# Patient Record
Sex: Female | Born: 1940
Health system: Southern US, Community
[De-identification: ages and names within clinical notes are randomized; demographics above are authoritative.]

## PROBLEM LIST (undated history)

## (undated) DIAGNOSIS — K2210 Ulcer of esophagus without bleeding: Secondary | ICD-10-CM

## (undated) DIAGNOSIS — I10 Essential (primary) hypertension: Secondary | ICD-10-CM

## (undated) DIAGNOSIS — M1990 Unspecified osteoarthritis, unspecified site: Secondary | ICD-10-CM

## (undated) DIAGNOSIS — K219 Gastro-esophageal reflux disease without esophagitis: Secondary | ICD-10-CM

## (undated) DIAGNOSIS — K648 Other hemorrhoids: Secondary | ICD-10-CM

## (undated) HISTORY — DX: Ulcer of esophagus without bleeding: K22.10

## (undated) HISTORY — DX: Other hemorrhoids: K64.8

## (undated) HISTORY — PX: KIDNEY STONE SURGERY: SHX686

## (undated) HISTORY — PX: BUNIONECTOMY: SHX129

## (undated) HISTORY — DX: Essential (primary) hypertension: I10

## (undated) HISTORY — PX: APPENDECTOMY: SHX54

## (undated) HISTORY — PX: HEMORRHOID SURGERY: SHX153

## (undated) HISTORY — PX: SHOULDER SURGERY: SHX246

## (undated) HISTORY — DX: Gastro-esophageal reflux disease without esophagitis: K21.9

---

## 2001-01-02 ENCOUNTER — Emergency Department (HOSPITAL_COMMUNITY): Admission: EM | Admit: 2001-01-02 | Discharge: 2001-01-02 | Payer: Self-pay | Admitting: Emergency Medicine

## 2001-01-02 ENCOUNTER — Encounter: Payer: Self-pay | Admitting: Emergency Medicine

## 2002-02-24 ENCOUNTER — Encounter: Payer: Self-pay | Admitting: Internal Medicine

## 2002-02-24 ENCOUNTER — Ambulatory Visit (HOSPITAL_COMMUNITY): Admission: RE | Admit: 2002-02-24 | Discharge: 2002-02-24 | Payer: Self-pay | Admitting: Internal Medicine

## 2002-12-02 DIAGNOSIS — K648 Other hemorrhoids: Secondary | ICD-10-CM

## 2002-12-02 HISTORY — DX: Other hemorrhoids: K64.8

## 2002-12-08 ENCOUNTER — Ambulatory Visit (HOSPITAL_COMMUNITY): Admission: RE | Admit: 2002-12-08 | Discharge: 2002-12-08 | Payer: Self-pay | Admitting: Internal Medicine

## 2002-12-08 HISTORY — PX: COLONOSCOPY W/ BIOPSIES: SHX1374

## 2003-02-16 ENCOUNTER — Ambulatory Visit (HOSPITAL_COMMUNITY): Admission: RE | Admit: 2003-02-16 | Discharge: 2003-02-16 | Payer: Self-pay | Admitting: Internal Medicine

## 2003-02-16 ENCOUNTER — Encounter: Payer: Self-pay | Admitting: Internal Medicine

## 2003-03-07 ENCOUNTER — Ambulatory Visit (HOSPITAL_COMMUNITY): Admission: RE | Admit: 2003-03-07 | Discharge: 2003-03-07 | Payer: Self-pay | Admitting: Internal Medicine

## 2003-03-07 ENCOUNTER — Encounter: Payer: Self-pay | Admitting: Internal Medicine

## 2004-03-18 ENCOUNTER — Ambulatory Visit (HOSPITAL_COMMUNITY): Admission: RE | Admit: 2004-03-18 | Discharge: 2004-03-18 | Payer: Self-pay | Admitting: Internal Medicine

## 2005-03-24 ENCOUNTER — Ambulatory Visit (HOSPITAL_COMMUNITY): Admission: RE | Admit: 2005-03-24 | Discharge: 2005-03-24 | Payer: Self-pay | Admitting: Internal Medicine

## 2005-03-27 ENCOUNTER — Ambulatory Visit (HOSPITAL_COMMUNITY): Admission: RE | Admit: 2005-03-27 | Discharge: 2005-03-27 | Payer: Self-pay | Admitting: Internal Medicine

## 2006-03-31 ENCOUNTER — Ambulatory Visit (HOSPITAL_COMMUNITY): Admission: RE | Admit: 2006-03-31 | Discharge: 2006-03-31 | Payer: Self-pay | Admitting: Internal Medicine

## 2007-04-13 ENCOUNTER — Ambulatory Visit (HOSPITAL_COMMUNITY): Admission: RE | Admit: 2007-04-13 | Discharge: 2007-04-13 | Payer: Self-pay | Admitting: Internal Medicine

## 2007-04-21 ENCOUNTER — Ambulatory Visit: Payer: Self-pay | Admitting: Internal Medicine

## 2007-04-28 ENCOUNTER — Ambulatory Visit: Payer: Self-pay | Admitting: Internal Medicine

## 2007-04-28 ENCOUNTER — Ambulatory Visit (HOSPITAL_COMMUNITY): Admission: RE | Admit: 2007-04-28 | Discharge: 2007-04-28 | Payer: Self-pay | Admitting: Internal Medicine

## 2007-04-28 HISTORY — PX: ESOPHAGOGASTRODUODENOSCOPY: SHX1529

## 2007-05-05 ENCOUNTER — Ambulatory Visit (HOSPITAL_COMMUNITY): Admission: RE | Admit: 2007-05-05 | Discharge: 2007-05-05 | Payer: Self-pay | Admitting: Internal Medicine

## 2007-08-10 ENCOUNTER — Ambulatory Visit: Payer: Self-pay | Admitting: Internal Medicine

## 2008-05-16 ENCOUNTER — Ambulatory Visit (HOSPITAL_COMMUNITY): Admission: RE | Admit: 2008-05-16 | Discharge: 2008-05-16 | Payer: Self-pay | Admitting: Internal Medicine

## 2008-06-07 ENCOUNTER — Ambulatory Visit: Payer: Self-pay | Admitting: Internal Medicine

## 2009-05-30 ENCOUNTER — Ambulatory Visit (HOSPITAL_COMMUNITY): Admission: RE | Admit: 2009-05-30 | Discharge: 2009-05-30 | Payer: Self-pay | Admitting: Internal Medicine

## 2009-06-26 ENCOUNTER — Encounter: Payer: Self-pay | Admitting: Urgent Care

## 2010-06-04 ENCOUNTER — Ambulatory Visit (HOSPITAL_COMMUNITY): Admission: RE | Admit: 2010-06-04 | Discharge: 2010-06-04 | Payer: Self-pay | Admitting: Internal Medicine

## 2010-06-18 ENCOUNTER — Encounter: Payer: Self-pay | Admitting: Urgent Care

## 2010-07-25 ENCOUNTER — Encounter (INDEPENDENT_AMBULATORY_CARE_PROVIDER_SITE_OTHER): Payer: Self-pay | Admitting: *Deleted

## 2010-08-14 ENCOUNTER — Ambulatory Visit: Payer: Self-pay | Admitting: Internal Medicine

## 2010-08-14 DIAGNOSIS — K219 Gastro-esophageal reflux disease without esophagitis: Secondary | ICD-10-CM | POA: Insufficient documentation

## 2010-08-20 DIAGNOSIS — Z860100 Personal history of colon polyps, unspecified: Secondary | ICD-10-CM | POA: Insufficient documentation

## 2010-10-14 ENCOUNTER — Encounter: Payer: Self-pay | Admitting: Internal Medicine

## 2010-10-22 NOTE — Letter (Signed)
Summary: Recall Office Visit  [facility]  [address]   [place], [place] [address]   Phone: [phone]  Fax: [phone]      July 25, 2010   TASHANNA DOLIN [address] [place], [place]  [address] 27-Feb-1941   Dear Ms. Arboleda,   According to our records, it is time for you to schedule a follow-up office visit with [place].   At your convenience, please call [phone] to schedule an office visit. If you have any questions, concerns, or feel that this letter is in error, we would appreciate your call.   Sincerely,    Diana Eves  [facility] Ph: [phone]   Fax: [phone]

## 2010-10-22 NOTE — Assessment & Plan Note (Signed)
Summary: FU/SS   Visit Type:  Follow-up Visit Primary Care Provider:  Ouida Sills  Chief Complaint:  F/U .  History of Present Illness:  70 year old lady here for followup. History of ulcerative reflux esophagitis. No Barrett's esophagus. Ran out of AcipHex 2-3 months ago. Called in for a followup appointment but did not let us know she was out of medication. She's been on Prilosec in the interim, symptoms not nearly as well-controlled on this agent. No dysphagia, melena early satiety nausea or vomiting. Her weight has been stable. She is due for a surveillance colonoscopy in 2014.  Current Medications (verified): 1)  Amlodipine Besylate 5 Mg Tabs (Amlodipine Besylate) .... Take 1 Tablet By Mouth Once A Day 2)  Mag Oxide .... Take 1 Tablet By Mouth Once A Day 3)  Vitamin E .... Take 1 Tablet By Mouth Once A Day 4)  Prilosec 20 Mg Cpdr (Omeprazole) .... Take 1 Tablet By Mouth Once A Day 5)  Lisinopril .... Take 1 Tablet By Mouth Once A Day 6)  Advil .... As Needed  Allergies (verified): 1)  ! Naprosyn 2)  ! * Feldine  Past History:  Family History: Last updated: 2010-08-25 Father: Deceased age 70    CVA/Pneumonia Mother: Deceased age 64 CAD Siblings: 3      one sister in nursing home/dementia  Social History: Last updated: 2010-08-25 Marital Status: Married Children: one Occupation: Retired    [employer]  Past Medical History: Hypertension  Reflux  Past Surgical History: Hemorrhoidectomy Appendectomy Kidney Stones Bilateral Bunion surgeries X 5 Left Shoulder Bone Spur  Family History: Father: Deceased age 32    CVA/Pneumonia Mother: Deceased age 87 CAD Siblings: 3      one sister in nursing home/dementia  Social History: Marital Status: Married Children: one Occupation: Retired    [employer]al architect [employer]  Vital Signs:  Patient profile:   70 year old female Height:      65.5 inches Weight:      180.50 pounds BMI:     29.69 Temp:     98.8 degrees F oral BP  sitting:   138 / 70  (left arm) Cuff size:   large  Vitals Entered By: Cloria Spring LPN (2010/08/25 1:59 PM)  Physical Exam  General:  pleasant alert lady resting comfortably Lungs:  clear to auscultation Heart:  regular rate rhythm without murmur gallop rub Abdomen:  flat positive bowel sounds soft, nontender without appreciable mass or organomegaly  Impression & Recommendations: Impression: 70 year old lady with history of complicated GERD with poor response to Prilosec in lieu of AcipHex. No alarm symptoms.  I feel we simply need to get her back on AcipHex.  Recommendations: Resume AcipHex 20 mg orally daily albuterol one year prescription and a prescription for 14 free tablets with a drug company coupon; reviewed antireflux lifestyle/diet.  We'll plan to see this nice lady back in one year. Tentatively plan for repeat colonoscopy 2014.  Appended Document: Orders Update    Clinical Lists Changes  Problems: Added new problem of GERD (ICD-530.81) Added new problem of COLONIC POLYPS, HX OF (ICD-V12.72) Orders: Added new Service order of Est. Patient Level III ([address]) - Signed

## 2010-10-22 NOTE — Medication Information (Signed)
Summary: ACIPHEX  ACIPHEX   Imported By: Rexene Alberts 06/18/2010 10:19:14  _____________________________________________________________________  External Attachment:    Type:   Image     Comment:   External Document  Appended Document: ACIPHEX Pt due for 13yr FU or can get from PCP.  Appended Document: ACIPHEX pharmacy informed  Appended Document: ACIPHEX Pt called to schedule but RMR is completely booked for Oct- need to call pt back when Nov schedule is open- cdg

## 2011-02-04 NOTE — Assessment & Plan Note (Signed)
Tricia Jenkins, Tricia Jenkins                 CHART#:  [id number]   DATE:  08/10/2007                       DOB:  15-Sep-1941   Followup EGD revealing mild rest reflux esophagitis and small hiatal  hernia, April 28, 2007.   Tricia Jenkins has done very well.  Continue on AcipHex and cutting back on  her junk food intake late in the evening.  She is not having any  dysphagia.  She is very happy with AcipHex 20 mg orally daily.  She has,  however, gained 7 pounds since I last saw her in the office, back in  July.  Overall, she is doing very well.  She had essentially negative  colonoscopy in 2004 and will be due for routine screening in 2014.   CURRENT MEDICATIONS:  See updated list.   ALLERGIES:  FELDENE, NAPROSYN.   EXAM TODAY:  She looks well.  Weight 187, height 5 feet 3, temperature 97.6, BP 140/98, pulse 76.  Detailed exam deferred.   ASSESSMENT:  History of erosive reflux esophagitis, much better on  aspiration therapy with AcipHex and dietary modification.  I told Ms.  Jenkins it would be a good idea if she could turn things around and  actually lose down into the low 170s between now and 12 months from now.  Anti-reflux diet emphasized.   We will plan to see the patient back tentatively in one year and p.r.n.       R. Roetta Sessions, M.D.  Electronically Signed     RMR/MEDQ  D:  08/10/2007  T:  08/10/2007  Job:  [id number]   cc:   Kingsley Callander. Ouida Sills, MD

## 2011-02-04 NOTE — Assessment & Plan Note (Signed)
Tricia Jenkins, Tricia Jenkins                 CHART#:  [id number]   DATE:  06/07/2008                       DOB:  03/06/1941   PRIMARY CARE PHYSICIAN:  Kingsley Callander. Ouida Sills, MD   PROBLEM LIST:  1. Complicated gastroesophageal reflux disease with history of erosive      reflux esophagitis and small hiatal hernia with last EGD by Dr.      Jena Gauss on April 28 2007.  2. Nephrolithiasis, status post surgery.  3. Status post appendectomy.  4. Status post hemorrhoidectomy.  5. A 2004 colonoscopy, petechial hemorrhage to the ileocecal valve and      benign polyp.   SUBJECTIVE:  The patient is here for a followup visit.  She is doing  very well on Aciphex 20 mg daily.  She denies any heartburn,  indigestion, nausea, vomiting, dysphagia, or odynophagia.  Denies  anorexia.  Denies any abdominal pain, rectal bleeding, or melena.  She  rarely has breakthrough heartburn and indigestion and if she does it is  usually related to her nighttime snacking.  She tends to eat junk food  just prior to going to bed.  She is scheduled for complete physical exam  by Dr. Ouida Sills next month.   CURRENT MEDICATIONS:  See the list of June 07, 2008.   ALLERGIES:  Feldene and Naprosyn.   OBJECTIVE:  VITAL SIGNS:  Weight 180 pounds, height 6 feet and 5-1/2  inches, temperature 98, blood pressure 150/80, and pulse 68.  GENERAL:  The patient is a well-developed, well-nourished Caucasian  female, who is alert, oriented, pleasant, and cooperative in no acute  distress.  HEENT:  Sclerae clear, nonicteric.  Conjunctivae pink.  Oropharynx pink  and moist without any lesions.  CHEST:  Heart, regular rate and rhythm.  Normal S1 and S2.  ABDOMEN:  Protuberant with positive bowel sounds x4.  No bruits  auscultated.  Soft, nontender, and nondistended without palpable mass or  hepatosplenomegaly.  No rebound, tenderness, or guarding.  EXTREMITIES:  Without clubbing or edema.   ASSESSMENT:  History of erosive reflux esophagitis/chronic  gastroesophageal reflux disease, well controlled on PPI.   PLAN:  1. Continue Aciphex 20 mg daily, #31 with 11 refills.  2. Colonoscopy in March 2014 or sooner if she has any problems.  3. Office visit in 2 years with Dr. Jena Gauss.       Lorenza Burton, N.P.  Electronically Signed     R. Roetta Sessions, M.D.  Electronically Signed    KJ/MEDQ  D:  06/08/2008  T:  06/08/2008  Job:  [id number]   cc:   Kingsley Callander. Ouida Sills, MD

## 2011-02-04 NOTE — Op Note (Signed)
NAMEMEGHAM, DWYER                ACCOUNT NO.:  [id number]   MEDICAL RECORD NO.:  [id number]          PATIENT TYPE:  AMB   LOCATION:  DAY                           FACILITY:  [facility]   PHYSICIAN:  R. Roetta Sessions, M.D. DATE OF BIRTH:  1941-05-12   DATE OF PROCEDURE:  04/28/2007  DATE OF DISCHARGE:                               OPERATIVE REPORT   PROCEDURE PERFORMED:  Diagnostic esophagogastroduodenoscopy.   INDICATIONS FOR PROCEDURE:  70 year old lady with refractory  gastroesophageal reflux disease symptoms historically well controlled on  Prilosec.  She had refractory symptoms recently.  I saw her in our  office on April 21, 2007, and started her on some AcipHex 20 mg orally  daily.  This has been associated with marked improvement in her  symptoms.  She has no symptoms consistent with GI bleeding and has not  had any odynophagia or dysphagia.  EGD is now being done to further  evaluate her symptoms.  This approach has been discussed with the  patient at length.  The potential risks, benefits and alternatives have  been reviewed and questions answered.  Please see documentation in the  medical record.   PROCEDURE NOTE:  O2 saturation, blood pressure, pulse, and respirations  were monitored throughout the entire procedure.  Conscious sedation with  Versed 4 mg IV and Demerol 75 mg IV in divided doses.  Pentax video chip  system.  Cetacaine spray for topical oropharyngeal anesthesia.   FINDINGS:  Examination of the tubular esophagus revealed a couple of  distal esophageal erosions.  There was no Barrett's esophagus or other  abnormality.  The EG junction was easily traversed (erosions were small,  no more than 6-7 mm in length).  The gastric cavity was empty and  insufflated well with air.  A thorough examination of the gastric mucosa  including retroflexion of the proximal stomach esophagogastric junction  demonstrated only a small hiatal hernia.  The pylorus was patent and  easily  traversed.  Examination of the bulb and second portion revealed  no abnormalities.   THERAPEUTIC/DIAGNOSTIC MANEUVERS PERFORMED:  None.   The patient tolerated the procedure well and was reacted in endoscopy.   IMPRESSION:  Distal esophageal erosions consistent with mild to moderate  reflux esophagitis, otherwise, normal esophagus, small hiatal hernia;  otherwise, normal stomach, first and second duodenum.   RECOMMENDATIONS:  Antireflux measures reviewed with Ms. Blaker and  literature provided.  She really needs to stop eating junk food right  before going to bed. She should really not eat within three hours of  going to bed. Continue AcipHex 20 mg orally daily, prescription given.  Follow-up appointment with [place] in three months.      Jonathon Bellows, M.D.  Electronically Signed     RMR/MEDQ  D:  04/28/2007  T:  04/28/2007  Job:  [id number]   cc:   Kingsley Callander. Ouida Sills, MD  Fax: [phone]

## 2011-02-04 NOTE — H&P (Signed)
Tricia Jenkins, Tricia Jenkins                ACCOUNT NO.:  [id number]   MEDICAL RECORD NO.:  [id number]          PATIENT TYPE:  AMB   LOCATION:  DAY                           FACILITY:  [facility]   PHYSICIAN:  R. Roetta Sessions, M.D. DATE OF BIRTH:  1941/09/09   DATE OF ADMISSION:  DATE OF DISCHARGE:  LH                              HISTORY & PHYSICAL   CHIEF COMPLAINT:  Refractory reflux symptoms.   PRIMARY CARE PHYSICIAN:  Dr. Carylon Perches.   HISTORY OF PRESENT ILLNESS:  Tricia Jenkins is a very pleasant, 70 year old  lady with longstanding gastroesophageal reflux disease historically well-  controlled on Prilosec 20 mg orally daily over the past 3 months. She  tells me that her reflux symptoms have returned.  She has typical  retrosternal burning, worse in the evenings and through the night.  She  has not had any odynophagia or dysphagia.  She does sometimes have a  practice of eating some candy or popcorn just before going to bed. Her  weight is actually down 8 pounds since she was seen lastly here in 2003.  She has not had any melena or rectal bleeding. Screening colonoscopy  back in 2004 demonstrated some petechial hemorrhages to the ileocecal  valve and a benign appearing polyp. Biopsies were nonspecific (suspect  abnormalities related to NSAID use).  She is not due for screening until  2014.   She has not had any abdominal pain, no nausea, vomiting, no early  satiety.  She stopped smoking Christmas 2007. No other tobacco use, no  alcohol consumption.   Her other medications include __________ and magnesium oxide.   PAST MEDICAL HISTORY:  Significant for nephrolithiasis.   PAST SURGERIES:  Kidney stone surgery, appendectomy, hemorrhoidectomy,  bunionectomy, left shoulder surgery. Her last EGD was in 1998 and  revealed some mild erosive reflux esophagitis. Colonoscopy in 2004 as  outlined above.   MEDICATION ALLERGIES:  FELDENE, NAPROSYN.   FAMILY HISTORY:  Father died age 38 with a stroke.   Mother died age 4  with Alzheimer's related complications, otherwise no history of chronic  GI or liver illness.   SOCIAL HISTORY:  The patient is married, one son is deceased. She is  retired. Again currently no tobacco or alcohol use.   REVIEW OF SYSTEMS:  No chest pain, no dyspnea on exertion.  No fever,  chills, otherwise as in history of present illness.   PHYSICAL EXAMINATION:  GENERAL:  A pleasant 66-year lady resting  comfortably, weight 180, height 5 foot 5-1/12 inches, temperature 97.8,  blood pressure 126/78, pulse 80.  SKIN:  Warm and dry.  There is no jaundice.  HEENT:  No scleral icterus.  JVD is not prominent.  CHEST:  Lungs are clear to auscultation.  CARDIAC:  Regular rate and rhythm without murmur, gallop or rub.  ABDOMEN:  Nondistended.  Positive bowel sounds, soft, entirely nontender  without appreciable mass or organomegaly.  EXTREMITIES:  No edema.   IMPRESSION:  Tricia Jenkins is a pleasant 70 year old lady with a long  history of gastroesophageal reflux disease historically well-controlled  on Prilosec. Over  the past 3 months she has had recurrence of nearly  daily symptoms in spite of taking Prilosec daily.  She does consume junk  food prior to going to bed. She has actually lost weight which is a  little discordant with the onset of reflux symptoms at this time.   RECOMMENDATIONS:  Given the present clinical scenario, I feel the best  course of action would be to go ahead and repeat a diagnostic EGD at  this time.  The potential risks, benefits and alternatives have been  reviewed, questions answered. I provided her literature on  gastroesophageal reflux disease and told her she should not eat any food  really within 3 hours of going to bed.   I have also given her a 2 week supply of AcipHex 20 mg tablets, one each  morning before breakfast to substitute for Prilosec. I will make further  recommendations once the endoscopic evaluation has been  carried out.      Jonathon Bellows, M.D.  Electronically Signed     RMR/MEDQ  D:  04/21/2007  T:  04/21/2007  Job:  [id number]   cc:   Kingsley Callander. Ouida Sills, MD  Fax: [phone]

## 2011-02-07 NOTE — Op Note (Signed)
Tricia Jenkins, Tricia Jenkins                          ACCOUNT NO.:  [id number]   MEDICAL RECORD NO.:  [id number]                   PATIENT TYPE:  AMB   LOCATION:  DAY                                  FACILITY:  [facility]   PHYSICIAN:  R. Roetta Sessions, M.D.              DATE OF BIRTH:  [birth year]   DATE OF PROCEDURE:  12/08/2002  DATE OF DISCHARGE:                                 OPERATIVE REPORT   PROCEDURE:  Colonoscopy with biopsy.   ENDOSCOPIST:  Gerrit Friends. Rourk, M.D.   INDICATIONS FOR PROCEDURE:  The patient is a 70 year old lady referred for  colorectal cancer screening by Dr. Ouida Sills.  She is devoid of any lower GI  tract symptoms.  No family history of colorectal neoplasia.  She has had  sigmoidoscopy by Dr. Ouida Sills previously without any abnormalities being found.  Colonoscopy is now being done as a screening maneuver.  This approach has  been discussed with the patient previously.  The potential risks, benefits,  and alternatives have been reviewed, questions answered.  She is agreeable.  Please see my handwritten H&P for more information.   MONITORING:  O2 saturation, blood pressure, pulse and respirations were  monitored throughout the entire procedure.  Conscious sedation: __________  IV in divided doses.   INSTRUMENT:  Olympus video chip adult colonoscope.   FINDINGS:  Digital rectal exam revealed no abnormalities.   ENDOSCOPIC FINDINGS:  The prep was good.   RECTUM:  Examination of the rectal mucosa including the retroflex view of  the anal verge revealed only internal hemorrhoids.   COLON:  The colonic mucosa was surveyed from the rectosigmoid junction  through the left transverse and right colon to the area of the appendiceal  orifice, ileocecal valve, and cecum.  These structures were well seen and  photographed for the record.  The patient had a 5 mm polyp at the  appendiceal remnant.  Also the ileocecal valve was somewhat knobby and had  petechiae covering it.  There  do not appear to be any adenomatous or  neoplastic process. The polyp was cold biopsied/removed totally with cold  biopsy forceps.  The ileocecal valve was biopsied.   From this level the scope was slowly withdrawn.  All previously mentioned  mucosal surfaces were again seen; no other abnormalities were observed.  The  patient tolerated the procedure well and was reacted in endoscopy.   IMPRESSION:  1. Internal hemorrhoids, otherwise normal rectum.  2. Polyp at endocecum cold biopsied/removed.  3. Abnormal appearing ileocecal valve (of doubtful clinical significance)     biopsied.    RECOMMENDATIONS:  1. Will follow up on path.  2. Further recommendations to follow.  Jonathon Bellows, M.D.    RMR/MEDQ  D:  12/08/2002  T:  12/08/2002  Job:  [id number]   cc:   Kingsley Callander. Ouida Sills, M.D.  [address]  [place]  [place] [address]  Fax: [phone]

## 2011-06-10 ENCOUNTER — Other Ambulatory Visit (HOSPITAL_COMMUNITY): Payer: Self-pay | Admitting: Internal Medicine

## 2011-06-10 DIAGNOSIS — Z139 Encounter for screening, unspecified: Secondary | ICD-10-CM

## 2011-06-17 ENCOUNTER — Ambulatory Visit (HOSPITAL_COMMUNITY)
Admission: RE | Admit: 2011-06-17 | Discharge: 2011-06-17 | Disposition: A | Discharge status: Home / Self Care | Payer: Medicare Other | Source: Ambulatory Visit | Attending: Internal Medicine | Admitting: Internal Medicine

## 2011-06-17 DIAGNOSIS — Z1231 Encounter for screening mammogram for malignant neoplasm of breast: Secondary | ICD-10-CM | POA: Insufficient documentation

## 2011-06-17 DIAGNOSIS — Z139 Encounter for screening, unspecified: Secondary | ICD-10-CM

## 2011-06-19 ENCOUNTER — Encounter: Payer: Self-pay | Admitting: Internal Medicine

## 2011-07-17 ENCOUNTER — Ambulatory Visit: Payer: Medicare Other | Admitting: Internal Medicine

## 2011-07-18 ENCOUNTER — Encounter: Payer: Self-pay | Admitting: Internal Medicine

## 2011-07-21 ENCOUNTER — Ambulatory Visit (INDEPENDENT_AMBULATORY_CARE_PROVIDER_SITE_OTHER): Payer: Medicare Other | Admitting: Internal Medicine

## 2011-07-21 ENCOUNTER — Ambulatory Visit: Payer: Medicare Other | Admitting: Internal Medicine

## 2011-07-21 ENCOUNTER — Encounter: Payer: Self-pay | Admitting: Internal Medicine

## 2011-07-21 VITALS — BP 134/67 | HR 63 | Temp 97.3°F | Ht 65.0 in | Wt 184.6 lb

## 2011-07-21 DIAGNOSIS — K219 Gastro-esophageal reflux disease without esophagitis: Secondary | ICD-10-CM

## 2011-07-21 NOTE — Patient Instructions (Signed)
Continue Aciphex; when insurance changes let us know.  Coupon for free aciphex provided  Genella Rife literature  F/u 1 year   tcs 2014

## 2011-07-21 NOTE — Assessment & Plan Note (Signed)
GERD symptoms well controlled on AcipHex. She will likely need to change proton pump inhibitors first of the year. I've given her a two-week prescription for AcipHex with a coupon for free medication to help her make the transition.  Once she knows what her new benefits will be, she is to let us know and we will select a PPI.  Office visit in one year.  Screening colonoscopy in 2014.

## 2011-07-21 NOTE — Progress Notes (Signed)
Primary Care Physician:  Carylon Perches, MD Primary Gastroenterologist:  Dr.   Pre-Procedure History & Physical: HPI:  Tricia Jenkins is a 70 y.o. female here for GERD. History of ulcerative reflux esophagitis.  EGD 2007. Symptoms well controlled on AcipHex 20 mg orally daily.  No dysphagia or GI bleeding. Patient tells me insurance is changing the first of the year and they will no longer pay well for AcipHex. Previously failed omeprazole. She'll be due for average risk screening colonoscopy 2014  Past Medical History  Diagnosis Date  . HTN (hypertension)   . GERD (gastroesophageal reflux disease)   . Erosive esophagitis   . Internal hemorrhoids 12/02/2002    Past Surgical History  Procedure Date  . Appendectomy   . Kidney stone surgery   . Shoulder surgery     left  . Hemorrhoid surgery   . Bunionectomy     bilateral x5  . Colonoscopy w/ biopsies 12/08/2002    cecum bx- chronic active colitis with significant lymphocytic infiltrate within the  submucosa,  ileocecal valve bx- mildly ifflamed and focally edematous colonic  and ileal mucosal fragments.     Prior to Admission medications   Medication Sig Start Date End Date Taking? Authorizing Provider  ACIPHEX 20 MG tablet Take 20 mg by mouth daily.  06/25/11  Yes Historical Provider, MD  amLODipine (NORVASC) 5 MG tablet Take 5 mg by mouth daily.     Yes Historical Provider, MD  ibuprofen (ADVIL,MOTRIN) 200 MG tablet Take 200 mg by mouth every 6 (six) hours as needed.     Yes Historical Provider, MD  lisinopril (PRINIVIL,ZESTRIL) 10 MG tablet Take 10 mg by mouth daily.     Yes Historical Provider, MD  magnesium oxide (MAG-OX) 400 MG tablet Take 400 mg by mouth daily.     Yes Historical Provider, MD  vitamin E 1000 UNIT capsule Take 1,000 Units by mouth daily.     Yes Historical Provider, MD  omeprazole (PRILOSEC) 20 MG capsule Take 20 mg by mouth daily.      Historical Provider, MD    Allergies as of 07/21/2011 - Review Complete  07/21/2011  Allergen Reaction Noted  . Naproxen    . Piroxicam      No family history on file.  History   Social History  . Marital Status: Married    Spouse Name: N/A    Number of Children: N/A  . Years of Education: N/A   Occupational History  . Not on file.   Social History Main Topics  . Smoking status: Never Smoker   . Smokeless tobacco: Not on file  . Alcohol Use: No  . Drug Use: No  . Sexually Active: Not on file   Other Topics Concern  . Not on file   Social History Narrative  . No narrative on file    Review of Systems: See HPI, otherwise negative ROS  Physical Exam: BP 134/67  Pulse 63  Temp(Src) 97.3 F (36.3 C) (Temporal)  Ht 5\' 5"  (1.651 m)  Wt 184 lb 9.6 oz (83.734 kg)  BMI 30.72 kg/m2 General:   Alert,  Well-developed, well-nourished, pleasant and cooperative in NAD Head:  Normocephalic and atraumatic. Eyes:  Sclera clear, no icterus.   Conjunctiva pink. Ears:  Normal auditory acuity. Nose:  No deformity, discharge,  or lesions. Mouth:  No deformity or lesions, dentition normal. Neck:  Supple; no masses or thyromegaly. Lungs:  Clear throughout to auscultation.   No wheezes, crackles, or rhonchi. No acute  distress. Heart:  Regular rate and rhythm; no murmurs, clicks, rubs,  or gallops. Abdomen:  Soft, nontender and nondistended. No masses, hepatosplenomegaly or hernias noted. Normal bowel sounds, without guarding, and without rebound.

## 2011-08-12 ENCOUNTER — Telehealth: Payer: Self-pay

## 2011-08-12 MED ORDER — PANTOPRAZOLE SODIUM 40 MG PO TBEC: 40.0000 mg | DELAYED_RELEASE_TABLET | Freq: Every day | ORAL | Status: DC

## 2011-08-12 NOTE — Telephone Encounter (Signed)
Spoke with Mardelle Matte and he said he would put it on her profile for January.

## 2011-08-12 NOTE — Telephone Encounter (Signed)
Pt called- her insurance is not going to pay for aciphex anymore after December. Pt checked with her insurance and they will pay for protonix. Pt is requesting rx sent to [facility] pharmacy but she doesn't need it until January. Informed pt I would call it in if ok and have them put it on profile for her until January. Please advise.

## 2011-08-20 ENCOUNTER — Other Ambulatory Visit: Payer: Self-pay

## 2011-08-20 MED ORDER — RABEPRAZOLE SODIUM 20 MG PO TBEC: 20.0000 mg | DELAYED_RELEASE_TABLET | Freq: Every day | ORAL | Status: DC

## 2011-10-07 DIAGNOSIS — I10 Essential (primary) hypertension: Secondary | ICD-10-CM | POA: Diagnosis not present

## 2011-10-22 ENCOUNTER — Telehealth: Payer: Self-pay

## 2011-10-22 MED ORDER — OMEPRAZOLE 20 MG PO CPDR: 20.0000 mg | DELAYED_RELEASE_CAPSULE | Freq: Every day | ORAL | Status: DC

## 2011-10-22 NOTE — Telephone Encounter (Signed)
We can try omeprazole. She should let us know if any problems.

## 2011-10-22 NOTE — Telephone Encounter (Signed)
Pt came by office with letter from [employer]- now they dont want to pay for pantoprazole. They said she could take omeprazole 20mg  and would not have a copay or nexium and pay 34.00 a month. Pt stated she tried prilosec several years ago but she would like to try it again and if it doesn't work she would like to change to nexium.   Pt is requesting rx for omeprazole sent to [facility] pharmacy.

## 2011-11-12 DIAGNOSIS — R350 Frequency of micturition: Secondary | ICD-10-CM | POA: Diagnosis not present

## 2011-11-12 DIAGNOSIS — N3946 Mixed incontinence: Secondary | ICD-10-CM | POA: Diagnosis not present

## 2012-01-21 DIAGNOSIS — H40059 Ocular hypertension, unspecified eye: Secondary | ICD-10-CM | POA: Diagnosis not present

## 2012-03-20 DIAGNOSIS — G510 Bell's palsy: Secondary | ICD-10-CM | POA: Diagnosis not present

## 2012-03-20 DIAGNOSIS — R29810 Facial weakness: Secondary | ICD-10-CM | POA: Diagnosis not present

## 2012-03-22 DIAGNOSIS — G510 Bell's palsy: Secondary | ICD-10-CM | POA: Diagnosis not present

## 2012-04-07 DIAGNOSIS — Z79899 Other long term (current) drug therapy: Secondary | ICD-10-CM | POA: Diagnosis not present

## 2012-04-07 DIAGNOSIS — E781 Pure hyperglyceridemia: Secondary | ICD-10-CM | POA: Diagnosis not present

## 2012-04-07 DIAGNOSIS — M1990 Unspecified osteoarthritis, unspecified site: Secondary | ICD-10-CM | POA: Diagnosis not present

## 2012-04-07 DIAGNOSIS — I10 Essential (primary) hypertension: Secondary | ICD-10-CM | POA: Diagnosis not present

## 2012-04-13 DIAGNOSIS — I10 Essential (primary) hypertension: Secondary | ICD-10-CM | POA: Diagnosis not present

## 2012-04-13 DIAGNOSIS — N200 Calculus of kidney: Secondary | ICD-10-CM | POA: Diagnosis not present

## 2012-04-13 DIAGNOSIS — Z1212 Encounter for screening for malignant neoplasm of rectum: Secondary | ICD-10-CM | POA: Diagnosis not present

## 2012-04-13 DIAGNOSIS — G510 Bell's palsy: Secondary | ICD-10-CM | POA: Diagnosis not present

## 2012-04-13 DIAGNOSIS — K219 Gastro-esophageal reflux disease without esophagitis: Secondary | ICD-10-CM | POA: Diagnosis not present

## 2012-06-02 ENCOUNTER — Other Ambulatory Visit (HOSPITAL_COMMUNITY): Payer: Self-pay | Admitting: Internal Medicine

## 2012-06-02 DIAGNOSIS — Z139 Encounter for screening, unspecified: Secondary | ICD-10-CM

## 2012-06-14 ENCOUNTER — Encounter: Payer: Self-pay | Admitting: Internal Medicine

## 2012-06-22 ENCOUNTER — Ambulatory Visit (HOSPITAL_COMMUNITY)
Admission: RE | Admit: 2012-06-22 | Discharge: 2012-06-22 | Disposition: A | Discharge status: Home / Self Care | Payer: Medicare Other | Source: Ambulatory Visit | Attending: Internal Medicine | Admitting: Internal Medicine

## 2012-06-22 DIAGNOSIS — Z139 Encounter for screening, unspecified: Secondary | ICD-10-CM

## 2012-06-22 DIAGNOSIS — Z1231 Encounter for screening mammogram for malignant neoplasm of breast: Secondary | ICD-10-CM | POA: Diagnosis not present

## 2012-07-20 ENCOUNTER — Encounter: Payer: Self-pay | Admitting: Internal Medicine

## 2012-07-20 ENCOUNTER — Ambulatory Visit (INDEPENDENT_AMBULATORY_CARE_PROVIDER_SITE_OTHER): Payer: Medicare Other | Admitting: Internal Medicine

## 2012-07-20 VITALS — BP 100/60 | HR 65 | Temp 98.5°F | Ht 65.5 in | Wt 180.0 lb

## 2012-07-20 DIAGNOSIS — K219 Gastro-esophageal reflux disease without esophagitis: Secondary | ICD-10-CM | POA: Diagnosis not present

## 2012-07-20 DIAGNOSIS — Z23 Encounter for immunization: Secondary | ICD-10-CM | POA: Diagnosis not present

## 2012-07-20 NOTE — Patient Instructions (Signed)
GERD information  Continue Prilosec 20 mg daily  Screening Colonoscopy January 2013

## 2012-07-20 NOTE — Progress Notes (Signed)
Primary Care Physician:  Carylon Perches, MD Primary Gastroenterologist:  Dr. Jena Gauss Pre-Procedure History & Physical: HPI:  Tricia Jenkins is a 71 y.o. female here for followup of history of ulcerative reflux esophagitis. She is now on Prilosec 20 mg orally daily. This works fairly well but not quite as good as AcipHex previously. She states she might have 3 or 4 episodes of reflux in one month.. No dysphagia no melena, etc. Last colonoscopy 2004 demonstrated diffuse proctocolitis of uncertain significance. She was exposed to significant NSAIDs that time. However, she had no GI symptoms related Takes  Aleve/Advil  for occasional aches and pains. No family history of colon polyps or colon cancer. Past Medical History  Diagnosis Date  . HTN (hypertension)   . GERD (gastroesophageal reflux disease)   . Erosive esophagitis   . Internal hemorrhoids 12/02/2002    Past Surgical History  Procedure Date  . Appendectomy   . Kidney stone surgery   . Shoulder surgery     left  . Hemorrhoid surgery   . Bunionectomy     bilateral x5  . Colonoscopy w/ biopsies 12/08/2002    Dr. Vickey Huger bx- chronic active colitis with significant lymphocytic infiltrate within the  submucosa,  ileocecal valve bx- mildly ifflamed and focally edematous colonic  and ileal mucosal fragments.     Prior to Admission medications   Medication Sig Start Date End Date Taking? Authorizing Provider  amLODipine (NORVASC) 5 MG tablet Take 5 mg by mouth daily.     Yes Historical Provider, MD  ibuprofen (ADVIL,MOTRIN) 200 MG tablet Take 200 mg by mouth every 6 (six) hours as needed.     Yes Historical Provider, MD  lisinopril (PRINIVIL,ZESTRIL) 10 MG tablet Take 10 mg by mouth daily.     Yes Historical Provider, MD  magnesium oxide (MAG-OX) 400 MG tablet Take 400 mg by mouth daily.     Yes Historical Provider, MD  omeprazole (PRILOSEC) 20 MG capsule Take 1 capsule (20 mg total) by mouth daily. 10/22/11  Yes Tiffany Kocher, PA  vitamin E  1000 UNIT capsule Take 1,000 Units by mouth daily.      Historical Provider, MD    Allergies as of 07/20/2012 - Review Complete 07/21/2011  Allergen Reaction Noted  . Naproxen    . Piroxicam      No family history on file.  History   Social History  . Marital Status: Married    Spouse Name: N/A    Number of Children: N/A  . Years of Education: N/A   Occupational History  . Not on file.   Social History Main Topics  . Smoking status: Never Smoker   . Smokeless tobacco: Not on file  . Alcohol Use: No  . Drug Use: No  . Sexually Active: Not on file   Other Topics Concern  . Not on file   Social History Narrative  . No narrative on file    Review of Systems: See HPI, otherwise negative ROS  Physical Exam: BP 100/60  Pulse 65  Temp 98.5 F (36.9 C) (Temporal)  Ht 5' 5.5" (1.664 m)  Wt 180 lb (81.647 kg)  BMI 29.50 kg/m2 General:   Alert,  Well-developed, well-nourished, pleasant and cooperative in NAD Skin:  Intact without significant lesions or rashes. Eyes:  Sclera clear, no icterus.   Conjunctiva pink. Ears:  Normal auditory acuity. Nose:  No deformity, discharge,  or lesions. Mouth:  No deformity or lesions. Neck:  Supple; no masses or thyromegaly.  No significant cervical adenopathy. Lungs:  Clear throughout to auscultation.   No wheezes, crackles, or rhonchi. No acute distress. Heart:  Regular rate and rhythm; no murmurs, clicks, rubs,  or gallops. Abdomen: Non-distended, normal bowel sounds.  Soft and nontender without appreciable mass or hepatosplenomegaly.  Pulses:  Normal pulses noted. Extremities:  Without clubbing or edema.  Impression/Plan:  71 year old lady with a history of complicated GERD well-controlled on Prilosec. She has relatively rare breakthrough episodes. No alarm symptoms . Discussed the multipronged  approach regarding the management of acid reflux disease. Discussed the risk of long-term acid suppression therapy. However, in this  setting, I feel the benefits far outweigh any theoretical risks.  Recommendations:  Continue Prilosec 20 mg orally daily. Literature on antireflux measures provided. As a separate issue, we'll make plans to get this nice lady back for a 10 year screening colonoscopy in 2014.  Office visit here in one year

## 2012-08-09 DIAGNOSIS — Z0000 Encounter for general adult medical examination without abnormal findings: Secondary | ICD-10-CM | POA: Diagnosis not present

## 2012-08-24 DIAGNOSIS — J069 Acute upper respiratory infection, unspecified: Secondary | ICD-10-CM | POA: Diagnosis not present

## 2012-08-31 ENCOUNTER — Encounter: Payer: Self-pay | Admitting: *Deleted

## 2012-09-08 ENCOUNTER — Encounter: Payer: Self-pay | Admitting: Internal Medicine

## 2012-09-08 ENCOUNTER — Ambulatory Visit (INDEPENDENT_AMBULATORY_CARE_PROVIDER_SITE_OTHER): Payer: Medicare Other | Admitting: Gastroenterology

## 2012-09-08 VITALS — BP 129/61 | HR 69 | Temp 97.3°F | Ht 65.0 in | Wt 179.8 lb

## 2012-09-08 DIAGNOSIS — K219 Gastro-esophageal reflux disease without esophagitis: Secondary | ICD-10-CM | POA: Diagnosis not present

## 2012-09-08 DIAGNOSIS — K5289 Other specified noninfective gastroenteritis and colitis: Secondary | ICD-10-CM | POA: Diagnosis not present

## 2012-09-08 DIAGNOSIS — Z1211 Encounter for screening for malignant neoplasm of colon: Secondary | ICD-10-CM | POA: Diagnosis not present

## 2012-09-08 DIAGNOSIS — K52832 Lymphocytic colitis: Secondary | ICD-10-CM | POA: Insufficient documentation

## 2012-09-08 MED ORDER — SOD PICOSULFATE-MAG OX-CIT ACD 10-3.5-12 MG-GM-GM PO PACK: 1.0000 | PACK | ORAL | Status: DC

## 2012-09-08 NOTE — Assessment & Plan Note (Signed)
Colonoscopy due at this time. Patient had focal lymphocytic colitis at cecum on TCS in 2004 but not symptomatic. No FH of CRC. She would like to pursue colonoscopy in 09/2012.  I have discussed the risks, alternatives, benefits with regards to but not limited to the risk of reaction to medication, bleeding, infection, perforation and the patient is agreeable to proceed. Written consent to be obtained.  Given her problems taking Golytely before, she is requesting low-volume prep. She will be given Prepopik. She has been instructed to follow directions completely to ensure adequate bowel preparation.

## 2012-09-08 NOTE — Progress Notes (Signed)
Faxed to PCP

## 2012-09-08 NOTE — Patient Instructions (Signed)
We have scheduled you for a colonoscopy with Dr. Rourk. Please see separate instructions. 

## 2012-09-08 NOTE — Progress Notes (Signed)
Primary Care Physician:  FAGAN,ROY, MD  Primary Gastroenterologist:  Michael Rourk, MD   Chief Complaint  Patient presents with  . Colonoscopy    HPI:  Tricia Jenkins is a 71 y.o. female here to schedule 10 year f/u colonoscopy. She has h/o erosive reflux esophagitis. Last seen in office in 06/2012. Her last colonoscopy was in 11/2002. She had abnormal appearing cecum, biopsy showed chronic active colitis with significant lymphocytic infiltrate. Given patient was asymptomatic, no further management at that time. Denies family history of colon cancer. Her GERD is well-controlled overall on Prilosec. She has breakthrough symptoms about twice per month. Denies weight loss, vomiting, dysphagia, abdominal pain, constipation, diarrhea, melena, brbpr. She takes Advil about three times weekly for arthritis.   She states she had very difficult time taking bowel prep back in 2004.   Current Outpatient Prescriptions  Medication Sig Dispense Refill  . amLODipine (NORVASC) 5 MG tablet Take 5 mg by mouth daily.        . ibuprofen (ADVIL,MOTRIN) 200 MG tablet Takes about 400mg 2 -3 times per week for arthritis.      . lisinopril (PRINIVIL,ZESTRIL) 40 MG tablet       . magnesium oxide (MAG-OX) 400 MG tablet Take 400 mg by mouth daily.        . omeprazole (PRILOSEC) 20 MG capsule Take 1 capsule (20 mg total) by mouth daily.  30 capsule  11    Allergies as of 09/08/2012 - Review Complete 09/08/2012  Allergen Reaction Noted  . Naproxen    . Piroxicam      Past Medical History  Diagnosis Date  . HTN (hypertension)   . GERD (gastroesophageal reflux disease)   . Erosive esophagitis   . Internal hemorrhoids 12/02/2002    Past Surgical History  Procedure Date  . Appendectomy   . Kidney stone surgery   . Shoulder surgery     left  . Hemorrhoid surgery   . Bunionectomy     bilateral x5  . Colonoscopy w/ biopsies 12/08/2002    RMR: Internal hemorrhoids, otherwise normal rectum Polyp at endocecum  cold biopsied/removed Abnormal appearing ileocecal valve (of doubtful clinical significance)  biopsy showed chronic active colitis with significant lymphocytic infiltrate  . Esophagogastroduodenoscopy 04/28/2007    RMR: Distal esophageal erosions consistent with mild to moderate  reflux esophagitis, otherwise, normal esophagus, small hiatal hernia; otherwise, normal stomach, first and second duodenum    Family History  Problem Relation Age of Onset  . Pneumonia Father     deceased age 93/also with h/o CVA  . CAD Mother     deceased age 64  . Colon cancer Neg Hx     History   Social History  . Marital Status: Married    Spouse Name: N/A    Number of Children: 1  . Years of Education: N/A   Occupational History  . Retired [employer]    Social History Main Topics  . Smoking status: Never Smoker   . Smokeless tobacco: Not on file  . Alcohol Use: No  . Drug Use: No  . Sexually Active: Not on file   Other Topics Concern  . Not on file   Social History Narrative  . No narrative on file    ROS:  General: Negative for anorexia, weight loss, fever, chills, fatigue, weakness. Eyes: Negative for vision changes.  ENT: Negative for hoarseness, difficulty swallowing , nasal congestion. CV: Negative for chest pain, angina, palpitations, dyspnea on exertion, peripheral edema.    Respiratory: Negative for dyspnea at rest, dyspnea on exertion, cough, sputum, wheezing.  GI: See history of present illness. GU:  Negative for dysuria, hematuria, urinary incontinence, urinary frequency, nocturnal urination.  MS: Negative for joint pain, low back pain.  Derm: Negative for rash or itching.  Neuro: Negative for weakness, abnormal sensation, seizure, frequent headaches, memory loss, confusion.  Psych: Negative for anxiety, depression, suicidal ideation, hallucinations.  Endo: Negative for unusual weight change.  Heme: Negative for bruising or bleeding. Allergy: Negative for rash  or hives.    Physical Examination:  BP 129/61  Pulse 69  Temp 97.3 F (36.3 C) (Temporal)  Ht 5' 5" (1.651 m)  Wt 179 lb 12.8 oz (81.557 kg)  BMI 29.92 kg/m2   General: Well-nourished, well-developed in no acute distress.  Head: Normocephalic, atraumatic.   Eyes: Conjunctiva pink, no icterus. Mouth: Oropharyngeal mucosa moist and pink , no lesions erythema or exudate. Neck: Supple without thyromegaly, masses, or lymphadenopathy.  Lungs: Clear to auscultation bilaterally.  Heart: Regular rate and rhythm, no murmurs rubs or gallops.  Abdomen: Bowel sounds are normal, nontender, nondistended, no hepatosplenomegaly or masses, no abdominal bruits or    hernia , no rebound or guarding.   Rectal: defer Extremities: No lower extremity edema. No clubbing or deformities.  Neuro: Alert and oriented x 4 , grossly normal neurologically.  Skin: Warm and dry, no rash or jaundice.   Psych: Alert and cooperative, normal mood and affect.    

## 2012-09-08 NOTE — Assessment & Plan Note (Signed)
GERD doing well on Prilosec. No alarm symptoms. OV in one year.

## 2012-09-21 ENCOUNTER — Telehealth: Payer: Self-pay

## 2012-09-21 NOTE — Telephone Encounter (Signed)
Ginger triaged per out triage list. But pt had an OV on 09/08/2012 and this was not needed.

## 2012-09-23 ENCOUNTER — Encounter (HOSPITAL_COMMUNITY): Payer: Self-pay | Admitting: Pharmacy Technician

## 2012-10-07 ENCOUNTER — Encounter (HOSPITAL_COMMUNITY)
Admission: RE | Disposition: A | Discharge status: Home / Self Care | Payer: Self-pay | Source: Ambulatory Visit | Attending: Internal Medicine

## 2012-10-07 ENCOUNTER — Encounter (HOSPITAL_COMMUNITY): Payer: Self-pay | Admitting: *Deleted

## 2012-10-07 ENCOUNTER — Ambulatory Visit (HOSPITAL_COMMUNITY)
Admission: RE | Admit: 2012-10-07 | Discharge: 2012-10-07 | Disposition: A | Discharge status: Home / Self Care | Payer: Medicare Other | Source: Ambulatory Visit | Attending: Internal Medicine | Admitting: Internal Medicine

## 2012-10-07 DIAGNOSIS — I10 Essential (primary) hypertension: Secondary | ICD-10-CM | POA: Diagnosis not present

## 2012-10-07 DIAGNOSIS — D129 Benign neoplasm of anus and anal canal: Secondary | ICD-10-CM

## 2012-10-07 DIAGNOSIS — K5730 Diverticulosis of large intestine without perforation or abscess without bleeding: Secondary | ICD-10-CM | POA: Insufficient documentation

## 2012-10-07 DIAGNOSIS — D128 Benign neoplasm of rectum: Secondary | ICD-10-CM | POA: Diagnosis not present

## 2012-10-07 DIAGNOSIS — Z1211 Encounter for screening for malignant neoplasm of colon: Secondary | ICD-10-CM

## 2012-10-07 DIAGNOSIS — D126 Benign neoplasm of colon, unspecified: Secondary | ICD-10-CM | POA: Insufficient documentation

## 2012-10-07 HISTORY — PX: COLONOSCOPY: SHX5424

## 2012-10-07 SURGERY — COLONOSCOPY
Anesthesia: Moderate Sedation

## 2012-10-07 MED ORDER — MEPERIDINE HCL 100 MG/ML IJ SOLN
INTRAMUSCULAR | Status: AC
  Filled 2012-10-07: qty 1

## 2012-10-07 MED ORDER — ONDANSETRON HCL 4 MG/2ML IJ SOLN
INTRAMUSCULAR | Status: DC | PRN
  Administered 2012-10-07: 4 mg via INTRAVENOUS

## 2012-10-07 MED ORDER — SODIUM CHLORIDE 0.45 % IV SOLN
Freq: Once | INTRAVENOUS | Status: AC
  Administered 2012-10-07: 10:00:00 via INTRAVENOUS

## 2012-10-07 MED ORDER — SODIUM CHLORIDE 0.45 % IV SOLN
INTRAVENOUS | Status: DC
  Administered 2012-10-07: 08:00:00 via INTRAVENOUS

## 2012-10-07 MED ORDER — ONDANSETRON HCL 4 MG/2ML IJ SOLN
INTRAMUSCULAR | Status: AC
  Filled 2012-10-07: qty 2

## 2012-10-07 MED ORDER — MEPERIDINE HCL 100 MG/ML IJ SOLN
INTRAMUSCULAR | Status: DC | PRN
  Administered 2012-10-07: 25 mg via INTRAVENOUS
  Administered 2012-10-07: 50 mg via INTRAVENOUS

## 2012-10-07 MED ORDER — MIDAZOLAM HCL 5 MG/5ML IJ SOLN
INTRAMUSCULAR | Status: AC
  Filled 2012-10-07: qty 10

## 2012-10-07 MED ORDER — STERILE WATER FOR IRRIGATION IR SOLN
Status: DC | PRN
  Administered 2012-10-07: 08:00:00

## 2012-10-07 MED ORDER — MIDAZOLAM HCL 5 MG/5ML IJ SOLN
INTRAMUSCULAR | Status: DC | PRN
  Administered 2012-10-07 (×2): 2 mg via INTRAVENOUS
  Administered 2012-10-07: 1 mg via INTRAVENOUS

## 2012-10-07 NOTE — H&P (View-Only) (Signed)
Primary Care Physician:  Carylon Perches, MD  Primary Gastroenterologist:  Roetta Sessions, MD   Chief Complaint  Patient presents with  . Colonoscopy    HPI:  Tricia Jenkins is a 72 y.o. female here to schedule 10 year f/u colonoscopy. She has h/o erosive reflux esophagitis. Last seen in office in 06/2012. Her last colonoscopy was in 11/2002. She had abnormal appearing cecum, biopsy showed chronic active colitis with significant lymphocytic infiltrate. Given patient was asymptomatic, no further management at that time. Denies family history of colon cancer. Her GERD is well-controlled overall on Prilosec. She has breakthrough symptoms about twice per month. Denies weight loss, vomiting, dysphagia, abdominal pain, constipation, diarrhea, melena, brbpr. She takes Advil about three times weekly for arthritis.   She states she had very difficult time taking bowel prep back in 2004.   Current Outpatient Prescriptions  Medication Sig Dispense Refill  . amLODipine (NORVASC) 5 MG tablet Take 5 mg by mouth daily.        Marland Kitchen ibuprofen (ADVIL,MOTRIN) 200 MG tablet Takes about 400mg  2 -3 times per week for arthritis.      Marland Kitchen lisinopril (PRINIVIL,ZESTRIL) 40 MG tablet       . magnesium oxide (MAG-OX) 400 MG tablet Take 400 mg by mouth daily.        Marland Kitchen omeprazole (PRILOSEC) 20 MG capsule Take 1 capsule (20 mg total) by mouth daily.  30 capsule  11    Allergies as of 09/08/2012 - Review Complete 09/08/2012  Allergen Reaction Noted  . Naproxen    . Piroxicam      Past Medical History  Diagnosis Date  . HTN (hypertension)   . GERD (gastroesophageal reflux disease)   . Erosive esophagitis   . Internal hemorrhoids 12/02/2002    Past Surgical History  Procedure Date  . Appendectomy   . Kidney stone surgery   . Shoulder surgery     left  . Hemorrhoid surgery   . Bunionectomy     bilateral x5  . Colonoscopy w/ biopsies 12/08/2002    RMR: Internal hemorrhoids, otherwise normal rectum Polyp at endocecum  cold biopsied/removed Abnormal appearing ileocecal valve (of doubtful clinical significance)  biopsy showed chronic active colitis with significant lymphocytic infiltrate  . Esophagogastroduodenoscopy 04/28/2007    RMR: Distal esophageal erosions consistent with mild to moderate  reflux esophagitis, otherwise, normal esophagus, small hiatal hernia; otherwise, normal stomach, first and second duodenum    Family History  Problem Relation Age of Onset  . Pneumonia Father     deceased age 58/also with h/o CVA  . CAD Mother     deceased age 65  . Colon cancer Neg Hx     History   Social History  . Marital Status: Married    Spouse Name: N/A    Number of Children: 1  . Years of Education: N/A   Occupational History  . Retired [employer]    Social History Main Topics  . Smoking status: Never Smoker   . Smokeless tobacco: Not on file  . Alcohol Use: No  . Drug Use: No  . Sexually Active: Not on file   Other Topics Concern  . Not on file   Social History Narrative  . No narrative on file    ROS:  General: Negative for anorexia, weight loss, fever, chills, fatigue, weakness. Eyes: Negative for vision changes.  ENT: Negative for hoarseness, difficulty swallowing , nasal congestion. CV: Negative for chest pain, angina, palpitations, dyspnea on exertion, peripheral edema.  Respiratory: Negative for dyspnea at rest, dyspnea on exertion, cough, sputum, wheezing.  GI: See history of present illness. GU:  Negative for dysuria, hematuria, urinary incontinence, urinary frequency, nocturnal urination.  MS: Negative for joint pain, low back pain.  Derm: Negative for rash or itching.  Neuro: Negative for weakness, abnormal sensation, seizure, frequent headaches, memory loss, confusion.  Psych: Negative for anxiety, depression, suicidal ideation, hallucinations.  Endo: Negative for unusual weight change.  Heme: Negative for bruising or bleeding. Allergy: Negative for rash  or hives.    Physical Examination:  BP 129/61  Pulse 69  Temp 97.3 F (36.3 C) (Temporal)  Ht 5\' 5"  (1.651 m)  Wt 179 lb 12.8 oz (81.557 kg)  BMI 29.92 kg/m2   General: Well-nourished, well-developed in no acute distress.  Head: Normocephalic, atraumatic.   Eyes: Conjunctiva pink, no icterus. Mouth: Oropharyngeal mucosa moist and pink , no lesions erythema or exudate. Neck: Supple without thyromegaly, masses, or lymphadenopathy.  Lungs: Clear to auscultation bilaterally.  Heart: Regular rate and rhythm, no murmurs rubs or gallops.  Abdomen: Bowel sounds are normal, nontender, nondistended, no hepatosplenomegaly or masses, no abdominal bruits or    hernia , no rebound or guarding.   Rectal: defer Extremities: No lower extremity edema. No clubbing or deformities.  Neuro: Alert and oriented x 4 , grossly normal neurologically.  Skin: Warm and dry, no rash or jaundice.   Psych: Alert and cooperative, normal mood and affect.

## 2012-10-07 NOTE — Progress Notes (Signed)
At 0935, pt's BP 75/33, HR 43, Resp 16, O2 sat 90% on RA. Dr. Jena Gauss was notified. Orders given to give 1000cc bolus of 0.45% NS. Orders intitiated. Will continue to monitor.

## 2012-10-07 NOTE — Progress Notes (Signed)
Pt's 1/2 NS bolus was finished at 1115, pt's BP 101/49, HR 50, Resp 16. Pt asympotomatic. Pt's HR was 81 in preop, therefore Dr. Jena Gauss was notified of pt's continuous low HR throughout post-op, although pt is asymptomatic. Dr. Jena Gauss okay with VS and says she's fine to be discharged.

## 2012-10-07 NOTE — Interval H&P Note (Signed)
History and Physical Interval Note:  10/07/2012 8:26 AM  Tricia Jenkins  has presented today for surgery, with the diagnosis of Screening Colonoscopy  The various methods of treatment have been discussed with the patient and family. After consideration of risks, benefits and other options for treatment, the patient has consented to  Procedure(s) (LRB) with comments: COLONOSCOPY (N/A) - 8:30 as a surgical intervention .  The patient's history has been reviewed, patient examined, no change in status, stable for surgery.  I have reviewed the patient's chart and labs.  Questions were answered to the patient's satisfaction.     Eula Listen  Screening colonoscopy today per plan. Patient denies diarrhea or rectal bleeding.The risks, benefits, limitations, alternatives and imponderables have been reviewed with the patient. Questions have been answered. All parties are agreeable.

## 2012-10-07 NOTE — Op Note (Signed)
[facility] [address] [place], [address]   COLONOSCOPY PROCEDURE REPORT  PATIENT: Tricia Jenkins, Tricia Jenkins  MR#:         [id number] BIRTHDATE: August 05, 1941 , 71  yrs. old GENDER: Female ENDOSCOPIST: R.  Roetta Sessions, MD FACP FACG REFERRED BY:  Carylon Perches, M.D. PROCEDURE DATE:  10/07/2012 PROCEDURE:     Colonoscopy with biopsy, snare polypectomy and polyp ablation  INDICATIONS: Average risk colorectal cancer screening  INFORMED CONSENT:  The risks, benefits, alternatives and imponderables including but not limited to bleeding, perforation as well as the possibility of a missed lesion have been reviewed.  The potential for biopsy, lesion removal, etc. have also been discussed.  Questions have been answered.  All parties agreeable. Please see the history and physical in the medical record for more information.  MEDICATIONS: Versed 5 mg IV and Demerol 75 mg IV in divided doses. Cetacaine spray.  DESCRIPTION OF PROCEDURE:  After a digital rectal exam was performed, the EC-[id number] ([id number])  colonoscope was advanced from the anus through the rectum and colon to the area of the cecum, ileocecal valve and appendiceal orifice.  The cecum was deeply intubated.  These structures were well-seen and photographed for the record.  From the level of the cecum and ileocecal valve, the scope was slowly and cautiously withdrawn.  The mucosal surfaces were carefully surveyed utilizing scope tip deflection to facilitate fold flattening as needed.  The scope was pulled down into the rectum where a thorough examination including retroflexion was performed.    FINDINGS:  Adequate preparation. Normal-appearing rectum aside from a few diminutive polyps scattered over the recto-sigmoid junction. Left-sided diverticulosis. (1) diminutive polyp in the base the cecum. (1) serrated appearing polyp approximately 4 x 9 mm in dimensions on a fold in the mid ascending segment. There was (1) 5 x  6 mm sausage-shaped sessile polyp at the splenic flexure. Scattered left-sided diverticula. The remainder of the colonic mucosa appeared normal.  THERAPEUTIC / DIAGNOSTIC MANEUVERS PERFORMED:  The cecal polyp was cold biopsied/ removed. The ascending and splenic flexure polyps were  hot snare removed. The diminutive rectosigmoid polyps were ablated with the tip of the  hot snare loop.  COMPLICATIONS: None  CECAL WITHDRAWAL TIME:  14 minutes  IMPRESSION:  Colorectal polyps-treated and/or removed as described above. Colonic diverticulosis.  RECOMMENDATIONS: Followup on pathology. Further recommendations to follow.   _______________________________ eSigned:  R. Roetta Sessions, MD FACP [facility] 10/07/2012 9:12 AM   CC:

## 2012-10-11 ENCOUNTER — Encounter (HOSPITAL_COMMUNITY): Payer: Self-pay | Admitting: Internal Medicine

## 2012-10-16 ENCOUNTER — Encounter: Payer: Self-pay | Admitting: Internal Medicine

## 2012-10-18 ENCOUNTER — Other Ambulatory Visit: Payer: Self-pay

## 2012-10-18 ENCOUNTER — Encounter: Payer: Self-pay | Admitting: *Deleted

## 2012-10-18 MED ORDER — OMEPRAZOLE 20 MG PO CPDR: 20.0000 mg | DELAYED_RELEASE_CAPSULE | Freq: Every day | ORAL | Status: DC

## 2012-10-19 DIAGNOSIS — I10 Essential (primary) hypertension: Secondary | ICD-10-CM | POA: Diagnosis not present

## 2012-11-18 DIAGNOSIS — D235 Other benign neoplasm of skin of trunk: Secondary | ICD-10-CM | POA: Diagnosis not present

## 2012-11-18 DIAGNOSIS — I781 Nevus, non-neoplastic: Secondary | ICD-10-CM | POA: Diagnosis not present

## 2012-11-18 DIAGNOSIS — L570 Actinic keratosis: Secondary | ICD-10-CM | POA: Diagnosis not present

## 2013-02-28 DIAGNOSIS — F329 Major depressive disorder, single episode, unspecified: Secondary | ICD-10-CM | POA: Diagnosis not present

## 2013-03-23 DIAGNOSIS — F329 Major depressive disorder, single episode, unspecified: Secondary | ICD-10-CM | POA: Diagnosis not present

## 2013-03-24 DIAGNOSIS — D0470 Carcinoma in situ of skin of unspecified lower limb, including hip: Secondary | ICD-10-CM | POA: Diagnosis not present

## 2013-03-24 DIAGNOSIS — L259 Unspecified contact dermatitis, unspecified cause: Secondary | ICD-10-CM | POA: Diagnosis not present

## 2013-04-14 DIAGNOSIS — Z85828 Personal history of other malignant neoplasm of skin: Secondary | ICD-10-CM | POA: Diagnosis not present

## 2013-04-19 DIAGNOSIS — M1990 Unspecified osteoarthritis, unspecified site: Secondary | ICD-10-CM | POA: Diagnosis not present

## 2013-04-19 DIAGNOSIS — Z79899 Other long term (current) drug therapy: Secondary | ICD-10-CM | POA: Diagnosis not present

## 2013-04-19 DIAGNOSIS — I10 Essential (primary) hypertension: Secondary | ICD-10-CM | POA: Diagnosis not present

## 2013-04-26 DIAGNOSIS — F411 Generalized anxiety disorder: Secondary | ICD-10-CM | POA: Diagnosis not present

## 2013-04-26 DIAGNOSIS — K219 Gastro-esophageal reflux disease without esophagitis: Secondary | ICD-10-CM | POA: Diagnosis not present

## 2013-04-26 DIAGNOSIS — I10 Essential (primary) hypertension: Secondary | ICD-10-CM | POA: Diagnosis not present

## 2013-05-18 DIAGNOSIS — H40059 Ocular hypertension, unspecified eye: Secondary | ICD-10-CM | POA: Diagnosis not present

## 2013-06-13 DIAGNOSIS — L259 Unspecified contact dermatitis, unspecified cause: Secondary | ICD-10-CM | POA: Diagnosis not present

## 2013-06-20 DIAGNOSIS — L259 Unspecified contact dermatitis, unspecified cause: Secondary | ICD-10-CM | POA: Diagnosis not present

## 2013-07-05 ENCOUNTER — Other Ambulatory Visit (HOSPITAL_COMMUNITY): Payer: Self-pay | Admitting: Internal Medicine

## 2013-07-05 DIAGNOSIS — Z139 Encounter for screening, unspecified: Secondary | ICD-10-CM

## 2013-07-12 ENCOUNTER — Ambulatory Visit (HOSPITAL_COMMUNITY)
Admission: RE | Admit: 2013-07-12 | Discharge: 2013-07-12 | Disposition: A | Discharge status: Home / Self Care | Payer: Medicare Other | Source: Ambulatory Visit | Attending: Internal Medicine | Admitting: Internal Medicine

## 2013-07-12 DIAGNOSIS — Z139 Encounter for screening, unspecified: Secondary | ICD-10-CM

## 2013-07-12 DIAGNOSIS — Z1231 Encounter for screening mammogram for malignant neoplasm of breast: Secondary | ICD-10-CM | POA: Insufficient documentation

## 2013-07-18 DIAGNOSIS — L259 Unspecified contact dermatitis, unspecified cause: Secondary | ICD-10-CM | POA: Diagnosis not present

## 2013-07-20 DIAGNOSIS — Z23 Encounter for immunization: Secondary | ICD-10-CM | POA: Diagnosis not present

## 2013-07-27 DIAGNOSIS — I10 Essential (primary) hypertension: Secondary | ICD-10-CM | POA: Diagnosis not present

## 2013-07-27 DIAGNOSIS — F411 Generalized anxiety disorder: Secondary | ICD-10-CM | POA: Diagnosis not present

## 2013-10-11 DIAGNOSIS — Z85828 Personal history of other malignant neoplasm of skin: Secondary | ICD-10-CM | POA: Diagnosis not present

## 2013-10-11 DIAGNOSIS — L570 Actinic keratosis: Secondary | ICD-10-CM | POA: Diagnosis not present

## 2013-10-12 ENCOUNTER — Telehealth: Payer: Self-pay | Admitting: *Deleted

## 2013-10-12 MED ORDER — OMEPRAZOLE 20 MG PO CPDR: 20.0000 mg | DELAYED_RELEASE_CAPSULE | Freq: Every day | ORAL | Status: DC

## 2013-10-12 NOTE — Telephone Encounter (Signed)
Pt is taking prilosec. Routing to refill box

## 2013-10-12 NOTE — Telephone Encounter (Signed)
Completed.

## 2013-10-12 NOTE — Telephone Encounter (Signed)
Pt called wanting to get her RX refilled pt takes it is a purple pill, pt uses [facility] in [place] now pt states she quit using [facility] because they will not take her insurance. Please advise [phone]

## 2013-10-12 NOTE — Addendum Note (Signed)
Addended by: Orvil Feil on: 10/12/2013 12:10 PM   Modules accepted: Orders

## 2013-10-17 ENCOUNTER — Other Ambulatory Visit: Payer: Self-pay

## 2013-10-17 MED ORDER — OMEPRAZOLE 20 MG PO CPDR: 20.0000 mg | DELAYED_RELEASE_CAPSULE | Freq: Every day | ORAL | Status: DC

## 2013-10-17 NOTE — Addendum Note (Signed)
Addended by: Rosanne Sack on: 10/17/2013 01:53 PM   Modules accepted: Orders

## 2013-11-01 DIAGNOSIS — F411 Generalized anxiety disorder: Secondary | ICD-10-CM | POA: Diagnosis not present

## 2013-11-01 DIAGNOSIS — I10 Essential (primary) hypertension: Secondary | ICD-10-CM | POA: Diagnosis not present

## 2013-11-29 DIAGNOSIS — H2510 Age-related nuclear cataract, unspecified eye: Secondary | ICD-10-CM | POA: Diagnosis not present

## 2013-12-05 DIAGNOSIS — H2510 Age-related nuclear cataract, unspecified eye: Secondary | ICD-10-CM | POA: Diagnosis not present

## 2013-12-05 DIAGNOSIS — H25019 Cortical age-related cataract, unspecified eye: Secondary | ICD-10-CM | POA: Diagnosis not present

## 2013-12-05 DIAGNOSIS — H40019 Open angle with borderline findings, low risk, unspecified eye: Secondary | ICD-10-CM | POA: Diagnosis not present

## 2013-12-05 DIAGNOSIS — I7090 Unspecified atherosclerosis: Secondary | ICD-10-CM | POA: Diagnosis not present

## 2014-01-03 DIAGNOSIS — H269 Unspecified cataract: Secondary | ICD-10-CM | POA: Diagnosis not present

## 2014-01-03 DIAGNOSIS — H2510 Age-related nuclear cataract, unspecified eye: Secondary | ICD-10-CM | POA: Diagnosis not present

## 2014-02-03 DIAGNOSIS — H2510 Age-related nuclear cataract, unspecified eye: Secondary | ICD-10-CM | POA: Diagnosis not present

## 2014-02-03 DIAGNOSIS — H25019 Cortical age-related cataract, unspecified eye: Secondary | ICD-10-CM | POA: Diagnosis not present

## 2014-02-28 DIAGNOSIS — H269 Unspecified cataract: Secondary | ICD-10-CM | POA: Diagnosis not present

## 2014-02-28 DIAGNOSIS — H2510 Age-related nuclear cataract, unspecified eye: Secondary | ICD-10-CM | POA: Diagnosis not present

## 2014-04-11 DIAGNOSIS — L820 Inflamed seborrheic keratosis: Secondary | ICD-10-CM | POA: Diagnosis not present

## 2014-04-11 DIAGNOSIS — D2370 Other benign neoplasm of skin of unspecified lower limb, including hip: Secondary | ICD-10-CM | POA: Diagnosis not present

## 2014-04-11 DIAGNOSIS — L570 Actinic keratosis: Secondary | ICD-10-CM | POA: Diagnosis not present

## 2014-04-11 DIAGNOSIS — D485 Neoplasm of uncertain behavior of skin: Secondary | ICD-10-CM | POA: Diagnosis not present

## 2014-04-11 DIAGNOSIS — Z85828 Personal history of other malignant neoplasm of skin: Secondary | ICD-10-CM | POA: Diagnosis not present

## 2014-05-02 DIAGNOSIS — Z79899 Other long term (current) drug therapy: Secondary | ICD-10-CM | POA: Diagnosis not present

## 2014-05-02 DIAGNOSIS — I10 Essential (primary) hypertension: Secondary | ICD-10-CM | POA: Diagnosis not present

## 2014-05-02 DIAGNOSIS — M1990 Unspecified osteoarthritis, unspecified site: Secondary | ICD-10-CM | POA: Diagnosis not present

## 2014-05-09 DIAGNOSIS — Z0000 Encounter for general adult medical examination without abnormal findings: Secondary | ICD-10-CM | POA: Diagnosis not present

## 2014-05-09 DIAGNOSIS — Z23 Encounter for immunization: Secondary | ICD-10-CM | POA: Diagnosis not present

## 2014-06-21 DIAGNOSIS — Z23 Encounter for immunization: Secondary | ICD-10-CM | POA: Diagnosis not present

## 2014-07-10 ENCOUNTER — Other Ambulatory Visit (HOSPITAL_COMMUNITY): Payer: Self-pay | Admitting: Internal Medicine

## 2014-07-10 DIAGNOSIS — Z1231 Encounter for screening mammogram for malignant neoplasm of breast: Secondary | ICD-10-CM

## 2014-07-19 ENCOUNTER — Ambulatory Visit (HOSPITAL_COMMUNITY)
Admission: RE | Admit: 2014-07-19 | Discharge: 2014-07-19 | Disposition: A | Discharge status: Home / Self Care | Payer: Medicare Other | Source: Ambulatory Visit | Attending: Internal Medicine | Admitting: Internal Medicine

## 2014-07-19 DIAGNOSIS — Z1231 Encounter for screening mammogram for malignant neoplasm of breast: Secondary | ICD-10-CM | POA: Insufficient documentation

## 2014-08-09 DIAGNOSIS — M79671 Pain in right foot: Secondary | ICD-10-CM | POA: Diagnosis not present

## 2014-08-09 DIAGNOSIS — M79672 Pain in left foot: Secondary | ICD-10-CM | POA: Diagnosis not present

## 2014-08-09 DIAGNOSIS — G600 Hereditary motor and sensory neuropathy: Secondary | ICD-10-CM | POA: Diagnosis not present

## 2014-08-09 DIAGNOSIS — M722 Plantar fascial fibromatosis: Secondary | ICD-10-CM | POA: Diagnosis not present

## 2014-08-30 DIAGNOSIS — G600 Hereditary motor and sensory neuropathy: Secondary | ICD-10-CM | POA: Diagnosis not present

## 2014-08-30 DIAGNOSIS — M79671 Pain in right foot: Secondary | ICD-10-CM | POA: Diagnosis not present

## 2014-08-30 DIAGNOSIS — M722 Plantar fascial fibromatosis: Secondary | ICD-10-CM | POA: Diagnosis not present

## 2014-08-30 DIAGNOSIS — M79672 Pain in left foot: Secondary | ICD-10-CM | POA: Diagnosis not present

## 2014-09-18 DIAGNOSIS — Z961 Presence of intraocular lens: Secondary | ICD-10-CM | POA: Diagnosis not present

## 2014-09-20 DIAGNOSIS — M79671 Pain in right foot: Secondary | ICD-10-CM | POA: Diagnosis not present

## 2014-09-20 DIAGNOSIS — M79672 Pain in left foot: Secondary | ICD-10-CM | POA: Diagnosis not present

## 2014-09-20 DIAGNOSIS — G600 Hereditary motor and sensory neuropathy: Secondary | ICD-10-CM | POA: Diagnosis not present

## 2014-09-20 DIAGNOSIS — M722 Plantar fascial fibromatosis: Secondary | ICD-10-CM | POA: Diagnosis not present

## 2014-10-26 DIAGNOSIS — M79672 Pain in left foot: Secondary | ICD-10-CM | POA: Diagnosis not present

## 2014-10-26 DIAGNOSIS — M79671 Pain in right foot: Secondary | ICD-10-CM | POA: Diagnosis not present

## 2014-10-30 DIAGNOSIS — L570 Actinic keratosis: Secondary | ICD-10-CM | POA: Diagnosis not present

## 2014-10-30 DIAGNOSIS — Z85828 Personal history of other malignant neoplasm of skin: Secondary | ICD-10-CM | POA: Diagnosis not present

## 2014-11-02 DIAGNOSIS — E1140 Type 2 diabetes mellitus with diabetic neuropathy, unspecified: Secondary | ICD-10-CM | POA: Diagnosis not present

## 2014-11-02 DIAGNOSIS — G5790 Unspecified mononeuropathy of unspecified lower limb: Secondary | ICD-10-CM | POA: Diagnosis not present

## 2014-11-02 DIAGNOSIS — M79671 Pain in right foot: Secondary | ICD-10-CM | POA: Diagnosis not present

## 2014-11-02 DIAGNOSIS — M792 Neuralgia and neuritis, unspecified: Secondary | ICD-10-CM | POA: Diagnosis not present

## 2014-11-04 ENCOUNTER — Other Ambulatory Visit: Payer: Self-pay | Admitting: Gastroenterology

## 2014-11-04 DIAGNOSIS — K219 Gastro-esophageal reflux disease without esophagitis: Secondary | ICD-10-CM | POA: Diagnosis not present

## 2014-11-04 DIAGNOSIS — Z79899 Other long term (current) drug therapy: Secondary | ICD-10-CM | POA: Diagnosis not present

## 2014-11-04 DIAGNOSIS — M11262 Other chondrocalcinosis, left knee: Secondary | ICD-10-CM | POA: Diagnosis not present

## 2014-11-04 DIAGNOSIS — M25562 Pain in left knee: Secondary | ICD-10-CM | POA: Diagnosis not present

## 2014-11-04 DIAGNOSIS — M11862 Other specified crystal arthropathies, left knee: Secondary | ICD-10-CM | POA: Diagnosis not present

## 2014-11-04 DIAGNOSIS — Z87891 Personal history of nicotine dependence: Secondary | ICD-10-CM | POA: Diagnosis not present

## 2014-11-07 ENCOUNTER — Encounter: Payer: Self-pay | Admitting: Gastroenterology

## 2014-11-07 ENCOUNTER — Other Ambulatory Visit: Payer: Self-pay | Admitting: Gastroenterology

## 2014-11-07 NOTE — Telephone Encounter (Signed)
Patient will need another office visit (last one appears in 2013). Will call in 30 days worth to last until her follow-up

## 2014-11-07 NOTE — Telephone Encounter (Signed)
Refill requested, no OV since 2013. Will send in 30 days worth to last until an appointment can be scheduled.

## 2014-11-07 NOTE — Telephone Encounter (Signed)
MADE APPOINTMENT FOR PATIENT AND LETTER SENT

## 2014-11-07 NOTE — Telephone Encounter (Signed)
APPOINTMENT MADE AND LETTER SENT °

## 2014-11-09 DIAGNOSIS — M79671 Pain in right foot: Secondary | ICD-10-CM | POA: Diagnosis not present

## 2014-11-09 DIAGNOSIS — G600 Hereditary motor and sensory neuropathy: Secondary | ICD-10-CM | POA: Diagnosis not present

## 2014-11-09 DIAGNOSIS — M792 Neuralgia and neuritis, unspecified: Secondary | ICD-10-CM | POA: Diagnosis not present

## 2014-11-09 DIAGNOSIS — M79672 Pain in left foot: Secondary | ICD-10-CM | POA: Diagnosis not present

## 2014-11-10 DIAGNOSIS — M1000 Idiopathic gout, unspecified site: Secondary | ICD-10-CM | POA: Diagnosis not present

## 2014-11-10 DIAGNOSIS — I10 Essential (primary) hypertension: Secondary | ICD-10-CM | POA: Diagnosis not present

## 2014-11-14 DIAGNOSIS — M722 Plantar fascial fibromatosis: Secondary | ICD-10-CM | POA: Diagnosis not present

## 2014-11-14 DIAGNOSIS — G600 Hereditary motor and sensory neuropathy: Secondary | ICD-10-CM | POA: Diagnosis not present

## 2014-11-14 DIAGNOSIS — M79671 Pain in right foot: Secondary | ICD-10-CM | POA: Diagnosis not present

## 2014-11-14 DIAGNOSIS — E1140 Type 2 diabetes mellitus with diabetic neuropathy, unspecified: Secondary | ICD-10-CM | POA: Diagnosis not present

## 2014-11-16 DIAGNOSIS — M792 Neuralgia and neuritis, unspecified: Secondary | ICD-10-CM | POA: Diagnosis not present

## 2014-11-16 DIAGNOSIS — M79671 Pain in right foot: Secondary | ICD-10-CM | POA: Diagnosis not present

## 2014-11-16 DIAGNOSIS — G600 Hereditary motor and sensory neuropathy: Secondary | ICD-10-CM | POA: Diagnosis not present

## 2014-11-16 DIAGNOSIS — M79672 Pain in left foot: Secondary | ICD-10-CM | POA: Diagnosis not present

## 2014-11-21 DIAGNOSIS — M79671 Pain in right foot: Secondary | ICD-10-CM | POA: Diagnosis not present

## 2014-11-21 DIAGNOSIS — M792 Neuralgia and neuritis, unspecified: Secondary | ICD-10-CM | POA: Diagnosis not present

## 2014-11-21 DIAGNOSIS — M79672 Pain in left foot: Secondary | ICD-10-CM | POA: Diagnosis not present

## 2014-11-21 DIAGNOSIS — G600 Hereditary motor and sensory neuropathy: Secondary | ICD-10-CM | POA: Diagnosis not present

## 2014-11-23 DIAGNOSIS — M79672 Pain in left foot: Secondary | ICD-10-CM | POA: Diagnosis not present

## 2014-11-23 DIAGNOSIS — M792 Neuralgia and neuritis, unspecified: Secondary | ICD-10-CM | POA: Diagnosis not present

## 2014-11-23 DIAGNOSIS — M79671 Pain in right foot: Secondary | ICD-10-CM | POA: Diagnosis not present

## 2014-11-23 DIAGNOSIS — G600 Hereditary motor and sensory neuropathy: Secondary | ICD-10-CM | POA: Diagnosis not present

## 2014-11-27 ENCOUNTER — Telehealth: Payer: Self-pay | Admitting: Internal Medicine

## 2014-11-27 ENCOUNTER — Telehealth: Payer: Self-pay

## 2014-11-27 ENCOUNTER — Ambulatory Visit (INDEPENDENT_AMBULATORY_CARE_PROVIDER_SITE_OTHER): Payer: Medicare Other | Admitting: Gastroenterology

## 2014-11-27 ENCOUNTER — Encounter: Payer: Self-pay | Admitting: Gastroenterology

## 2014-11-27 VITALS — BP 119/59 | HR 81 | Temp 97.6°F | Ht 66.0 in | Wt 182.6 lb

## 2014-11-27 DIAGNOSIS — K219 Gastro-esophageal reflux disease without esophagitis: Secondary | ICD-10-CM | POA: Diagnosis not present

## 2014-11-27 MED ORDER — ESOMEPRAZOLE MAGNESIUM 40 MG PO CPDR: 40.0000 mg | DELAYED_RELEASE_CAPSULE | Freq: Every day | ORAL | Status: DC

## 2014-11-27 MED ORDER — DEXLANSOPRAZOLE 60 MG PO CPDR: 60.0000 mg | DELAYED_RELEASE_CAPSULE | Freq: Every day | ORAL | Status: DC

## 2014-11-27 NOTE — Progress Notes (Signed)
Primary Care Physician: Asencion Noble, MD  Primary Gastroenterologist:  Garfield Cornea, MD   Chief Complaint  Patient presents with  . Medication Refill    HPI: Tricia Jenkins is a 74 y.o. female here for follow-up of GERD. Last seen in 2014, for colonoscopy. See below for findings. She has historically been on omeprazole 20 mg daily. Over the past several weeks she's noted breakthrough symptoms especially after breakfast. Takes her omeprazole about 6 AM every morning. Denies dysphagia, vomiting, abdominal pain, constipation, diarrhea, melena, rectal bleeding. She notes her reflux tends to be related to certain foods, greasy/spicy. Discussed antireflux measures with her today.  Current Outpatient Prescriptions  Medication Sig Dispense Refill  . amLODipine (NORVASC) 5 MG tablet Take 5 mg by mouth daily.      Marland Kitchen gabapentin (NEURONTIN) 300 MG capsule Take 300 mg by mouth 3 (three) times daily.    Marland Kitchen ibuprofen (ADVIL,MOTRIN) 200 MG tablet Takes about 400mg  2 -3 times per week for arthritis.    Marland Kitchen losartan (COZAAR) 100 MG tablet Take 100 mg by mouth daily.    . magnesium oxide (MAG-OX) 400 MG tablet Take 400 mg by mouth daily.      Marland Kitchen omeprazole (PRILOSEC) 20 MG capsule TAKE ONE CAPSULE BY MOUTH DAILY 30 capsule 0   No current facility-administered medications for this visit.    Allergies as of 11/27/2014 - Review Complete 11/27/2014  Allergen Reaction Noted  . Naproxen    . Piroxicam     Past Medical History  Diagnosis Date  . HTN (hypertension)   . GERD (gastroesophageal reflux disease)   . Erosive esophagitis   . Internal hemorrhoids 12/02/2002    Past Surgical History  Procedure Laterality Date  . Appendectomy    . Kidney stone surgery    . Shoulder surgery      left  . Hemorrhoid surgery    . Bunionectomy      bilateral x5  . Colonoscopy w/ biopsies  12/08/2002    RMR: Internal hemorrhoids, otherwise normal rectum Polyp at endocecum cold biopsied/removed Abnormal  appearing ileocecal valve (of doubtful clinical significance)  biopsy showed chronic active colitis with significant lymphocytic infiltrate  . Esophagogastroduodenoscopy  04/28/2007    RMR: Distal esophageal erosions consistent with mild to moderate  reflux esophagitis, otherwise, normal esophagus, small hiatal hernia; otherwise, normal stomach, first and second duodenum  . Colonoscopy  10/07/2012    RMR: Colorectal polyps-treated and/or removed as described above. Colonic diverticulosis.(serrated adenomas) next TCS 5 years    ROS:  General: Negative for anorexia, weight loss, fever, chills, fatigue, weakness. ENT: Negative for hoarseness, difficulty swallowing , nasal congestion. CV: Negative for chest pain, angina, palpitations, dyspnea on exertion, peripheral edema.  Respiratory: Negative for dyspnea at rest, dyspnea on exertion, cough, sputum, wheezing.  GI: See history of present illness. GU:  Negative for dysuria, hematuria, urinary incontinence, urinary frequency, nocturnal urination.  Endo: Negative for unusual weight change.    Physical Examination:   BP 119/59 mmHg  Pulse 81  Temp(Src) 97.6 F (36.4 C) (Oral)  Ht 5\' 6"  (1.676 m)  Wt 182 lb 9.6 oz (82.827 kg)  BMI 29.49 kg/m2  General: Well-nourished, well-developed in no acute distress.  Eyes: No icterus. Mouth: Oropharyngeal mucosa moist and pink , no lesions erythema or exudate. Lungs: Clear to auscultation bilaterally.  Heart: Regular rate and rhythm, no murmurs rubs or gallops.  Abdomen: Bowel sounds are normal, nontender, nondistended, no hepatosplenomegaly or masses, no abdominal bruits  or hernia , no rebound or guarding.   Extremities: No lower extremity edema. No clubbing or deformities. Neuro: Alert and oriented x 4   Skin: Warm and dry, no jaundice.   Psych: Alert and cooperative, normal mood and affect.

## 2014-11-27 NOTE — Telephone Encounter (Signed)
Routing to [place] is going to cost too much.

## 2014-11-27 NOTE — Assessment & Plan Note (Signed)
Previously well controlled, breakthrough symptoms over the last several weeks. Trial of different PPI. Dexilant 60 mg daily before breakfast. She was advised to call us at the medication was much more than what she's in pain with omeprazole. Appears to be well covered on her plan. Nexium also appears to be well covered. Discussed antireflux measures. Return to the office in 2 years as long she is doing well. If she is not able to control her reflux symptoms with change in medication and antireflux measures, she is to let us know sooner.

## 2014-11-27 NOTE — Telephone Encounter (Signed)
Nexium sent in. Let's see how that one is. If still too much, then we will have to increase her omeprazole instead.

## 2014-11-27 NOTE — Telephone Encounter (Signed)
Pt is aware and will let us know if its too expensive.

## 2014-11-27 NOTE — Addendum Note (Signed)
Addended by: Mahala Menghini on: 11/27/2014 12:57 PM   Modules accepted: Orders

## 2014-11-27 NOTE — Telephone Encounter (Signed)
Pt called and stated that the Dexilant was going to cost to much. She would like to have something different called in please.

## 2014-11-27 NOTE — Telephone Encounter (Signed)
Pt was seen this morning by [facility] and was given a prescription (she doesn't remember name) and said that it's too expensive for her ($45) and could [facility] recommend something else. Please advise [phone]

## 2014-11-27 NOTE — Telephone Encounter (Signed)
Pt called and the Nexium is too expensive also.

## 2014-11-27 NOTE — Patient Instructions (Signed)
Stop omeprazole.  Start Dexilant 60mg  daily before breakfast. It appears that this should be well-covered on your plan but if not please let me know and we will send in another alternative.  Return to the office in 2 years.   Gastroesophageal Reflux Disease, Adult Gastroesophageal reflux disease (GERD) happens when acid from your stomach flows up into the esophagus. When acid comes in contact with the esophagus, the acid causes soreness (inflammation) in the esophagus. Over time, GERD may create small holes (ulcers) in the lining of the esophagus. CAUSES   Increased body weight. This puts pressure on the stomach, making acid rise from the stomach into the esophagus.  Smoking. This increases acid production in the stomach.  Drinking alcohol. This causes decreased pressure in the lower esophageal sphincter (valve or ring of muscle between the esophagus and stomach), allowing acid from the stomach into the esophagus.  Late evening meals and a full stomach. This increases pressure and acid production in the stomach.  A malformed lower esophageal sphincter. Sometimes, no cause is found. SYMPTOMS   Burning pain in the lower part of the mid-chest behind the breastbone and in the mid-stomach area. This may occur twice a week or more often.  Trouble swallowing.  Sore throat.  Dry cough.  Asthma-like symptoms including chest tightness, shortness of breath, or wheezing. DIAGNOSIS  Your caregiver may be able to diagnose GERD based on your symptoms. In some cases, X-rays and other tests may be done to check for complications or to check the condition of your stomach and esophagus. TREATMENT  Your caregiver may recommend over-the-counter or prescription medicines to help decrease acid production. Ask your caregiver before starting or adding any new medicines.  HOME CARE INSTRUCTIONS   Change the factors that you can control. Ask your caregiver for guidance concerning weight loss, quitting  smoking, and alcohol consumption.  Avoid foods and drinks that make your symptoms worse, such as:  Caffeine or alcoholic drinks.  Chocolate.  Peppermint or mint flavorings.  Garlic and onions.  Spicy foods.  Citrus fruits, such as oranges, lemons, or limes.  Tomato-based foods such as sauce, chili, salsa, and pizza.  Fried and fatty foods.  Avoid lying down for the 3 hours prior to your bedtime or prior to taking a nap.  Eat small, frequent meals instead of large meals.  Wear loose-fitting clothing. Do not wear anything tight around your waist that causes pressure on your stomach.  Raise the head of your bed 6 to 8 inches with wood blocks to help you sleep. Extra pillows will not help.  Only take over-the-counter or prescription medicines for pain, discomfort, or fever as directed by your caregiver.  Do not take aspirin, ibuprofen, or other nonsteroidal anti-inflammatory drugs (NSAIDs). SEEK IMMEDIATE MEDICAL CARE IF:   You have pain in your arms, neck, jaw, teeth, or back.  Your pain increases or changes in intensity or duration.  You develop nausea, vomiting, or sweating (diaphoresis).  You develop shortness of breath, or you faint.  Your vomit is green, yellow, black, or looks like coffee grounds or blood.  Your stool is red, bloody, or black. These symptoms could be signs of other problems, such as heart disease, gastric bleeding, or esophageal bleeding. MAKE SURE YOU:   Understand these instructions.  Will watch your condition.  Will get help right away if you are not doing well or get worse. Document Released: 06/18/2005 Document Revised: 12/01/2011 Document Reviewed: 03/28/2011 [facility] Patient Information 2015 [place], [place]. This  information is not intended to replace advice given to you by your health care provider. Make sure you discuss any questions you have with your health care provider.  

## 2014-11-28 MED ORDER — OMEPRAZOLE 20 MG PO CPDR: 20.0000 mg | DELAYED_RELEASE_CAPSULE | Freq: Two times a day (BID) | ORAL | Status: DC

## 2014-11-28 NOTE — Addendum Note (Signed)
Addended by: Mahala Menghini on: 11/28/2014 10:38 AM   Modules accepted: Orders, Medications

## 2014-11-28 NOTE — Telephone Encounter (Signed)
OK. Well unfortunately that leaves [place] with increasing her omeprazole to BID. I have sent in that RX now.

## 2014-11-28 NOTE — Progress Notes (Signed)
cc'ed to pcp °

## 2014-11-28 NOTE — Telephone Encounter (Signed)
Called and pt aware of new rx.

## 2014-12-21 DIAGNOSIS — M79672 Pain in left foot: Secondary | ICD-10-CM | POA: Diagnosis not present

## 2014-12-21 DIAGNOSIS — G600 Hereditary motor and sensory neuropathy: Secondary | ICD-10-CM | POA: Diagnosis not present

## 2014-12-21 DIAGNOSIS — M79671 Pain in right foot: Secondary | ICD-10-CM | POA: Diagnosis not present

## 2014-12-21 DIAGNOSIS — M792 Neuralgia and neuritis, unspecified: Secondary | ICD-10-CM | POA: Diagnosis not present

## 2014-12-25 DIAGNOSIS — M792 Neuralgia and neuritis, unspecified: Secondary | ICD-10-CM | POA: Diagnosis not present

## 2014-12-25 DIAGNOSIS — G600 Hereditary motor and sensory neuropathy: Secondary | ICD-10-CM | POA: Diagnosis not present

## 2014-12-25 DIAGNOSIS — M79671 Pain in right foot: Secondary | ICD-10-CM | POA: Diagnosis not present

## 2014-12-25 DIAGNOSIS — M79672 Pain in left foot: Secondary | ICD-10-CM | POA: Diagnosis not present

## 2015-02-23 DIAGNOSIS — M722 Plantar fascial fibromatosis: Secondary | ICD-10-CM | POA: Diagnosis not present

## 2015-02-23 DIAGNOSIS — M79671 Pain in right foot: Secondary | ICD-10-CM | POA: Diagnosis not present

## 2015-03-15 DIAGNOSIS — M7661 Achilles tendinitis, right leg: Secondary | ICD-10-CM | POA: Diagnosis not present

## 2015-03-15 DIAGNOSIS — M79671 Pain in right foot: Secondary | ICD-10-CM | POA: Diagnosis not present

## 2015-04-08 ENCOUNTER — Other Ambulatory Visit: Payer: Self-pay | Admitting: Gastroenterology

## 2015-05-03 DIAGNOSIS — L570 Actinic keratosis: Secondary | ICD-10-CM | POA: Diagnosis not present

## 2015-05-03 DIAGNOSIS — Z85828 Personal history of other malignant neoplasm of skin: Secondary | ICD-10-CM | POA: Diagnosis not present

## 2015-05-07 DIAGNOSIS — Z79899 Other long term (current) drug therapy: Secondary | ICD-10-CM | POA: Diagnosis not present

## 2015-05-07 DIAGNOSIS — K219 Gastro-esophageal reflux disease without esophagitis: Secondary | ICD-10-CM | POA: Diagnosis not present

## 2015-05-07 DIAGNOSIS — M109 Gout, unspecified: Secondary | ICD-10-CM | POA: Diagnosis not present

## 2015-05-07 DIAGNOSIS — I10 Essential (primary) hypertension: Secondary | ICD-10-CM | POA: Diagnosis not present

## 2015-05-15 DIAGNOSIS — Z6831 Body mass index (BMI) 31.0-31.9, adult: Secondary | ICD-10-CM | POA: Diagnosis not present

## 2015-05-15 DIAGNOSIS — I10 Essential (primary) hypertension: Secondary | ICD-10-CM | POA: Diagnosis not present

## 2015-05-15 DIAGNOSIS — N183 Chronic kidney disease, stage 3 (moderate): Secondary | ICD-10-CM | POA: Diagnosis not present

## 2015-05-15 DIAGNOSIS — E785 Hyperlipidemia, unspecified: Secondary | ICD-10-CM | POA: Diagnosis not present

## 2015-06-07 DIAGNOSIS — M79672 Pain in left foot: Secondary | ICD-10-CM | POA: Diagnosis not present

## 2015-06-07 DIAGNOSIS — M722 Plantar fascial fibromatosis: Secondary | ICD-10-CM | POA: Diagnosis not present

## 2015-06-07 DIAGNOSIS — M79671 Pain in right foot: Secondary | ICD-10-CM | POA: Diagnosis not present

## 2015-06-12 DIAGNOSIS — Z23 Encounter for immunization: Secondary | ICD-10-CM | POA: Diagnosis not present

## 2015-07-04 ENCOUNTER — Other Ambulatory Visit (HOSPITAL_COMMUNITY): Payer: Self-pay | Admitting: Internal Medicine

## 2015-07-04 DIAGNOSIS — Z1231 Encounter for screening mammogram for malignant neoplasm of breast: Secondary | ICD-10-CM

## 2015-07-09 ENCOUNTER — Other Ambulatory Visit (HOSPITAL_COMMUNITY): Payer: Self-pay | Admitting: Internal Medicine

## 2015-07-09 ENCOUNTER — Ambulatory Visit (HOSPITAL_COMMUNITY)
Admission: RE | Admit: 2015-07-09 | Discharge: 2015-07-09 | Disposition: A | Discharge status: Home / Self Care | Payer: Medicare Other | Source: Ambulatory Visit | Attending: Internal Medicine | Admitting: Internal Medicine

## 2015-07-09 DIAGNOSIS — M25511 Pain in right shoulder: Secondary | ICD-10-CM | POA: Insufficient documentation

## 2015-07-09 DIAGNOSIS — M25551 Pain in right hip: Secondary | ICD-10-CM | POA: Diagnosis not present

## 2015-07-12 DIAGNOSIS — Z6831 Body mass index (BMI) 31.0-31.9, adult: Secondary | ICD-10-CM | POA: Diagnosis not present

## 2015-07-12 DIAGNOSIS — M25511 Pain in right shoulder: Secondary | ICD-10-CM | POA: Diagnosis not present

## 2015-07-12 DIAGNOSIS — M25551 Pain in right hip: Secondary | ICD-10-CM | POA: Diagnosis not present

## 2015-07-23 ENCOUNTER — Ambulatory Visit (HOSPITAL_COMMUNITY)
Admission: RE | Admit: 2015-07-23 | Discharge: 2015-07-23 | Disposition: A | Discharge status: Home / Self Care | Payer: Medicare Other | Source: Ambulatory Visit | Attending: Internal Medicine | Admitting: Internal Medicine

## 2015-07-23 DIAGNOSIS — Z1231 Encounter for screening mammogram for malignant neoplasm of breast: Secondary | ICD-10-CM | POA: Insufficient documentation

## 2015-09-07 ENCOUNTER — Other Ambulatory Visit (HOSPITAL_COMMUNITY): Payer: Self-pay | Admitting: Internal Medicine

## 2015-09-07 DIAGNOSIS — R1011 Right upper quadrant pain: Secondary | ICD-10-CM | POA: Diagnosis not present

## 2015-09-07 DIAGNOSIS — Z6830 Body mass index (BMI) 30.0-30.9, adult: Secondary | ICD-10-CM | POA: Diagnosis not present

## 2015-09-10 ENCOUNTER — Ambulatory Visit (HOSPITAL_COMMUNITY)
Admission: RE | Admit: 2015-09-10 | Discharge: 2015-09-10 | Disposition: A | Discharge status: Home / Self Care | Payer: Medicare Other | Source: Ambulatory Visit | Attending: Internal Medicine | Admitting: Internal Medicine

## 2015-09-10 DIAGNOSIS — R109 Unspecified abdominal pain: Secondary | ICD-10-CM | POA: Diagnosis not present

## 2015-09-10 DIAGNOSIS — K7689 Other specified diseases of liver: Secondary | ICD-10-CM | POA: Diagnosis not present

## 2015-09-10 DIAGNOSIS — R1011 Right upper quadrant pain: Secondary | ICD-10-CM | POA: Insufficient documentation

## 2015-10-03 ENCOUNTER — Encounter: Payer: Self-pay | Admitting: *Deleted

## 2015-10-22 DIAGNOSIS — M5416 Radiculopathy, lumbar region: Secondary | ICD-10-CM | POA: Diagnosis not present

## 2015-10-22 DIAGNOSIS — Z6830 Body mass index (BMI) 30.0-30.9, adult: Secondary | ICD-10-CM | POA: Diagnosis not present

## 2015-11-01 ENCOUNTER — Other Ambulatory Visit (HOSPITAL_COMMUNITY): Payer: Self-pay | Admitting: Internal Medicine

## 2015-11-01 ENCOUNTER — Ambulatory Visit (HOSPITAL_COMMUNITY)
Admission: RE | Admit: 2015-11-01 | Discharge: 2015-11-01 | Disposition: A | Discharge status: Home / Self Care | Payer: Medicare Other | Source: Ambulatory Visit | Attending: Internal Medicine | Admitting: Internal Medicine

## 2015-11-01 DIAGNOSIS — M5136 Other intervertebral disc degeneration, lumbar region: Secondary | ICD-10-CM | POA: Diagnosis not present

## 2015-11-01 DIAGNOSIS — M4690 Unspecified inflammatory spondylopathy, site unspecified: Secondary | ICD-10-CM | POA: Diagnosis not present

## 2015-11-01 DIAGNOSIS — M25552 Pain in left hip: Secondary | ICD-10-CM | POA: Insufficient documentation

## 2015-11-01 DIAGNOSIS — M79605 Pain in left leg: Secondary | ICD-10-CM | POA: Insufficient documentation

## 2015-11-01 DIAGNOSIS — R52 Pain, unspecified: Secondary | ICD-10-CM

## 2015-11-12 ENCOUNTER — Ambulatory Visit: Payer: Medicare Other | Admitting: Orthopedic Surgery

## 2015-11-12 DIAGNOSIS — L570 Actinic keratosis: Secondary | ICD-10-CM | POA: Diagnosis not present

## 2015-11-12 DIAGNOSIS — L280 Lichen simplex chronicus: Secondary | ICD-10-CM | POA: Diagnosis not present

## 2015-11-13 ENCOUNTER — Ambulatory Visit: Payer: Medicare Other | Admitting: Orthopedic Surgery

## 2015-11-29 DIAGNOSIS — M5136 Other intervertebral disc degeneration, lumbar region: Secondary | ICD-10-CM | POA: Diagnosis not present

## 2015-11-29 DIAGNOSIS — M545 Low back pain: Secondary | ICD-10-CM | POA: Diagnosis not present

## 2015-11-29 DIAGNOSIS — M25562 Pain in left knee: Secondary | ICD-10-CM | POA: Diagnosis not present

## 2015-11-29 DIAGNOSIS — M47817 Spondylosis without myelopathy or radiculopathy, lumbosacral region: Secondary | ICD-10-CM | POA: Diagnosis not present

## 2015-11-30 DIAGNOSIS — S0501XA Injury of conjunctiva and corneal abrasion without foreign body, right eye, initial encounter: Secondary | ICD-10-CM | POA: Diagnosis not present

## 2015-11-30 DIAGNOSIS — Z87891 Personal history of nicotine dependence: Secondary | ICD-10-CM | POA: Diagnosis not present

## 2015-11-30 DIAGNOSIS — Z79899 Other long term (current) drug therapy: Secondary | ICD-10-CM | POA: Diagnosis not present

## 2015-11-30 DIAGNOSIS — X58XXXA Exposure to other specified factors, initial encounter: Secondary | ICD-10-CM | POA: Diagnosis not present

## 2015-11-30 DIAGNOSIS — K219 Gastro-esophageal reflux disease without esophagitis: Secondary | ICD-10-CM | POA: Diagnosis not present

## 2015-12-03 DIAGNOSIS — H16141 Punctate keratitis, right eye: Secondary | ICD-10-CM | POA: Diagnosis not present

## 2015-12-04 DIAGNOSIS — M5442 Lumbago with sciatica, left side: Secondary | ICD-10-CM | POA: Diagnosis not present

## 2015-12-11 DIAGNOSIS — M5442 Lumbago with sciatica, left side: Secondary | ICD-10-CM | POA: Diagnosis not present

## 2015-12-13 DIAGNOSIS — M5442 Lumbago with sciatica, left side: Secondary | ICD-10-CM | POA: Diagnosis not present

## 2015-12-17 DIAGNOSIS — M25561 Pain in right knee: Secondary | ICD-10-CM | POA: Diagnosis not present

## 2015-12-17 DIAGNOSIS — I10 Essential (primary) hypertension: Secondary | ICD-10-CM | POA: Diagnosis not present

## 2015-12-17 DIAGNOSIS — M47816 Spondylosis without myelopathy or radiculopathy, lumbar region: Secondary | ICD-10-CM | POA: Diagnosis not present

## 2015-12-17 DIAGNOSIS — M5136 Other intervertebral disc degeneration, lumbar region: Secondary | ICD-10-CM | POA: Diagnosis not present

## 2015-12-17 DIAGNOSIS — M47817 Spondylosis without myelopathy or radiculopathy, lumbosacral region: Secondary | ICD-10-CM | POA: Diagnosis not present

## 2015-12-17 DIAGNOSIS — M545 Low back pain: Secondary | ICD-10-CM | POA: Diagnosis not present

## 2015-12-17 DIAGNOSIS — Z79899 Other long term (current) drug therapy: Secondary | ICD-10-CM | POA: Diagnosis not present

## 2015-12-17 DIAGNOSIS — Z87891 Personal history of nicotine dependence: Secondary | ICD-10-CM | POA: Diagnosis not present

## 2015-12-17 DIAGNOSIS — Z87442 Personal history of urinary calculi: Secondary | ICD-10-CM | POA: Diagnosis not present

## 2015-12-17 DIAGNOSIS — Z888 Allergy status to other drugs, medicaments and biological substances status: Secondary | ICD-10-CM | POA: Diagnosis not present

## 2015-12-17 DIAGNOSIS — R32 Unspecified urinary incontinence: Secondary | ICD-10-CM | POA: Diagnosis not present

## 2015-12-17 DIAGNOSIS — M25562 Pain in left knee: Secondary | ICD-10-CM | POA: Diagnosis not present

## 2015-12-18 DIAGNOSIS — M5442 Lumbago with sciatica, left side: Secondary | ICD-10-CM | POA: Diagnosis not present

## 2015-12-20 DIAGNOSIS — M5442 Lumbago with sciatica, left side: Secondary | ICD-10-CM | POA: Diagnosis not present

## 2015-12-26 DIAGNOSIS — M5442 Lumbago with sciatica, left side: Secondary | ICD-10-CM | POA: Diagnosis not present

## 2015-12-26 DIAGNOSIS — M4716 Other spondylosis with myelopathy, lumbar region: Secondary | ICD-10-CM | POA: Diagnosis not present

## 2015-12-27 DIAGNOSIS — M5442 Lumbago with sciatica, left side: Secondary | ICD-10-CM | POA: Diagnosis not present

## 2015-12-27 DIAGNOSIS — M4716 Other spondylosis with myelopathy, lumbar region: Secondary | ICD-10-CM | POA: Diagnosis not present

## 2016-01-01 DIAGNOSIS — M4716 Other spondylosis with myelopathy, lumbar region: Secondary | ICD-10-CM | POA: Diagnosis not present

## 2016-01-01 DIAGNOSIS — M5442 Lumbago with sciatica, left side: Secondary | ICD-10-CM | POA: Diagnosis not present

## 2016-01-07 DIAGNOSIS — M5442 Lumbago with sciatica, left side: Secondary | ICD-10-CM | POA: Diagnosis not present

## 2016-01-07 DIAGNOSIS — M4716 Other spondylosis with myelopathy, lumbar region: Secondary | ICD-10-CM | POA: Diagnosis not present

## 2016-01-09 DIAGNOSIS — M5442 Lumbago with sciatica, left side: Secondary | ICD-10-CM | POA: Diagnosis not present

## 2016-01-09 DIAGNOSIS — M4716 Other spondylosis with myelopathy, lumbar region: Secondary | ICD-10-CM | POA: Diagnosis not present

## 2016-01-23 DIAGNOSIS — M79671 Pain in right foot: Secondary | ICD-10-CM | POA: Diagnosis not present

## 2016-01-23 DIAGNOSIS — G600 Hereditary motor and sensory neuropathy: Secondary | ICD-10-CM | POA: Diagnosis not present

## 2016-01-23 DIAGNOSIS — M9261 Juvenile osteochondrosis of tarsus, right ankle: Secondary | ICD-10-CM | POA: Diagnosis not present

## 2016-02-14 DIAGNOSIS — M79671 Pain in right foot: Secondary | ICD-10-CM | POA: Diagnosis not present

## 2016-02-14 DIAGNOSIS — M9261 Juvenile osteochondrosis of tarsus, right ankle: Secondary | ICD-10-CM | POA: Diagnosis not present

## 2016-02-14 DIAGNOSIS — G600 Hereditary motor and sensory neuropathy: Secondary | ICD-10-CM | POA: Diagnosis not present

## 2016-02-28 DIAGNOSIS — G600 Hereditary motor and sensory neuropathy: Secondary | ICD-10-CM | POA: Diagnosis not present

## 2016-02-28 DIAGNOSIS — M9261 Juvenile osteochondrosis of tarsus, right ankle: Secondary | ICD-10-CM | POA: Diagnosis not present

## 2016-02-28 DIAGNOSIS — M79671 Pain in right foot: Secondary | ICD-10-CM | POA: Diagnosis not present

## 2016-03-19 ENCOUNTER — Ambulatory Visit (HOSPITAL_COMMUNITY)
Admission: RE | Admit: 2016-03-19 | Discharge: 2016-03-19 | Disposition: A | Discharge status: Home / Self Care | Payer: Medicare Other | Source: Ambulatory Visit | Attending: Internal Medicine | Admitting: Internal Medicine

## 2016-03-19 ENCOUNTER — Other Ambulatory Visit (HOSPITAL_COMMUNITY): Payer: Self-pay | Admitting: Internal Medicine

## 2016-03-19 DIAGNOSIS — M25561 Pain in right knee: Secondary | ICD-10-CM | POA: Insufficient documentation

## 2016-03-19 DIAGNOSIS — I739 Peripheral vascular disease, unspecified: Secondary | ICD-10-CM | POA: Insufficient documentation

## 2016-03-19 DIAGNOSIS — M25861 Other specified joint disorders, right knee: Secondary | ICD-10-CM | POA: Diagnosis not present

## 2016-03-19 DIAGNOSIS — R52 Pain, unspecified: Secondary | ICD-10-CM

## 2016-03-19 DIAGNOSIS — M179 Osteoarthritis of knee, unspecified: Secondary | ICD-10-CM | POA: Diagnosis not present

## 2016-03-19 DIAGNOSIS — M11261 Other chondrocalcinosis, right knee: Secondary | ICD-10-CM | POA: Insufficient documentation

## 2016-03-21 ENCOUNTER — Other Ambulatory Visit (HOSPITAL_COMMUNITY): Payer: Self-pay | Admitting: Internal Medicine

## 2016-03-21 DIAGNOSIS — M25561 Pain in right knee: Secondary | ICD-10-CM

## 2016-04-03 ENCOUNTER — Ambulatory Visit (HOSPITAL_COMMUNITY)
Admission: RE | Admit: 2016-04-03 | Discharge: 2016-04-03 | Disposition: A | Discharge status: Home / Self Care | Payer: Medicare Other | Source: Ambulatory Visit | Attending: Internal Medicine | Admitting: Internal Medicine

## 2016-04-03 DIAGNOSIS — S83281A Other tear of lateral meniscus, current injury, right knee, initial encounter: Secondary | ICD-10-CM | POA: Insufficient documentation

## 2016-04-03 DIAGNOSIS — X58XXXA Exposure to other specified factors, initial encounter: Secondary | ICD-10-CM | POA: Insufficient documentation

## 2016-04-03 DIAGNOSIS — M25861 Other specified joint disorders, right knee: Secondary | ICD-10-CM | POA: Diagnosis not present

## 2016-04-03 DIAGNOSIS — M7121 Synovial cyst of popliteal space [Baker], right knee: Secondary | ICD-10-CM | POA: Diagnosis not present

## 2016-04-03 DIAGNOSIS — M25561 Pain in right knee: Secondary | ICD-10-CM | POA: Insufficient documentation

## 2016-04-03 DIAGNOSIS — S83241A Other tear of medial meniscus, current injury, right knee, initial encounter: Secondary | ICD-10-CM | POA: Diagnosis not present

## 2016-04-08 DIAGNOSIS — S83281A Other tear of lateral meniscus, current injury, right knee, initial encounter: Secondary | ICD-10-CM | POA: Diagnosis not present

## 2016-04-15 DIAGNOSIS — H40053 Ocular hypertension, bilateral: Secondary | ICD-10-CM | POA: Diagnosis not present

## 2016-05-13 DIAGNOSIS — S83281D Other tear of lateral meniscus, current injury, right knee, subsequent encounter: Secondary | ICD-10-CM | POA: Diagnosis not present

## 2016-07-10 DIAGNOSIS — Z23 Encounter for immunization: Secondary | ICD-10-CM | POA: Diagnosis not present

## 2016-07-22 DIAGNOSIS — S83281D Other tear of lateral meniscus, current injury, right knee, subsequent encounter: Secondary | ICD-10-CM | POA: Diagnosis not present

## 2016-07-29 ENCOUNTER — Other Ambulatory Visit (HOSPITAL_COMMUNITY): Payer: Self-pay | Admitting: Internal Medicine

## 2016-07-29 DIAGNOSIS — Z1231 Encounter for screening mammogram for malignant neoplasm of breast: Secondary | ICD-10-CM

## 2016-08-11 ENCOUNTER — Ambulatory Visit (HOSPITAL_COMMUNITY)
Admission: RE | Admit: 2016-08-11 | Discharge: 2016-08-11 | Disposition: A | Discharge status: Home / Self Care | Payer: Medicare Other | Source: Ambulatory Visit | Attending: Internal Medicine | Admitting: Internal Medicine

## 2016-08-11 DIAGNOSIS — Z1231 Encounter for screening mammogram for malignant neoplasm of breast: Secondary | ICD-10-CM

## 2016-09-24 ENCOUNTER — Encounter: Payer: Self-pay | Admitting: Internal Medicine

## 2016-10-20 DIAGNOSIS — M542 Cervicalgia: Secondary | ICD-10-CM | POA: Diagnosis not present

## 2016-11-05 ENCOUNTER — Ambulatory Visit (INDEPENDENT_AMBULATORY_CARE_PROVIDER_SITE_OTHER): Payer: Medicare Other | Admitting: Gastroenterology

## 2016-11-05 ENCOUNTER — Encounter: Payer: Self-pay | Admitting: Gastroenterology

## 2016-11-05 VITALS — BP 121/66 | HR 78 | Temp 97.9°F | Ht 65.0 in | Wt 179.0 lb

## 2016-11-05 DIAGNOSIS — K219 Gastro-esophageal reflux disease without esophagitis: Secondary | ICD-10-CM | POA: Diagnosis not present

## 2016-11-05 NOTE — Progress Notes (Signed)
cc'ed to pcp °

## 2016-11-05 NOTE — Patient Instructions (Signed)
1. Return to the office in one year. You will be due for a colonoscopy at that time.  2. Continue omeprazole 20mg  once to twice per day for acid reflux.

## 2016-11-05 NOTE — Assessment & Plan Note (Signed)
Doing well on omeprazole 20 mg daily to twice daily. Anti-reflex measures reinforced. She is due for surveillance colonoscopy January 2019, will have her come back for follow-up office visit around that time. Call with any questions or concerns.

## 2016-11-05 NOTE — Progress Notes (Signed)
      Primary Care Physician: Asencion Noble, MD  Primary Gastroenterologist:  Garfield Cornea, MD   Chief Complaint  Patient presents with  . Gastroesophageal Reflux    f/u, doing ok    HPI: Tricia Jenkins is a 76 y.o. female here for follow-up of GERD. Last seen March 2016.Doing very well. Heartburn well-controlled for the most part on omeprazole 20 mg daily but she takes an additional dose with breakthrough symptoms. Denies dysphagia, abdominal pain, melena, rectal bleeding, constipation, diarrhea. She will would do for her surveillance colonoscopy for history of polyps in January 2019.  Current Outpatient Prescriptions  Medication Sig Dispense Refill  . amLODipine (NORVASC) 5 MG tablet Take 5 mg by mouth daily.      Marland Kitchen ibuprofen (ADVIL,MOTRIN) 200 MG tablet Take 400 mg by mouth as needed.     Marland Kitchen losartan (COZAAR) 100 MG tablet Take 100 mg by mouth daily.    . magnesium oxide (MAG-OX) 400 MG tablet Take 400 mg by mouth daily.      Marland Kitchen omeprazole (PRILOSEC) 20 MG capsule TAKE ONE CAPSULE BY MOUTH TWICE A DAY BEFORE A MEAL 60 capsule 5   No current facility-administered medications for this visit.     Allergies as of 11/05/2016 - Review Complete 11/05/2016  Allergen Reaction Noted  . Naproxen    . Piroxicam      ROS:  General: Negative for anorexia, weight loss, fever, chills, fatigue, weakness. ENT: Negative for hoarseness, difficulty swallowing , nasal congestion. CV: Negative for chest pain, angina, palpitations, dyspnea on exertion, peripheral edema.  Respiratory: Negative for dyspnea at rest, dyspnea on exertion, cough, sputum, wheezing.  GI: See history of present illness. GU:  Negative for dysuria, hematuria, urinary incontinence, urinary frequency, nocturnal urination.  Endo: Negative for unusual weight change.    Physical Examination:   BP 121/66   Pulse 78   Temp 97.9 F (36.6 C) (Oral)   Ht 5\' 5"  (1.651 m)   Wt 179 lb (81.2 kg)   BMI 29.79 kg/m   General:  Well-nourished, well-developed in no acute distress.  Eyes: No icterus. Mouth: Oropharyngeal mucosa moist and pink , no lesions erythema or exudate. Lungs: Clear to auscultation bilaterally.  Heart: Regular rate and rhythm, no murmurs rubs or gallops.  Abdomen: Bowel sounds are normal, nontender, nondistended, no hepatosplenomegaly or masses, no abdominal bruits or hernia , no rebound or guarding.   Extremities: No lower extremity edema. No clubbing or deformities. Neuro: Alert and oriented x 4   Skin: Warm and dry, no jaundice.   Psych: Alert and cooperative, normal mood and affect.

## 2016-11-11 DIAGNOSIS — S8001XA Contusion of right knee, initial encounter: Secondary | ICD-10-CM | POA: Diagnosis not present

## 2016-11-15 DIAGNOSIS — I10 Essential (primary) hypertension: Secondary | ICD-10-CM | POA: Diagnosis not present

## 2016-11-15 DIAGNOSIS — M542 Cervicalgia: Secondary | ICD-10-CM | POA: Diagnosis not present

## 2016-11-15 DIAGNOSIS — S139XXA Sprain of joints and ligaments of unspecified parts of neck, initial encounter: Secondary | ICD-10-CM | POA: Diagnosis not present

## 2016-11-15 DIAGNOSIS — M79601 Pain in right arm: Secondary | ICD-10-CM | POA: Diagnosis not present

## 2016-11-15 DIAGNOSIS — M1990 Unspecified osteoarthritis, unspecified site: Secondary | ICD-10-CM | POA: Diagnosis not present

## 2016-11-15 DIAGNOSIS — M549 Dorsalgia, unspecified: Secondary | ICD-10-CM | POA: Diagnosis not present

## 2016-11-15 DIAGNOSIS — Z79899 Other long term (current) drug therapy: Secondary | ICD-10-CM | POA: Diagnosis not present

## 2016-11-15 DIAGNOSIS — M5030 Other cervical disc degeneration, unspecified cervical region: Secondary | ICD-10-CM | POA: Diagnosis not present

## 2016-11-15 DIAGNOSIS — X58XXXA Exposure to other specified factors, initial encounter: Secondary | ICD-10-CM | POA: Diagnosis not present

## 2016-11-20 ENCOUNTER — Ambulatory Visit (HOSPITAL_COMMUNITY): Payer: Medicare Other | Attending: Internal Medicine | Admitting: Physical Therapy

## 2016-11-20 DIAGNOSIS — R29898 Other symptoms and signs involving the musculoskeletal system: Secondary | ICD-10-CM | POA: Insufficient documentation

## 2016-11-20 DIAGNOSIS — M542 Cervicalgia: Secondary | ICD-10-CM | POA: Diagnosis not present

## 2016-11-20 DIAGNOSIS — R293 Abnormal posture: Secondary | ICD-10-CM | POA: Insufficient documentation

## 2016-11-20 NOTE — Therapy (Signed)
[place], [place], [address] Phone: [phone]   Fax:  [phone]  Physical Therapy Evaluation  Patient Details  Name: Tricia Jenkins MRN: [id number] Date of Birth: Feb 17, 1941 Referring Provider: Asencion Noble   Encounter Date: 11/20/2016      PT End of Session - 11/20/16 1623    Visit Number 1   Number of Visits 13   Date for PT Re-Evaluation 12/11/16   Authorization Type Medicare/Generic [employer]    Authorization Time Period 11/20/16 to 01/01/17   Authorization - Visit Number 1   Authorization - Number of Visits 10   PT Start Time 1033   PT Stop Time 1111   PT Time Calculation (min) 38 min   Activity Tolerance Patient tolerated treatment well   Behavior During Therapy [facility] for tasks assessed/performed      Past Medical History:  Diagnosis Date  . Erosive esophagitis   . GERD (gastroesophageal reflux disease)   . HTN (hypertension)   . Internal hemorrhoids 12/02/2002    Past Surgical History:  Procedure Laterality Date  . APPENDECTOMY    . BUNIONECTOMY     bilateral x5  . COLONOSCOPY  10/07/2012   RMR: Colorectal polyps-treated and/or removed as described above. Colonic diverticulosis.(serrated adenomas) next TCS 5 years  . COLONOSCOPY W/ BIOPSIES  12/08/2002   RMR: Internal hemorrhoids, otherwise normal rectum Polyp at endocecum cold biopsied/removed Abnormal appearing ileocecal valve (of doubtful clinical significance)  biopsy showed chronic active colitis with significant lymphocytic infiltrate  . ESOPHAGOGASTRODUODENOSCOPY  04/28/2007   RMR: Distal esophageal erosions consistent with mild to moderate  reflux esophagitis, otherwise, normal esophagus, small hiatal hernia; otherwise, normal stomach, first and second duodenum  . HEMORRHOID SURGERY    . KIDNEY STONE SURGERY    . SHOULDER SURGERY     left    There were no vitals filed for this visit.       Subjective Assessment - 11/20/16 1039     Subjective Patient arrives stating that she just woke up one morning stiff and thought it was just a crick; she eventually went to the MD and got some medication. Over the weekend she could not move her neck at all and went to the ED, where she was told it was OA. She cannot turn her head so far and looking up is hard. She can do what she needs/wants to do but it is modified and painful. No numbness or tingling; no acue difficulty in opening jars/fine motor skills. No falls or close calls recently.    Pertinent History L shoulder surgery for bone spurs    How long can you sit comfortably? once she gets comfortable she is OK    How long can you stand comfortably? unlimited but painful    How long can you walk comfortably? unlimited    Patient Stated Goals get rid of neck pain    Currently in Pain? Yes   Pain Score 4    Pain Location Neck   Pain Orientation Medial   Pain Descriptors / Indicators Tightness   Pain Type Acute pain   Pain Radiating Towards none    Pain Onset More than a month ago   Pain Frequency Intermittent   Aggravating Factors  turning head, looking up    Pain Relieving Factors keeping still    Effect of Pain on Daily Activities just annoying and painful, does not stop her  [facility] PT Assessment - 11/20/16 0001      Assessment   Medical Diagnosis neck pain    Referring Provider Asencion Noble    Onset Date/Surgical Date --  about 2 months ago    Next MD Visit Dr. Willey Blade in March, not sure of exact date    Prior Therapy none      Precautions   Precaution Comments none known      Balance Screen   Has the patient fallen in the past 6 months No   Has the patient had a decrease in activity level because of a fear of falling?  No   Is the patient reluctant to leave their home because of a fear of falling?  No     Prior Function   Level of Independence Independent;Independent with basic ADLs;Independent with gait;Independent with transfers   Vocation Retired      Observation/Other Assessments   Observations cervical compression (+) for localized neck pain, no shooting symptoms; no change in symptoms with cervical traction      Posture/Postural Control   Posture/Postural Control Postural limitations   Postural Limitations Rounded Shoulders;Forward head;Increased thoracic kyphosis     AROM   Right Shoulder Flexion --  WFL    Right Shoulder ABduction --  [facility]    Right Shoulder Internal Rotation --  approx L3    Right Shoulder External Rotation --  severe limitation, unable to even touch back of neck    Left Shoulder Flexion --  WFL    Left Shoulder ABduction --  [facility]    Left Shoulder Internal Rotation --  approx L2    Left Shoulder External Rotation --  severe limitation, unable to even touch back of neck    Cervical Flexion 40   Cervical Extension 15   Cervical - Right Side Bend 17   Cervical - Left Side Bend 15   Cervical - Right Rotation 38   Cervical - Left Rotation 25   Thoracic Flexion moderate limitation    Thoracic Extension severe limitation    Thoracic - Right Side Bend approx 40% limited    Thoracic - Left Side Bend approx 40% limited    Thoracic - Right Rotation moderate limitation    Thoracic - Left Rotation severe limitation      Palpation   Palpation comment severe muscle knotting and spasm noted throughout B upper traps and cervical extensors, occipitals                           PT Education - 11/20/16 1622    Education provided Yes   Education Details prognosis, POC, HEP; role of physical activity/exercise in managing pain   Person(s) Educated Patient   Methods Explanation;Demonstration;Handout   Comprehension Verbalized understanding;Returned demonstration;Need further instruction          PT Short Term Goals - 11/20/16 1633      PT SHORT TERM GOAL #1   Title Patient to demonstrate cervical ROM by at least 20 degrees on all planes in order to reduce pain and improve mechanics    Time  3   Period Weeks   Status New     PT SHORT TERM GOAL #2   Title Patient will demonstrate thoracic ROM as being only minimally limited in order to improve posture and reduce pain    Time 3   Period Weeks   Status New     PT SHORT TERM GOAL #3  Title Patient to be able to maintain correct posture at least 75% of the time during all functional tasks without cues in order to improve mechanics/reduce pain    Time 3   Period Weeks   Status New     PT SHORT TERM GOAL #4   Title Patient to be able to reach at least C6-7 with B shoulder ER and to at least T10 with B shoulder IR in order to show improved mechanics and reduce pain    Time 3   Period Weeks   Status New           PT Long Term Goals - 11/20/16 1636      PT LONG TERM GOAL #1   Title Patient to experience pain no more than 3/10 in order to improve QOL and facilitate return to PLOF    Time 6   Period Weeks   Status New     PT LONG TERM GOAL #2   Title Patient to report she has been able to sleep through the night without pain exacerbation in order to demonstrate improvement of condition    Time 6   Period Weeks   Status New     PT LONG TERM GOAL #3   Title Patient demonstrate self-techniques for soft tissue massage with assistive device and will be familar with local massage therapists in order to assist in manage symptoms moving forward    Time 6   Period Weeks   Status New     PT LONG TERM GOAL #4   Title Patient to be independent in correctly and consistently performing appropriate advanced HEP, to be modified as appropraite    Time 6   Period Weeks   Status New               Plan - 11/20/16 1624    Clinical Impression Statement Patient arrives with ongoing neck pain that has been present for about 1-2 months; she reports that it really became bad this weekend and she went to the ED/was given additional medication which has reduced her pain, but without it her pain would be as high as 7-8/10.  Examination reveals severe stiffness in cervical and thoracic spines as well as in shoulder IR/ER movements, poor posture, and reduced ability to perform PLOF based tasks due to pain. Patient will benefit from skilled PT services in order to address functional deficits, reduce pain, and optimize level of function/return to PLOF.    Rehab Potential Good   PT Frequency 2x / week   PT Duration 6 weeks   PT Treatment/Interventions ADLs/Self Care Home Management;Biofeedback;Cryotherapy;Moist Heat;Gait training;Stair training;Functional mobility training;Therapeutic activities;Therapeutic exercise;Balance training;Neuromuscular re-education;Patient/family education;Manual techniques;Passive range of motion;Dry needling;Energy conservation;Taping   PT Next Visit Plan review initial eval/goals, HEP; focus on postural training, 3D cervical and thoracic excursions, postural strengthening    PT Home Exercise Plan Eval: 3D cervical and thoracic excursions    Consulted and Agree with Plan of Care Patient      Patient will benefit from skilled therapeutic intervention in order to improve the following deficits and impairments:  Improper body mechanics, Pain, Increased muscle spasms, Postural dysfunction, Decreased range of motion, Hypomobility, Decreased balance, Impaired flexibility  Visit Diagnosis: Cervicalgia - Plan: PT plan of care cert/re-cert  Poor posture - Plan: PT plan of care cert/re-cert  Other symptoms and signs involving the musculoskeletal system - Plan: PT plan of care cert/re-cert      G-Codes - [id number]    Functional  Assessment Tool Used (Outpatient Only) Based on skilled clinical assessment of ROM, posture, pain patterns, muscle flexibility    Functional Limitation Mobility: Walking and moving around   Mobility: Walking and Moving Around Current Status [id number]) At least 40 percent but less than 60 percent impaired, limited or restricted   Mobility: Walking and Moving Around Goal  Status [phone]) At least 20 percent but less than 40 percent impaired, limited or restricted       Problem List Patient Active Problem List   Diagnosis Date Noted  . Encounter for screening colonoscopy 09/08/2012  . Focal lymphocytic colitis 09/08/2012  . COLONIC POLYPS, HX OF 08/20/2010  . GERD 08/14/2010   Deniece Ree PT, DPT [place] [address] [place], [place], [address] Phone: [phone]   Fax:  [phone]  Name: Tricia Jenkins MRN: [id number] Date of Birth: 06/23/1941

## 2016-11-24 ENCOUNTER — Ambulatory Visit (HOSPITAL_COMMUNITY): Payer: Medicare Other | Admitting: Physical Therapy

## 2016-11-24 DIAGNOSIS — R293 Abnormal posture: Secondary | ICD-10-CM

## 2016-11-24 DIAGNOSIS — M542 Cervicalgia: Secondary | ICD-10-CM

## 2016-11-24 DIAGNOSIS — R29898 Other symptoms and signs involving the musculoskeletal system: Secondary | ICD-10-CM | POA: Diagnosis not present

## 2016-11-24 NOTE — Therapy (Signed)
[place], [place], [address] Phone: [phone]   Fax:  [phone]  Physical Therapy Treatment  Patient Details  Name: Tricia Jenkins MRN: [id number] Date of Birth: Mar 16, 1941 Referring Provider: Asencion Noble   Encounter Date: 11/24/2016      PT End of Session - 11/24/16 1546    Visit Number 2   Number of Visits 13   Date for PT Re-Evaluation 12/11/16   Authorization Type Medicare/Generic [employer]    Authorization Time Period 11/20/16 to 01/01/17   Authorization - Visit Number 2   Authorization - Number of Visits 10   PT Start Time [id number]   PT Stop Time 1542   PT Time Calculation (min) 24 min   Activity Tolerance Patient tolerated treatment well   Behavior During Therapy [facility] for tasks assessed/performed      Past Medical History:  Diagnosis Date  . Erosive esophagitis   . GERD (gastroesophageal reflux disease)   . HTN (hypertension)   . Internal hemorrhoids 12/02/2002    Past Surgical History:  Procedure Laterality Date  . APPENDECTOMY    . BUNIONECTOMY     bilateral x5  . COLONOSCOPY  10/07/2012   RMR: Colorectal polyps-treated and/or removed as described above. Colonic diverticulosis.(serrated adenomas) next TCS 5 years  . COLONOSCOPY W/ BIOPSIES  12/08/2002   RMR: Internal hemorrhoids, otherwise normal rectum Polyp at endocecum cold biopsied/removed Abnormal appearing ileocecal valve (of doubtful clinical significance)  biopsy showed chronic active colitis with significant lymphocytic infiltrate  . ESOPHAGOGASTRODUODENOSCOPY  04/28/2007   RMR: Distal esophageal erosions consistent with mild to moderate  reflux esophagitis, otherwise, normal esophagus, small hiatal hernia; otherwise, normal stomach, first and second duodenum  . HEMORRHOID SURGERY    . KIDNEY STONE SURGERY    . SHOULDER SURGERY     left    There were no vitals filed for this visit.      Subjective Assessment - 11/24/16 1523    Subjective  Pt states she is doing well today.  Sates she's been doing her HEP and can tell her neck motion has improved.  No pain currently unless she rotates far over her shoulders.   Currently in Pain? No/denies                         [facility] Adult PT Treatment/Exercise - 11/24/16 0001      Neck Exercises: Standing   Upper Extremity Flexion with Stabilization 10 reps     Neck Exercises: Seated   Neck Retraction 5 reps   Other Seated Exercise cervical and thoracic excursions review for HEP   Other Seated Exercise scapular retractions 10 reps     Manual Therapy   Manual Therapy Soft tissue mobilization   Manual therapy comments seperately from all other interventions   Soft tissue mobilization cervical seated to bilateral scap and upper trap muscles                 PT Education - 11/24/16 1547    Education provided Yes   Education Details reviewed HEP and goals per initial evaluation.  Given copy of initial evaluation   Person(s) Educated Patient   Methods Explanation;Demonstration;Tactile cues;Handout;Verbal cues   Comprehension Verbalized understanding;Returned demonstration;Verbal cues required          PT Short Term Goals - 11/20/16 1633      PT SHORT TERM GOAL #1   Title Patient to demonstrate cervical ROM by at  least 20 degrees on all planes in order to reduce pain and improve mechanics    Time 3   Period Weeks   Status New     PT SHORT TERM GOAL #2   Title Patient will demonstrate thoracic ROM as being only minimally limited in order to improve posture and reduce pain    Time 3   Period Weeks   Status New     PT SHORT TERM GOAL #3   Title Patient to be able to maintain correct posture at least 75% of the time during all functional tasks without cues in order to improve mechanics/reduce pain    Time 3   Period Weeks   Status New     PT SHORT TERM GOAL #4   Title Patient to be able to reach at least C6-7 with B shoulder ER and to at least T10  with B shoulder IR in order to show improved mechanics and reduce pain    Time 3   Period Weeks   Status New           PT Long Term Goals - 11/20/16 1636      PT LONG TERM GOAL #1   Title Patient to experience pain no more than 3/10 in order to improve QOL and facilitate return to PLOF    Time 6   Period Weeks   Status New     PT LONG TERM GOAL #2   Title Patient to report she has been able to sleep through the night without pain exacerbation in order to demonstrate improvement of condition    Time 6   Period Weeks   Status New     PT LONG TERM GOAL #3   Title Patient demonstrate self-techniques for soft tissue massage with assistive device and will be familar with local massage therapists in order to assist in manage symptoms moving forward    Time 6   Period Weeks   Status New     PT LONG TERM GOAL #4   Title Patient to be independent in correctly and consistently performing appropriate advanced HEP, to be modified as appropraite    Time 6   Period Weeks   Status New               Plan - 11/24/16 1550    Clinical Impression Statement reviewed HEP and evaluation goals.  Pt with overall improvement in ROM, howeer has increased discomfort with end range rotation.  Added UE flexion and began scapular retractions to help improve posture.  manual completed to cervical and scapular musculature with noted tightness but no spasms palpated.  Pt reported overall improvement in pain with end range of motion following session.  No new exercises added to HEP.   Rehab Potential Good   PT Frequency 2x / week   PT Duration 6 weeks   PT Treatment/Interventions ADLs/Self Care Home Management;Biofeedback;Cryotherapy;Moist Heat;Gait training;Stair training;Functional mobility training;Therapeutic activities;Therapeutic exercise;Balance training;Neuromuscular re-education;Patient/family education;Manual techniques;Passive range of motion;Dry needling;Energy conservation;Taping   PT  Next Visit Plan Continue with progression of postural strengthening.     PT Home Exercise Plan Eval: 3D cervical and thoracic excursions    Consulted and Agree with Plan of Care Patient      Patient will benefit from skilled therapeutic intervention in order to improve the following deficits and impairments:  Improper body mechanics, Pain, Increased muscle spasms, Postural dysfunction, Decreased range of motion, Hypomobility, Decreased balance, Impaired flexibility  Visit Diagnosis: Cervicalgia  Poor posture  Other symptoms and signs involving the musculoskeletal system     Problem List Patient Active Problem List   Diagnosis Date Noted  . Encounter for screening colonoscopy 09/08/2012  . Focal lymphocytic colitis 09/08/2012  . COLONIC POLYPS, HX OF 08/20/2010  . GERD 08/14/2010    Teena Irani, PTA/CLT [phone]  11/24/2016, 3:53 PM  [place] [address] [place], [place], [address] Phone: [phone]   Fax:  [phone]  Name: Tricia Jenkins MRN: [id number] Date of Birth: July 13, 1941

## 2016-12-02 ENCOUNTER — Encounter (HOSPITAL_COMMUNITY): Payer: Self-pay

## 2016-12-02 ENCOUNTER — Ambulatory Visit (HOSPITAL_COMMUNITY): Payer: Medicare Other

## 2016-12-02 DIAGNOSIS — L570 Actinic keratosis: Secondary | ICD-10-CM | POA: Diagnosis not present

## 2016-12-02 DIAGNOSIS — R29898 Other symptoms and signs involving the musculoskeletal system: Secondary | ICD-10-CM

## 2016-12-02 DIAGNOSIS — R293 Abnormal posture: Secondary | ICD-10-CM | POA: Diagnosis not present

## 2016-12-02 DIAGNOSIS — M542 Cervicalgia: Secondary | ICD-10-CM

## 2016-12-02 NOTE — Therapy (Signed)
Tricia Jenkins, [place], [address] Phone: [phone]   Fax:  [phone]  Physical Therapy Treatment  Patient Details  Name: Tricia Jenkins MRN: [id number] Date of Birth: [birth year] Referring Provider: Asencion Noble   Encounter Date: 12/02/2016      PT End of Session - 12/02/16 1356    Visit Number 3   Number of Visits 13   Date for PT Re-Evaluation 12/11/16   Authorization Type Medicare/Generic [employer]    Authorization Time Period 11/20/16 to 01/01/17   Authorization - Visit Number 3   Authorization - Number of Visits 10   PT Start Time 6283   PT Stop Time 1426   PT Time Calculation (min) 39 min   Activity Tolerance Patient tolerated treatment well;Patient limited by pain  Pain scale 5/10 Rt side cervical region through session   Behavior During Therapy [facility] for tasks assessed/performed      Past Medical History:  Diagnosis Date  . Erosive esophagitis   . GERD (gastroesophageal reflux disease)   . HTN (hypertension)   . Internal hemorrhoids 12/02/2002    Past Surgical History:  Procedure Laterality Date  . APPENDECTOMY    . BUNIONECTOMY     bilateral x5  . COLONOSCOPY  10/07/2012   RMR: Colorectal polyps-treated and/or removed as described above. Colonic diverticulosis.(serrated adenomas) next TCS 5 years  . COLONOSCOPY W/ BIOPSIES  12/08/2002   RMR: Internal hemorrhoids, otherwise normal rectum Polyp at endocecum cold biopsied/removed Abnormal appearing ileocecal valve (of doubtful clinical significance)  biopsy showed chronic active colitis with significant lymphocytic infiltrate  . ESOPHAGOGASTRODUODENOSCOPY  04/28/2007   RMR: Distal esophageal erosions consistent with mild to moderate  reflux esophagitis, otherwise, normal esophagus, small hiatal hernia; otherwise, normal stomach, first and second duodenum  . HEMORRHOID SURGERY    . KIDNEY STONE SURGERY    . SHOULDER SURGERY     left    There were no vitals  filed for this visit.      Subjective Assessment - 12/02/16 1346    Subjective Pt stated she has increased pain today, feels the weather may be related.  Current pain scale 5/10 dull achey pain.     Pertinent History L shoulder surgery for bone spurs    Patient Stated Goals get rid of neck pain    Currently in Pain? Yes   Pain Score 5    Pain Location Neck   Pain Orientation Right;Left;Posterior   Pain Descriptors / Indicators Dull;Aching;Constant   Pain Type Acute pain   Pain Radiating Towards none   Pain Onset More than a month ago   Pain Frequency Intermittent   Aggravating Factors  turning head, looking up   Pain Relieving Factors keeping still   Effect of Pain on Daily Activities just annoying and painful, does not stop her                         [facility] Adult PT Treatment/Exercise - 12/02/16 0001      Neck Exercises: Standing   Upper Extremity Flexion with Stabilization Flexion;10 reps     Neck Exercises: Seated   Neck Retraction 10 reps;3 secs   Shoulder Rolls Backwards;10 reps   Postural Training Educated importance of proper posture   Other Seated Exercise cervical and thoracic 3D excursion 10x eachj   Other Seated Exercise scapular retractions 10 reps     Manual Therapy   Manual Therapy Soft tissue mobilization  Manual therapy comments seperately from all other interventions   Soft tissue mobilization prone cervical/scapular regoin                  PT Short Term Goals - 11/20/16 1633      PT SHORT TERM GOAL #1   Title Patient to demonstrate cervical ROM by at least 20 degrees on all planes in order to reduce pain and improve mechanics    Time 3   Period Weeks   Status New     PT SHORT TERM GOAL #2   Title Patient will demonstrate thoracic ROM as being only minimally limited in order to improve posture and reduce pain    Time 3   Period Weeks   Status New     PT SHORT TERM GOAL #3   Title Patient to be able to maintain  correct posture at least 75% of the time during all functional tasks without cues in order to improve mechanics/reduce pain    Time 3   Period Weeks   Status New     PT SHORT TERM GOAL #4   Title Patient to be able to reach at least C6-7 with B shoulder ER and to at least T10 with B shoulder IR in order to show improved mechanics and reduce pain    Time 3   Period Weeks   Status New           PT Long Term Goals - 11/20/16 1636      PT LONG TERM GOAL #1   Title Patient to experience pain no more than 3/10 in order to improve QOL and facilitate return to PLOF    Time 6   Period Weeks   Status New     PT LONG TERM GOAL #2   Title Patient to report she has been able to sleep through the night without pain exacerbation in order to demonstrate improvement of condition    Time 6   Period Weeks   Status New     PT LONG TERM GOAL #3   Title Patient demonstrate self-techniques for soft tissue massage with assistive device and will be familar with local massage therapists in order to assist in manage symptoms moving forward    Time 6   Period Weeks   Status New     PT LONG TERM GOAL #4   Title Patient to be independent in correctly and consistently performing appropriate advanced HEP, to be modified as appropraite    Time 6   Period Weeks   Status New               Plan - 12/02/16 1453    Clinical Impression Statement Session focus on improving cervical mobiltiy and education with importance of proper posture.  Pt presents with decreased cervical mobility at entrance stating she feels is related to the weather.  Began session with cervical and thoracic excursions to improve mobility.  Added posterior shoulder rolls to relax upper trap and began posture strengthneing exercises with verbal and tactile cueing for proper form and technqiue.  EOS with manual cervical soft tissue mobilization to reduce overall upper trap tightness for pain control.  Encouraged pt to stay hydrated  and continue with HEP.     Rehab Potential Good   PT Frequency 2x / week   PT Duration 6 weeks   PT Treatment/Interventions ADLs/Self Care Home Management;Biofeedback;Cryotherapy;Moist Heat;Gait training;Stair training;Functional mobility training;Therapeutic activities;Therapeutic exercise;Balance training;Neuromuscular re-education;Patient/family education;Manual techniques;Passive range of motion;Dry needling;Energy  conservation;Taping   PT Next Visit Plan Continue with progression of postural strengthening.        Patient will benefit from skilled therapeutic intervention in order to improve the following deficits and impairments:  Improper body mechanics, Pain, Increased muscle spasms, Postural dysfunction, Decreased range of motion, Hypomobility, Decreased balance, Impaired flexibility  Visit Diagnosis: Cervicalgia  Poor posture  Other symptoms and signs involving the musculoskeletal system     Problem List Patient Active Problem List   Diagnosis Date Noted  . Encounter for screening colonoscopy 09/08/2012  . Focal lymphocytic colitis 09/08/2012  . COLONIC POLYPS, HX OF 08/20/2010  . GERD 08/14/2010   Ihor Austin, [place]; [place]  Aldona Lento 12/02/2016, 6:08 PM  [place] [address] [place], [place], [address] Phone: [phone]   Fax:  [phone]  Name: ROWAN POLLMAN MRN: [id number] Date of Birth: 01/06/1941

## 2016-12-04 ENCOUNTER — Ambulatory Visit (HOSPITAL_COMMUNITY): Payer: Medicare Other | Admitting: Physical Therapy

## 2016-12-04 DIAGNOSIS — R293 Abnormal posture: Secondary | ICD-10-CM | POA: Diagnosis not present

## 2016-12-04 DIAGNOSIS — M542 Cervicalgia: Secondary | ICD-10-CM

## 2016-12-04 DIAGNOSIS — R29898 Other symptoms and signs involving the musculoskeletal system: Secondary | ICD-10-CM | POA: Diagnosis not present

## 2016-12-04 NOTE — Therapy (Signed)
[place], [place], [address] Phone: [phone]   Fax:  [phone]  Physical Therapy Treatment  Patient Details  Name: MARNETTE PERKINS MRN: [id number] Date of Birth: [birth year] Referring Provider: Asencion Noble   Encounter Date: 12/04/2016      PT End of Session - 12/04/16 1108    Visit Number 4   Number of Visits 13   Date for PT Re-Evaluation 12/11/16   Authorization Type Medicare/Generic [employer]    Authorization Time Period 11/20/16 to 01/01/17   Authorization - Visit Number 4   Authorization - Number of Visits 10   PT Start Time 1030   PT Stop Time 1108   PT Time Calculation (min) 38 min   Activity Tolerance Patient tolerated treatment well;Patient limited by pain  Pain scale 5/10 Rt side cervical region through session   Behavior During Therapy [facility] for tasks assessed/performed      Past Medical History:  Diagnosis Date  . Erosive esophagitis   . GERD (gastroesophageal reflux disease)   . HTN (hypertension)   . Internal hemorrhoids 12/02/2002    Past Surgical History:  Procedure Laterality Date  . APPENDECTOMY    . BUNIONECTOMY     bilateral x5  . COLONOSCOPY  10/07/2012   RMR: Colorectal polyps-treated and/or removed as described above. Colonic diverticulosis.(serrated adenomas) next TCS 5 years  . COLONOSCOPY W/ BIOPSIES  12/08/2002   RMR: Internal hemorrhoids, otherwise normal rectum Polyp at endocecum cold biopsied/removed Abnormal appearing ileocecal valve (of doubtful clinical significance)  biopsy showed chronic active colitis with significant lymphocytic infiltrate  . ESOPHAGOGASTRODUODENOSCOPY  04/28/2007   RMR: Distal esophageal erosions consistent with mild to moderate  reflux esophagitis, otherwise, normal esophagus, small hiatal hernia; otherwise, normal stomach, first and second duodenum  . HEMORRHOID SURGERY    . KIDNEY STONE SURGERY    . SHOULDER SURGERY     left    There were no vitals  filed for this visit.      Subjective Assessment - 12/04/16 1041    Subjective Pt states Saturday she had to go to the ED due to pain down her Rt UE.   States she had more pain between her shoulder blades yesterday and had to take a pain pill.  Today it is not as bad at 3/10                         [facility] Adult PT Treatment/Exercise - 12/04/16 0001      Neck Exercises: Seated   Neck Retraction 10 reps;3 secs   Shoulder Rolls Backwards;10 reps   Other Seated Exercise cervical and thoracic 3D excursion 10x eachj   Other Seated Exercise scapular retractions 10 reps     Manual Therapy   Manual Therapy Soft tissue mobilization   Manual therapy comments seperately from all other interventions   Soft tissue mobilization supine for suboccipital release, prone for soft tissue work to cervical and thoracic regions.                  PT Short Term Goals - 11/20/16 1633      PT SHORT TERM GOAL #1   Title Patient to demonstrate cervical ROM by at least 20 degrees on all planes in order to reduce pain and improve mechanics    Time 3   Period Weeks   Status New     PT SHORT TERM GOAL #2   Title Patient  will demonstrate thoracic ROM as being only minimally limited in order to improve posture and reduce pain    Time 3   Period Weeks   Status New     PT SHORT TERM GOAL #3   Title Patient to be able to maintain correct posture at least 75% of the time during all functional tasks without cues in order to improve mechanics/reduce pain    Time 3   Period Weeks   Status New     PT SHORT TERM GOAL #4   Title Patient to be able to reach at least C6-7 with B shoulder ER and to at least T10 with B shoulder IR in order to show improved mechanics and reduce pain    Time 3   Period Weeks   Status New           PT Long Term Goals - 11/20/16 1636      PT LONG TERM GOAL #1   Title Patient to experience pain no more than 3/10 in order to improve QOL and facilitate  return to PLOF    Time 6   Period Weeks   Status New     PT LONG TERM GOAL #2   Title Patient to report she has been able to sleep through the night without pain exacerbation in order to demonstrate improvement of condition    Time 6   Period Weeks   Status New     PT LONG TERM GOAL #3   Title Patient demonstrate self-techniques for soft tissue massage with assistive device and will be familar with local massage therapists in order to assist in manage symptoms moving forward    Time 6   Period Weeks   Status New     PT LONG TERM GOAL #4   Title Patient to be independent in correctly and consistently performing appropriate advanced HEP, to be modified as appropraite    Time 6   Period Weeks   Status New               Plan - 12/04/16 1109    Clinical Impression Statement Continued with focus on improving cervical and thoracic mobility ensuring correct posturing. Pt reports trip to ED on Saturday due to radiating pain in Rt UE that was very severe.  completed some suboccipital release techniques in addition to soft tissue work to see if this helped with cerivical tension.  Most tightness in Rt scap and Lt upper trap regions this session but no spasms palpated, only tension.  Pt reported no pain at end of session.    Rehab Potential Good   PT Frequency 2x / week   PT Duration 6 weeks   PT Treatment/Interventions ADLs/Self Care Home Management;Biofeedback;Cryotherapy;Moist Heat;Gait training;Stair training;Functional mobility training;Therapeutic activities;Therapeutic exercise;Balance training;Neuromuscular re-education;Patient/family education;Manual techniques;Passive range of motion;Dry needling;Energy conservation;Taping   PT Next Visit Plan Continue with progression of postural strengthening next session with continued manual.        Patient will benefit from skilled therapeutic intervention in order to improve the following deficits and impairments:  Improper body  mechanics, Pain, Increased muscle spasms, Postural dysfunction, Decreased range of motion, Hypomobility, Decreased balance, Impaired flexibility  Visit Diagnosis: Cervicalgia  Poor posture  Other symptoms and signs involving the musculoskeletal system     Problem List Patient Active Problem List   Diagnosis Date Noted  . Encounter for screening colonoscopy 09/08/2012  . Focal lymphocytic colitis 09/08/2012  . COLONIC POLYPS, HX OF 08/20/2010  . GERD 08/14/2010  Teena Irani, PTA/CLT [phone]  12/04/2016, 11:15 AM  [place], [place], [address] Phone: [phone]   Fax:  [phone]  Name: GUDRUN AXE MRN: [id number] Date of Birth: 26-Jun-1941

## 2016-12-09 ENCOUNTER — Ambulatory Visit (HOSPITAL_COMMUNITY): Payer: Medicare Other

## 2016-12-09 DIAGNOSIS — M542 Cervicalgia: Secondary | ICD-10-CM

## 2016-12-09 DIAGNOSIS — R293 Abnormal posture: Secondary | ICD-10-CM

## 2016-12-09 DIAGNOSIS — R29898 Other symptoms and signs involving the musculoskeletal system: Secondary | ICD-10-CM | POA: Diagnosis not present

## 2016-12-09 NOTE — Therapy (Signed)
[facility] [place], [place], [address] Phone: [phone]   Fax:  [phone]  Physical Therapy Treatment  Patient Details  Name: Tricia Jenkins MRN: [id number] Date of Birth: 1941/06/17 Referring Provider: Asencion Jenkins   Encounter Date: 12/09/2016      PT End of Session - 12/09/16 1439    Visit Number 5   Number of Visits 13   Date for PT Re-Evaluation 12/11/16   Authorization Type Medicare/Generic Aet[employer]    Authorization Time Period 11/20/16 to 01/01/17   Authorization - Visit Number 5   Authorization - Number of Visits 10   PT Start Time 2703   PT Stop Time 5009   PT Time Calculation (min) 39 min   Activity Tolerance Patient tolerated treatment well;No increased pain   Behavior During Therapy WFL for tasks assessed/performed      Past Medical History:  Diagnosis Date  . Erosive esophagitis   . GERD (gastroesophageal reflux disease)   . HTN (hypertension)   . Internal hemorrhoids 12/02/2002    Past Surgical History:  Procedure Laterality Date  . APPENDECTOMY    . BUNIONECTOMY     bilateral x5  . COLONOSCOPY  10/07/2012   RMR: Colorectal polyps-treated and/or removed as described above. Colonic diverticulosis.(serrated adenomas) next TCS 5 years  . COLONOSCOPY W/ BIOPSIES  12/08/2002   RMR: Internal hemorrhoids, otherwise normal rectum Polyp at endocecum cold biopsied/removed Abnormal appearing ileocecal valve (of doubtful clinical significance)  biopsy showed chronic active colitis with significant lymphocytic infiltrate  . ESOPHAGOGASTRODUODENOSCOPY  04/28/2007   RMR: Distal esophageal erosions consistent with mild to moderate  reflux esophagitis, otherwise, normal esophagus, small hiatal hernia; otherwise, normal stomach, first and second duodenum  . HEMORRHOID SURGERY    . KIDNEY STONE SURGERY    . SHOULDER SURGERY     left    There were no vitals filed for this visit.      Subjective Assessment - 12/09/16  1436    Subjective Pt stated she is feeling good, feeels she is making improvements with ROM.  Does has increased pain with sidebending movements only that can increased to 5-6/10   Pertinent History L shoulder surgery for bone spurs    Patient Stated Goals get rid of neck pain    Currently in Pain? No/denies  Pain free without movements, increased to 6/10 with sidebending                         [facility] Adult PT Treatment/Exercise - 12/09/16 0001      Neck Exercises: Theraband   Scapula Retraction Limitations begin next session   Shoulder Extension 10 reps;Red   Shoulder Extension Limitations standing   Rows 10 reps;Red   Rows Limitations seated      Neck Exercises: Seated   Neck Retraction 3 secs;15 reps   W Back Limitations 10x with visual and verbal cueing for form   Shoulder Rolls Backwards;10 reps   Other Seated Exercise scapular retractions 10 reps     Manual Therapy   Manual Therapy Soft tissue mobilization   Manual therapy comments seperately from all other interventions   Soft tissue mobilization prone for soft tissue work to cervical and thoracic regions.                  PT Short Term Goals - 11/20/16 1633      PT SHORT TERM GOAL #1   Title Patient to demonstrate  cervical ROM by at least 20 degrees on all planes in order to reduce pain and improve mechanics    Time 3   Period Weeks   Status New     PT SHORT TERM GOAL #2   Title Patient will demonstrate thoracic ROM as being only minimally limited in order to improve posture and reduce pain    Time 3   Period Weeks   Status New     PT SHORT TERM GOAL #3   Title Patient to be able to maintain correct posture at least 75% of the time during all functional tasks without cues in order to improve mechanics/reduce pain    Time 3   Period Weeks   Status New     PT SHORT TERM GOAL #4   Title Patient to be able to reach at least C6-7 with B shoulder ER and to at least T10 with B shoulder  IR in order to show improved mechanics and reduce pain    Time 3   Period Weeks   Status New           PT Long Term Goals - 11/20/16 1636      PT LONG TERM GOAL #1   Title Patient to experience pain no more than 3/10 in order to improve QOL and facilitate return to PLOF    Time 6   Period Weeks   Status New     PT LONG TERM GOAL #2   Title Patient to report she has been able to sleep through the night without pain exacerbation in order to demonstrate improvement of condition    Time 6   Period Weeks   Status New     PT LONG TERM GOAL #3   Title Patient demonstrate self-techniques for soft tissue massage with assistive device and will be familar with local massage therapists in order to assist in manage symptoms moving forward    Time 6   Period Weeks   Status New     PT LONG TERM GOAL #4   Title Patient to be independent in correctly and consistently performing appropriate advanced HEP, to be modified as appropraite    Time 6   Period Weeks   Status New               Plan - 12/09/16 1512    Clinical Impression Statement Continued session foucs on improving cervical/thoracic mobility and posture strengthening.  Pt stated she is making good progress, reports she feels she has made 75% improvements with main issue being ability to sidebend wihtout pain as main deficit.  Continued with postural strenghtening and manual technqiues to address cervical soft tissue tightness, no spasms palpatted just generalized tightness.  Added theraband with posture strengthening with minimal cueing for proper form and technqiue.  No reports of pain through session.     Rehab Potential Good   PT Frequency 2x / week   PT Duration 6 weeks   PT Treatment/Interventions ADLs/Self Care Home Management;Biofeedback;Cryotherapy;Moist Heat;Gait training;Stair training;Functional mobility training;Therapeutic activities;Therapeutic exercise;Balance training;Neuromuscular re-education;Patient/family  education;Manual techniques;Passive range of motion;Dry needling;Energy conservation;Taping   PT Next Visit Plan Continue wiht progressing of cervical mobility primary sidebending, postural strengthening; add rectraction theraband exercises and posture infront of wall next session.  Continue wiht manual.        Patient will benefit from skilled therapeutic intervention in order to improve the following deficits and impairments:  Improper body mechanics, Pain, Increased muscle spasms, Postural dysfunction, Decreased range of motion,  Hypomobility, Decreased balance, Impaired flexibility  Visit Diagnosis: Cervicalgia  Poor posture  Other symptoms and signs involving the musculoskeletal system     Problem List Patient Active Problem List   Diagnosis Date Noted  . Encounter for screening colonoscopy 09/08/2012  . Focal lymphocytic colitis 09/08/2012  . COLONIC POLYPS, HX OF 08/20/2010  . GERD 08/14/2010   Ihor Austin, [place]; [place]  Aldona Lento 12/09/2016, 6:50 PM  [facility] [place], [place], [address] Phone: [phone]   Fax:  [phone]  Name: Tricia Jenkins MRN: [id number] Date of Birth: 1940/12/29

## 2016-12-11 ENCOUNTER — Ambulatory Visit (HOSPITAL_COMMUNITY): Payer: Medicare Other | Admitting: Physical Therapy

## 2016-12-11 DIAGNOSIS — M542 Cervicalgia: Secondary | ICD-10-CM | POA: Diagnosis not present

## 2016-12-11 DIAGNOSIS — R293 Abnormal posture: Secondary | ICD-10-CM | POA: Diagnosis not present

## 2016-12-11 DIAGNOSIS — R29898 Other symptoms and signs involving the musculoskeletal system: Secondary | ICD-10-CM | POA: Diagnosis not present

## 2016-12-11 NOTE — Therapy (Signed)
[facility] [place], [place], [address] Phone: [phone]   Fax:  [phone]  Physical Therapy Treatment  Patient Details  Name: Tricia Jenkins MRN: [id number] Date of Birth: 03/13/1941 Referring Provider: Asencion Noble   Encounter Date: 12/11/2016      PT End of Session - 12/11/16 1215    Visit Number 6   Number of Visits 13   Date for PT Re-Evaluation 12/11/16   Authorization Type Medicare/Generic [employer]    Authorization Time Period 11/20/16 to 01/01/17   Authorization - Visit Number 6   Authorization - Number of Visits 10   PT Start Time 1120   PT Stop Time 1200   PT Time Calculation (min) 40 min   Activity Tolerance Patient tolerated treatment well;No increased pain   Behavior During Therapy WFL for tasks assessed/performed      Past Medical History:  Diagnosis Date  . Erosive esophagitis   . GERD (gastroesophageal reflux disease)   . HTN (hypertension)   . Internal hemorrhoids 12/02/2002    Past Surgical History:  Procedure Laterality Date  . APPENDECTOMY    . BUNIONECTOMY     bilateral x5  . COLONOSCOPY  10/07/2012   RMR: Colorectal polyps-treated and/or removed as described above. Colonic diverticulosis.(serrated adenomas) next TCS 5 years  . COLONOSCOPY W/ BIOPSIES  12/08/2002   RMR: Internal hemorrhoids, otherwise normal rectum Polyp at endocecum cold biopsied/removed Abnormal appearing ileocecal valve (of doubtful clinical significance)  biopsy showed chronic active colitis with significant lymphocytic infiltrate  . ESOPHAGOGASTRODUODENOSCOPY  04/28/2007   RMR: Distal esophageal erosions consistent with mild to moderate  reflux esophagitis, otherwise, normal esophagus, small hiatal hernia; otherwise, normal stomach, first and second duodenum  . HEMORRHOID SURGERY    . KIDNEY STONE SURGERY    . SHOULDER SURGERY     left    There were no vitals filed for this visit.      Subjective Assessment - 12/11/16  1213    Subjective Pt states she only has pain with end ROM (i.e. cervical rotation or side bends).  Normal ROM and activities she has no pain.  When pain increases it goes to 6/10.   Currently in Pain? No/denies                         [facility] Adult PT Treatment/Exercise - 12/11/16 0001      Neck Exercises: Theraband   Scapula Retraction 10 reps;Red   Shoulder Extension 10 reps;Red   Rows 10 reps;Red     Neck Exercises: Seated   Neck Retraction 10 reps;5 secs   Shoulder Rolls Backwards;10 reps   Other Seated Exercise scapular retractions 10 reps     Manual Therapy   Manual Therapy Soft tissue mobilization;Myofascial release   Manual therapy comments seperately from all other interventions   Soft tissue mobilization supine to cervical musculature   Myofascial Release suboccipital release 3X                  PT Short Term Goals - 11/20/16 1633      PT SHORT TERM GOAL #1   Title Patient to demonstrate cervical ROM by at least 20 degrees on all planes in order to reduce pain and improve mechanics    Time 3   Period Weeks   Status New     PT SHORT TERM GOAL #2   Title Patient will demonstrate thoracic ROM as being only minimally  limited in order to improve posture and reduce pain    Time 3   Period Weeks   Status New     PT SHORT TERM GOAL #3   Title Patient to be able to maintain correct posture at least 75% of the time during all functional tasks without cues in order to improve mechanics/reduce pain    Time 3   Period Weeks   Status New     PT SHORT TERM GOAL #4   Title Patient to be able to reach at least C6-7 with B shoulder ER and to at least T10 with B shoulder IR in order to show improved mechanics and reduce pain    Time 3   Period Weeks   Status New           PT Long Term Goals - 11/20/16 1636      PT LONG TERM GOAL #1   Title Patient to experience pain no more than 3/10 in order to improve QOL and facilitate return to PLOF     Time 6   Period Weeks   Status New     PT LONG TERM GOAL #2   Title Patient to report she has been able to sleep through the night without pain exacerbation in order to demonstrate improvement of condition    Time 6   Period Weeks   Status New     PT LONG TERM GOAL #3   Title Patient demonstrate self-techniques for soft tissue massage with assistive device and will be familar with local massage therapists in order to assist in manage symptoms moving forward    Time 6   Period Weeks   Status New     PT LONG TERM GOAL #4   Title Patient to be independent in correctly and consistently performing appropriate advanced HEP, to be modified as appropraite    Time 6   Period Weeks   Status New               Plan - 12/11/16 1216    Clinical Impression Statement PT required tactile cues to complete theraband activities in correct form.  Good form observed with seated therex and cervical retractions. Manual completed to cervical area wtih minimal tightness noted, however subocciptiatls tight with release.  Pt verbalized overall improvement with further ROM obtained before feeling discomfort.    Rehab Potential Good   PT Frequency 2x / week   PT Duration 6 weeks   PT Treatment/Interventions ADLs/Self Care Home Management;Biofeedback;Cryotherapy;Moist Heat;Gait training;Stair training;Functional mobility training;Therapeutic activities;Therapeutic exercise;Balance training;Neuromuscular re-education;Patient/family education;Manual techniques;Passive range of motion;Dry needling;Energy conservation;Taping   PT Next Visit Plan Continue to improve postural strengthening and manual as needed.        Patient will benefit from skilled therapeutic intervention in order to improve the following deficits and impairments:  Improper body mechanics, Pain, Increased muscle spasms, Postural dysfunction, Decreased range of motion, Hypomobility, Decreased balance, Impaired flexibility  Visit  Diagnosis: Cervicalgia  Poor posture  Other symptoms and signs involving the musculoskeletal system     Problem List Patient Active Problem List   Diagnosis Date Noted  . Encounter for screening colonoscopy 09/08/2012  . Focal lymphocytic colitis 09/08/2012  . COLONIC POLYPS, HX OF 08/20/2010  . GERD 08/14/2010    Teena Irani, PTA/CLT [phone]  12/11/2016, 12:18 PM  [facility] [address] [place], [place], [address] Phone: [phone]   Fax:  [phone]  Name: ARDELLE HALIBURTON MRN: [id number]  Date of Birth: Apr 08, 1941

## 2016-12-16 ENCOUNTER — Ambulatory Visit (HOSPITAL_COMMUNITY): Payer: Medicare Other | Admitting: Physical Therapy

## 2016-12-16 DIAGNOSIS — R29898 Other symptoms and signs involving the musculoskeletal system: Secondary | ICD-10-CM | POA: Diagnosis not present

## 2016-12-16 DIAGNOSIS — M542 Cervicalgia: Secondary | ICD-10-CM

## 2016-12-16 DIAGNOSIS — R293 Abnormal posture: Secondary | ICD-10-CM | POA: Diagnosis not present

## 2016-12-16 NOTE — Patient Instructions (Signed)
   UPPER TRAP STRETCH  While sitting in a chair with good posture, drop your left ear to your left shoulder. Let gravity help and pull your head down.  You should feel a stretch on the right side of your neck.  Hold for at least 30 seconds and then come back up and switch sides.  Repeat 2-3 times each side, 1-2 times per day.    Levator Stretch- No Over Pressure   There are two parts to this stretch: first, look down towards your toes, and then rotate your head so you are almost trying to put your nose in your left armpit. Let gravity do the work and do not force the stretch with your neck muscles.   Hold for 30 seconds and come back up, repeat on the right side.  Repeat 2-3 times each side, 1-2 times per day.

## 2016-12-16 NOTE — Therapy (Signed)
[facility] [address] [place], [place], [address] Phone: [phone]   Fax:  [phone]  Physical Therapy Treatment (Re-Assessment)  Patient Details  Name: Tricia Jenkins MRN: [id number] Date of Birth: 12-17-1940 Referring Provider: Asencion Noble   Encounter Date: 12/16/2016      PT End of Session - 12/16/16 1155    Visit Number 7   Number of Visits 13   Authorization Type Medicare/Generic Aetna (G-codes and KX)   Authorization Time Period 11/20/16 to 01/01/17   Authorization - Visit Number 7   Authorization - Number of Visits 17   PT Start Time 1101   PT Stop Time 1142   PT Time Calculation (min) 41 min   Activity Tolerance Patient tolerated treatment well;No increased pain   Behavior During Therapy WFL for tasks assessed/performed      Past Medical History:  Diagnosis Date  . Erosive esophagitis   . GERD (gastroesophageal reflux disease)   . HTN (hypertension)   . Internal hemorrhoids 12/02/2002    Past Surgical History:  Procedure Laterality Date  . APPENDECTOMY    . BUNIONECTOMY     bilateral x5  . COLONOSCOPY  10/07/2012   RMR: Colorectal polyps-treated and/or removed as described above. Colonic diverticulosis.(serrated adenomas) next TCS 5 years  . COLONOSCOPY W/ BIOPSIES  12/08/2002   RMR: Internal hemorrhoids, otherwise normal rectum Polyp at endocecum cold biopsied/removed Abnormal appearing ileocecal valve (of doubtful clinical significance)  biopsy showed chronic active colitis with significant lymphocytic infiltrate  . ESOPHAGOGASTRODUODENOSCOPY  04/28/2007   RMR: Distal esophageal erosions consistent with mild to moderate  reflux esophagitis, otherwise, normal esophagus, small hiatal hernia; otherwise, normal stomach, first and second duodenum  . HEMORRHOID SURGERY    . KIDNEY STONE SURGERY    . SHOULDER SURGERY     left    There were no vitals filed for this visit.      Subjective Assessment - 12/16/16 1103     Subjective Patient arrives today stating that she feels she is improving; her main complaint is pain with end range lateral cervical flexion. She gives herself a "B" and reports that her last remaining concern is stiffness/pain with bending her head to the side.    Pertinent History L shoulder surgery for bone spurs    How long can you sit comfortably? 3/27- unlimited    How long can you stand comfortably? 3/27- 30 minutes   How long can you walk comfortably? 3/27- unlimited as long as she is not looking around    Patient Stated Goals get rid of neck pain    Currently in Pain? No/denies            [facility] PT Assessment - 12/16/16 0001      Assessment   Medical Diagnosis neck pain    Referring Provider Asencion Noble    Onset Date/Surgical Date --  2 years ago    Next MD Visit Dr. Willey Blade in May    Prior Therapy none      Precautions   Precaution Comments none known      Balance Screen   Has the patient fallen in the past 6 months No   Has the patient had a decrease in activity level because of a fear of falling?  No   Is the patient reluctant to leave their home because of a fear of falling?  No     Prior Function   Level of Independence Independent;Independent with basic ADLs;Independent with  gait;Independent with transfers   Vocation Retired     [employer]   Posture/Postural Control Postural limitations   Postural Limitations Rounded Shoulders;Forward head;Increased thoracic kyphosis     AROM   Right Shoulder Internal Rotation --  approx T10   Right Shoulder External Rotation --  approx T3    Left Shoulder Internal Rotation --  approx T11    Left Shoulder External Rotation --  approx T1    Cervical Flexion 40   Cervical Extension 21   Cervical - Right Side Bend 20   Cervical - Left Side Bend 21   Cervical - Right Rotation 40   Cervical - Left Rotation 40   Thoracic Flexion mild limitation    Thoracic Extension moderate limitation    Thoracic - Right  Side Bend approx 20% limited    Thoracic - Left Side Bend approx 20% limited    Thoracic - Right Rotation moderate limitation    Thoracic - Left Rotation moderate limitation      Palpation   Palpation comment much improved muslce knotting and spasm, however approx 30% of spasm/knotting remains                      [facility] Adult PT Treatment/Exercise - 12/16/16 0001      Neck Exercises: Seated   Neck Retraction 15 reps;5 secs   Neck Retraction Limitations form correction required    Shoulder Rolls Backwards;15 reps   Other Seated Exercise scapular retractions 1x15 5 second holds      Upper trap and levator stretches B 2x30 seconds each            PT Education - 12/16/16 1154    Education provided Yes   Education Details progress with skilled PT services, POC moving forward; HEP updates    Person(s) Educated Patient   Methods Explanation;Demonstration;Handout   Comprehension Verbalized understanding;Returned demonstration;Need further instruction          PT Short Term Goals - 12/16/16 1117      PT SHORT TERM GOAL #1   Title Patient to demonstrate cervical ROM by at least 20 degrees on all planes in order to reduce pain and improve mechanics    Baseline 3/27- improved but not at 20 degree point eyt    Time 3   Period Weeks   Status On-going     PT SHORT TERM GOAL #2   Title Patient will demonstrate thoracic ROM as being only minimally limited in order to improve posture and reduce pain    Baseline 3/27- some motions remain tight, otherwise have met goal    Time 3   Period Weeks   Status Partially Met     PT SHORT TERM GOAL #3   Title Patient to be able to maintain correct posture at least 75% of the time during all functional tasks without cues in order to improve mechanics/reduce pain    Baseline 3/27- improving    Time 3   Period Weeks   Status Partially Met     PT SHORT TERM GOAL #4   Title Patient to be able to reach at least C6-7 with B  shoulder ER and to at least T10 with B shoulder IR in order to show improved mechanics and reduce pain    Time 3   Period Weeks   Status Achieved           PT Long Term Goals - 12/16/16 1119      PT  LONG TERM GOAL #1   Title Patient to experience pain no more than 3/10 in order to improve QOL and facilitate return to PLOF    Baseline Dec 30, 2022- pain can still get up to a 6/10, depends on the weather; on a good, sunny day with no bad weather pain will only get to a 2/10   Time 6   Period Weeks   Status Partially Met     PT LONG TERM GOAL #2   Title Patient to report she has been able to sleep through the night without pain exacerbation in order to demonstrate improvement of condition    Baseline 30-Dec-2022- achieved    Time 6   Period Weeks   Status Achieved     PT LONG TERM GOAL #3   Title Patient demonstrate self-techniques for soft tissue massage with assistive device and will be familar with local massage therapists in order to assist in manage symptoms moving forward    Baseline 12/30/2022- discussed at eval    Time 6   Period Weeks   Status Partially Met     PT LONG TERM GOAL #4   Title Patient to be independent in correctly and consistently performing appropriate advanced HEP, to be modified as appropraite    Baseline 2022-12-30- reports compliance with current HEP   Time 6   Period Weeks   Status Partially Met               Plan - 12/29/16 1158    Clinical Impression Statement Re-assessment performed today. Patient appears to be making good progress with skilled PT services however does continue to demonstrate ongoing cervical stiffness, postural limitations, mild muscle knotting and spasm, and ongoing difficulty with intermittent bouts of increased pain. Patient will likely continue to benefit from skilled PT services, however reports she would "like to see how the last 3 sessions I have scheduled go and decide at the end". Will update POC moving forward PRN.    Rehab Potential  Good   PT Frequency 2x / week   PT Duration 3 weeks   PT Treatment/Interventions ADLs/Self Care Home Management;Biofeedback;Cryotherapy;Moist Heat;Gait training;Stair training;Functional mobility training;Therapeutic activities;Therapeutic exercise;Balance training;Neuromuscular re-education;Patient/family education;Manual techniques;Passive range of motion;Dry needling;Energy conservation;Taping   PT Next Visit Plan progress postural and cervical strength, continue stretches. Manual PRN    PT Home Exercise Plan Eval: 3D cervical and thoracic excursions; 12-30-2022: upper trap and levator stretches    Consulted and Agree with Plan of Care Patient      Patient will benefit from skilled therapeutic intervention in order to improve the following deficits and impairments:  Improper body mechanics, Pain, Increased muscle spasms, Postural dysfunction, Decreased range of motion, Hypomobility, Decreased balance, Impaired flexibility  Visit Diagnosis: Cervicalgia  Poor posture  Other symptoms and signs involving the musculoskeletal system       G-Codes - December 29, 2016 1159    Functional Assessment Tool Used (Outpatient Only) Based on skilled clinical assessment of ROM, posture, pain patterns, muscle flexibility    Functional Limitation Mobility: Walking and moving around   Mobility: Walking and Moving Around Current Status (H4765) At least 20 percent but less than 40 percent impaired, limited or restricted   Mobility: Walking and Moving Around Goal Status (Y6503) At least 1 percent but less than 20 percent impaired, limited or restricted      Problem List Patient Active Problem List   Diagnosis Date Noted  . Encounter for screening colonoscopy 09/08/2012  . Focal lymphocytic colitis 09/08/2012  . COLONIC  POLYPS, HX OF 08/20/2010  . GERD 08/14/2010    Deniece Ree PT, DPT [facility] [address] [place], [place], [address] Phone:  [phone]   Fax:  [phone]  Name: Tricia Jenkins MRN: [id number] Date of Birth: 1940-12-17

## 2016-12-18 ENCOUNTER — Ambulatory Visit (HOSPITAL_COMMUNITY): Payer: Medicare Other

## 2016-12-18 DIAGNOSIS — R293 Abnormal posture: Secondary | ICD-10-CM | POA: Diagnosis not present

## 2016-12-18 DIAGNOSIS — M542 Cervicalgia: Secondary | ICD-10-CM | POA: Diagnosis not present

## 2016-12-18 DIAGNOSIS — R29898 Other symptoms and signs involving the musculoskeletal system: Secondary | ICD-10-CM

## 2016-12-18 NOTE — Therapy (Signed)
[place], [place], [address] Phone: [phone]   Fax:  [phone]  Physical Therapy Treatment  Patient Details  Name: Tricia Jenkins MRN: [id number] Date of Birth: 26-Mar-1941 Referring Provider: Asencion Noble   Encounter Date: 12/18/2016      PT End of Session - 12/18/16 1325    Visit Number 8   Number of Visits 13   Date for PT Re-Evaluation 12/11/16   Authorization Type Medicare/Generic Aetna (G-codes and KX)   Authorization Time Period 11/20/16 to 01/01/17   Authorization - Visit Number 8   Authorization - Number of Visits 17   PT Start Time 1301   PT Stop Time 5053   PT Time Calculation (min) 42 min   Activity Tolerance Patient tolerated treatment well;No increased pain   Behavior During Therapy WFL for tasks assessed/performed      Past Medical History:  Diagnosis Date  . Erosive esophagitis   . GERD (gastroesophageal reflux disease)   . HTN (hypertension)   . Internal hemorrhoids 12/02/2002    Past Surgical History:  Procedure Laterality Date  . APPENDECTOMY    . BUNIONECTOMY     bilateral x5  . COLONOSCOPY  10/07/2012   RMR: Colorectal polyps-treated and/or removed as described above. Colonic diverticulosis.(serrated adenomas) next TCS 5 years  . COLONOSCOPY W/ BIOPSIES  12/08/2002   RMR: Internal hemorrhoids, otherwise normal rectum Polyp at endocecum cold biopsied/removed Abnormal appearing ileocecal valve (of doubtful clinical significance)  biopsy showed chronic active colitis with significant lymphocytic infiltrate  . ESOPHAGOGASTRODUODENOSCOPY  04/28/2007   RMR: Distal esophageal erosions consistent with mild to moderate  reflux esophagitis, otherwise, normal esophagus, small hiatal hernia; otherwise, normal stomach, first and second duodenum  . HEMORRHOID SURGERY    . KIDNEY STONE SURGERY    . SHOULDER SURGERY     left    There were no vitals filed for this visit.      Subjective  Assessment - 12/18/16 1302    Subjective Pt reports she has increased pain today, pain scale 6/10 Bil neck mm and between shoulder blades.  Increased pain with movement.   Pertinent History L shoulder surgery for bone spurs    Patient Stated Goals get rid of neck pain    Currently in Pain? Yes   Pain Score 6    Pain Location Neck   Pain Orientation Right;Left;Posterior   Pain Type Acute pain   Pain Radiating Towards none   Pain Onset More than a month ago   Pain Frequency Intermittent   Aggravating Factors  turning head, looking up   Pain Relieving Factors keeping still   Effect of Pain on Daily Activities just annoying and painful, does not stop her              [facility] Adult PT Treatment/Exercise - 12/18/16 0001      Neck Exercises: Theraband   Scapula Retraction 15 reps;Red   Shoulder Extension 15 reps;Red   Rows 15 reps;Red     Neck Exercises: Standing   Other Standing Exercises wall arches 10x     Neck Exercises: Seated   Neck Retraction 15 reps;5 secs   Neck Retraction Limitations form correction required    Shoulder Rolls Backwards;15 reps   Other Seated Exercise 3D thoracic excursion   Other Seated Exercise scapular retractions 1x15 5 second holds      Manual Therapy   Manual Therapy Soft tissue mobilization   Manual therapy comments seperately from  all other interventions   Soft tissue mobilization prone to upper trap, levator, scalenes     Neck Exercises: Stretches   Upper Trapezius Stretch 2 reps;30 seconds  cueing for form   Levator Stretch 2 reps;30 seconds  cueing for form   Corner Stretch 3 reps;30 seconds                  PT Short Term Goals - 12/16/16 1117      PT SHORT TERM GOAL #1   Title Patient to demonstrate cervical ROM by at least 20 degrees on all planes in order to reduce pain and improve mechanics    Baseline 3/27- improved but not at 20 degree point eyt    Time 3   Period Weeks   Status On-going     PT SHORT TERM GOAL  #2   Title Patient will demonstrate thoracic ROM as being only minimally limited in order to improve posture and reduce pain    Baseline 3/27- some motions remain tight, otherwise have met goal    Time 3   Period Weeks   Status Partially Met     PT SHORT TERM GOAL #3   Title Patient to be able to maintain correct posture at least 75% of the time during all functional tasks without cues in order to improve mechanics/reduce pain    Baseline 3/27- improving    Time 3   Period Weeks   Status Partially Met     PT SHORT TERM GOAL #4   Title Patient to be able to reach at least C6-7 with B shoulder ER and to at least T10 with B shoulder IR in order to show improved mechanics and reduce pain    Time 3   Period Weeks   Status Achieved           PT Long Term Goals - 12/16/16 1119      PT LONG TERM GOAL #1   Title Patient to experience pain no more than 3/10 in order to improve QOL and facilitate return to PLOF    Baseline 3/27- pain can still get up to a 6/10, depends on the weather; on a good, sunny day with no bad weather pain will only get to a 2/10   Time 6   Period Weeks   Status Partially Met     PT LONG TERM GOAL #2   Title Patient to report she has been able to sleep through the night without pain exacerbation in order to demonstrate improvement of condition    Baseline 3/27- achieved    Time 6   Period Weeks   Status Achieved     PT LONG TERM GOAL #3   Title Patient demonstrate self-techniques for soft tissue massage with assistive device and will be familar with local massage therapists in order to assist in manage symptoms moving forward    Baseline 3/27- discussed at eval    Time 6   Period Weeks   Status Partially Met     PT LONG TERM GOAL #4   Title Patient to be independent in correctly and consistently performing appropriate advanced HEP, to be modified as appropraite    Baseline 3/27- reports compliance with current HEP   Time 6   Period Weeks   Status  Partially Met               Plan - 12/18/16 1328    Clinical Impression Statement Began session with manual to address cervical restrictions/  spams in Upper traps for pain control.  Reviewed form with cervical stretches given last session min cueing for form and hold times.  Pt able to demonstrate approriate form/tech with postural strengthening therex with minimal therapist facilitation.  EOS no reports of increased pain.     Rehab Potential Good   PT Frequency 2x / week   PT Duration 3 weeks   PT Treatment/Interventions ADLs/Self Care Home Management;Biofeedback;Cryotherapy;Moist Heat;Gait training;Stair training;Functional mobility training;Therapeutic activities;Therapeutic exercise;Balance training;Neuromuscular re-education;Patient/family education;Manual techniques;Passive range of motion;Dry needling;Energy conservation;Taping   PT Next Visit Plan progress postural and cervical strength, continue stretches. Manual PRN    PT Home Exercise Plan Eval: 3D cervical and thoracic excursions; 3/27: upper trap and levator stretches       Patient will benefit from skilled therapeutic intervention in order to improve the following deficits and impairments:  Improper body mechanics, Pain, Increased muscle spasms, Postural dysfunction, Decreased range of motion, Hypomobility, Decreased balance, Impaired flexibility  Visit Diagnosis: Cervicalgia  Poor posture  Other symptoms and signs involving the musculoskeletal system     Problem List Patient Active Problem List   Diagnosis Date Noted  . Encounter for screening colonoscopy 09/08/2012  . Focal lymphocytic colitis 09/08/2012  . COLONIC POLYPS, HX OF 08/20/2010  . GERD 08/14/2010   Ihor Austin, [place]; [place]  Aldona Lento 12/18/2016, 1:45 PM  [place], [place], [address] Phone: [phone]   Fax:  [phone]  Name: Tricia Jenkins MRN: [id number] Date of Birth: 10/26/1940

## 2016-12-22 ENCOUNTER — Ambulatory Visit (HOSPITAL_COMMUNITY): Payer: Medicare Other | Attending: Internal Medicine | Admitting: Physical Therapy

## 2016-12-22 DIAGNOSIS — R293 Abnormal posture: Secondary | ICD-10-CM | POA: Diagnosis not present

## 2016-12-22 DIAGNOSIS — R29898 Other symptoms and signs involving the musculoskeletal system: Secondary | ICD-10-CM | POA: Diagnosis not present

## 2016-12-22 DIAGNOSIS — M542 Cervicalgia: Secondary | ICD-10-CM | POA: Diagnosis not present

## 2016-12-22 NOTE — Therapy (Signed)
[place], [place], [address] Phone: [phone]   Fax:  [phone]  Physical Therapy Treatment  Patient Details  Name: Tricia Jenkins MRN: [id number] Date of Birth: Aug 25, 1941 Referring Provider: Asencion Noble   Encounter Date: 12/22/2016      PT End of Session - 12/22/16 1038    Visit Number 9   Number of Visits 13   Date for PT Re-Evaluation 12/11/16   Authorization Type Medicare/Generic Aetna (G-codes and KX)   Authorization Time Period 11/20/16 to 01/01/17   Authorization - Visit Number 9   Authorization - Number of Visits 17   PT Start Time 3532   PT Stop Time 1114   PT Time Calculation (min) 38 min   Activity Tolerance Patient tolerated treatment well;No increased pain   Behavior During Therapy WFL for tasks assessed/performed      Past Medical History:  Diagnosis Date  . Erosive esophagitis   . GERD (gastroesophageal reflux disease)   . HTN (hypertension)   . Internal hemorrhoids 12/02/2002    Past Surgical History:  Procedure Laterality Date  . APPENDECTOMY    . BUNIONECTOMY     bilateral x5  . COLONOSCOPY  10/07/2012   RMR: Colorectal polyps-treated and/or removed as described above. Colonic diverticulosis.(serrated adenomas) next TCS 5 years  . COLONOSCOPY W/ BIOPSIES  12/08/2002   RMR: Internal hemorrhoids, otherwise normal rectum Polyp at endocecum cold biopsied/removed Abnormal appearing ileocecal valve (of doubtful clinical significance)  biopsy showed chronic active colitis with significant lymphocytic infiltrate  . ESOPHAGOGASTRODUODENOSCOPY  04/28/2007   RMR: Distal esophageal erosions consistent with mild to moderate  reflux esophagitis, otherwise, normal esophagus, small hiatal hernia; otherwise, normal stomach, first and second duodenum  . HEMORRHOID SURGERY    . KIDNEY STONE SURGERY    . SHOULDER SURGERY     left    There were no vitals filed for this visit.      Subjective  Assessment - 12/22/16 1116    Subjective Pt states she is doing well today, overall improving.  No pain reported.    Currently in Pain? No/denies                         [facility] Adult PT Treatment/Exercise - 12/22/16 0001      Neck Exercises: Theraband   Scapula Retraction 15 reps;Red   Shoulder Extension 15 reps;Red   Rows 15 reps;Red     Neck Exercises: Seated   Neck Retraction 15 reps;5 secs   Neck Retraction Limitations form correction required    Shoulder Rolls Backwards;15 reps   Other Seated Exercise scapular retractions 1x15 5 second holds      Neck Exercises: Prone   Shoulder Extension 15 reps   Other Prone Exercise scap retractions 15 reps     Manual Therapy   Manual Therapy Soft tissue mobilization   Manual therapy comments seperately from all other interventions   Soft tissue mobilization prone to upper trap, levator, scalenes                  PT Short Term Goals - 12/16/16 1117      PT SHORT TERM GOAL #1   Title Patient to demonstrate cervical ROM by at least 20 degrees on all planes in order to reduce pain and improve mechanics    Baseline 3/27- improved but not at 20 degree point eyt    Time 3   Period Weeks  Status On-going     PT SHORT TERM GOAL #2   Title Patient will demonstrate thoracic ROM as being only minimally limited in order to improve posture and reduce pain    Baseline 3/27- some motions remain tight, otherwise have met goal    Time 3   Period Weeks   Status Partially Met     PT SHORT TERM GOAL #3   Title Patient to be able to maintain correct posture at least 75% of the time during all functional tasks without cues in order to improve mechanics/reduce pain    Baseline 3/27- improving    Time 3   Period Weeks   Status Partially Met     PT SHORT TERM GOAL #4   Title Patient to be able to reach at least C6-7 with B shoulder ER and to at least T10 with B shoulder IR in order to show improved mechanics and reduce  pain    Time 3   Period Weeks   Status Achieved           PT Long Term Goals - 12/16/16 1119      PT LONG TERM GOAL #1   Title Patient to experience pain no more than 3/10 in order to improve QOL and facilitate return to PLOF    Baseline 3/27- pain can still get up to a 6/10, depends on the weather; on a good, sunny day with no bad weather pain will only get to a 2/10   Time 6   Period Weeks   Status Partially Met     PT LONG TERM GOAL #2   Title Patient to report she has been able to sleep through the night without pain exacerbation in order to demonstrate improvement of condition    Baseline 3/27- achieved    Time 6   Period Weeks   Status Achieved     PT LONG TERM GOAL #3   Title Patient demonstrate self-techniques for soft tissue massage with assistive device and will be familar with local massage therapists in order to assist in manage symptoms moving forward    Baseline 3/27- discussed at eval    Time 6   Period Weeks   Status Partially Met     PT LONG TERM GOAL #4   Title Patient to be independent in correctly and consistently performing appropriate advanced HEP, to be modified as appropraite    Baseline 3/27- reports compliance with current HEP   Time 6   Period Weeks   Status Partially Met               Plan - 12/22/16 1113    Clinical Impression Statement completed manual in prone this session (pt reported better results/most comfortable).  Also progressed to prone scapular strengthening exericises.  continues to require cues for form with theraband exercises.  Noted spasms in Rt scap region resolved with manual, no tightness or orther spasms noted throughout cervical and scap region.  No pain at end of session.    Rehab Potential Good   PT Frequency 2x / week   PT Duration 3 weeks   PT Treatment/Interventions ADLs/Self Care Home Management;Biofeedback;Cryotherapy;Moist Heat;Gait training;Stair training;Functional mobility training;Therapeutic  activities;Therapeutic exercise;Balance training;Neuromuscular re-education;Patient/family education;Manual techniques;Passive range of motion;Dry needling;Energy conservation;Taping   PT Next Visit Plan Continue with postural and cervical strengthening.  Add prone cervical retractions and UE flexion next session.  Continue with manual.   PT Home Exercise Plan Eval: 3D cervical and thoracic excursions; 3/27: upper trap  and levator stretches       Patient will benefit from skilled therapeutic intervention in order to improve the following deficits and impairments:  Improper body mechanics, Pain, Increased muscle spasms, Postural dysfunction, Decreased range of motion, Hypomobility, Decreased balance, Impaired flexibility  Visit Diagnosis: Cervicalgia  Poor posture  Other symptoms and signs involving the musculoskeletal system     Problem List Patient Active Problem List   Diagnosis Date Noted  . Encounter for screening colonoscopy 09/08/2012  . Focal lymphocytic colitis 09/08/2012  . COLONIC POLYPS, HX OF 08/20/2010  . GERD 08/14/2010    Teena Irani, PTA/CLT [phone]  12/22/2016, 11:17 AM  [place], [place], [address] Phone: [phone]   Fax:  [phone]  Name: Tricia Jenkins MRN: [id number] Date of Birth: July 18, 1941

## 2016-12-25 ENCOUNTER — Ambulatory Visit (HOSPITAL_COMMUNITY): Payer: Medicare Other

## 2016-12-25 DIAGNOSIS — R293 Abnormal posture: Secondary | ICD-10-CM | POA: Diagnosis not present

## 2016-12-25 DIAGNOSIS — R29898 Other symptoms and signs involving the musculoskeletal system: Secondary | ICD-10-CM | POA: Diagnosis not present

## 2016-12-25 DIAGNOSIS — M542 Cervicalgia: Secondary | ICD-10-CM

## 2016-12-25 NOTE — Therapy (Signed)
[place], [place], [address] Phone: [phone]   Fax:  [phone]  Physical Therapy Treatment (Discharge)  Patient Details  Name: Tricia Jenkins MRN: [id number] Date of Birth: 28-May-1941 Referring Provider: Asencion Noble   Encounter Date: 12/25/2016      PT End of Session - 12/25/16 1033    Visit Number 10   Number of Visits 13   Date for PT Re-Evaluation 01/01/17   Authorization Type Medicare/Generic Aetna (G-codes and KX)   Authorization Time Period 11/20/16 to 01/01/17   Authorization - Visit Number 10   Authorization - Number of Visits 17   PT Start Time 1025   PT Stop Time 1105   PT Time Calculation (min) 40 min   Activity Tolerance Patient tolerated treatment well;No increased pain   Behavior During Therapy WFL for tasks assessed/performed      Past Medical History:  Diagnosis Date  . Erosive esophagitis   . GERD (gastroesophageal reflux disease)   . HTN (hypertension)   . Internal hemorrhoids 12/02/2002    Past Surgical History:  Procedure Laterality Date  . APPENDECTOMY    . BUNIONECTOMY     bilateral x5  . COLONOSCOPY  10/07/2012   RMR: Colorectal polyps-treated and/or removed as described above. Colonic diverticulosis.(serrated adenomas) next TCS 5 years  . COLONOSCOPY W/ BIOPSIES  12/08/2002   RMR: Internal hemorrhoids, otherwise normal rectum Polyp at endocecum cold biopsied/removed Abnormal appearing ileocecal valve (of doubtful clinical significance)  biopsy showed chronic active colitis with significant lymphocytic infiltrate  . ESOPHAGOGASTRODUODENOSCOPY  04/28/2007   RMR: Distal esophageal erosions consistent with mild to moderate  reflux esophagitis, otherwise, normal esophagus, small hiatal hernia; otherwise, normal stomach, first and second duodenum  . HEMORRHOID SURGERY    . KIDNEY STONE SURGERY    . SHOULDER SURGERY     left    There were no vitals filed for this visit.       Subjective Assessment - 12/25/16 1031    Subjective Pt stated she is doing good today, no reports of pain today.  Reports most difficulty with sidebending and posture awareness, stated she has increased awareness but finds she slumps down over time     Currently in Pain? No/denies                         [facility] Adult PT Treatment/Exercise - 12/25/16 0001      Neck Exercises: Theraband   Scapula Retraction 15 reps;Red   Scapula Retraction Limitations HEP   Shoulder Extension 15 reps;Red   Shoulder Extension Limitations HEP   Rows 15 reps;Red   Rows Limitations HEP     Neck Exercises: Seated   Neck Retraction 15 reps;5 secs   Neck Retraction Limitations good form noted with L hand shape   Shoulder Rolls Backwards;15 reps     Neck Exercises: Prone   Neck Retraction 15 reps   Neck Retraction Limitations cueing for form initially   W Back 15 reps   Shoulder Extension 15 reps   Other Prone Exercise scap retractions 15 reps   Other Prone Exercise row 15x     Manual Therapy   Manual Therapy Soft tissue mobilization   Manual therapy comments seperately from all other interventions   Soft tissue mobilization prone to upper trap, levator, scalenes                  PT Short Term Goals -  12/25/16 1110      PT SHORT TERM GOAL #1   Title Patient to demonstrate cervical ROM by at least 20 degrees on all planes in order to reduce pain and improve mechanics    Baseline 3/27- improved but not at 20 degree point eyt    Status On-going     PT SHORT TERM GOAL #2   Title Patient will demonstrate thoracic ROM as being only minimally limited in order to improve posture and reduce pain    Baseline 3/27- some motions remain tight, otherwise have met goal    Status Partially Met     PT SHORT TERM GOAL #3   Title Patient to be able to maintain correct posture at least 75% of the time during all functional tasks without cues in order to improve mechanics/reduce pain     Baseline 4/5:  Ability to present with correct posture during session with no cueing required   Status Achieved     PT SHORT TERM GOAL #4   Title Patient to be able to reach at least C6-7 with B shoulder ER and to at least T10 with B shoulder IR in order to show improved mechanics and reduce pain    Status Achieved           PT Long Term Goals - 12/25/16 1111      PT LONG TERM GOAL #1   Title Patient to experience pain no more than 3/10 in order to improve QOL and facilitate return to PLOF    Baseline 4/5:  Reports of pain free for weeks    Status Achieved     PT LONG TERM GOAL #2   Title Patient to report she has been able to sleep through the night without pain exacerbation in order to demonstrate improvement of condition    Baseline 3/27- achieved    Status Achieved     PT LONG TERM GOAL #3   Title Patient demonstrate self-techniques for soft tissue massage with assistive device and will be familar with local massage therapists in order to assist in manage symptoms moving forward    Status Partially Met     PT LONG TERM GOAL #4   Title Patient to be independent in correctly and consistently performing appropriate advanced HEP, to be modified as appropraite    Status Achieved               Plan - 12/25/16 1102    Clinical Impression Statement Pt arrived wtih reports of compliance with HEP, improved awareness of posture and reports of pain free for weeks.  Continued session focus on advanced postural strengthening in prone exercises.  Pt able to complete new exercises with good form and technqiue following initial instructions.  EOS with manual to address spasms with minimal tightness noted on scalenes and Rt scapular region, resolved with STM.  Reviewed goals and pt given advanced HEP with ability to verbalize and demonstrate appropriately.  Discussed goals with pt and evaluation PT with no further skilled intervention required.     Rehab Potential Good   PT Frequency 2x  / week   PT Duration 3 weeks   PT Treatment/Interventions ADLs/Self Care Home Management;Biofeedback;Cryotherapy;Moist Heat;Gait training;Stair training;Functional mobility training;Therapeutic activities;Therapeutic exercise;Balance training;Neuromuscular re-education;Patient/family education;Manual techniques;Passive range of motion;Dry needling;Energy conservation;Taping   PT Next Visit Plan D/C to HEP    PT Home Exercise Plan Eval: 3D cervical and thoracic excursions; 3/27: upper trap and levator stretches 4/5: theraband posture strengthening.  Patient will benefit from skilled therapeutic intervention in order to improve the following deficits and impairments:  Improper body mechanics, Pain, Increased muscle spasms, Postural dysfunction, Decreased range of motion, Hypomobility, Decreased balance, Impaired flexibility  Visit Diagnosis: Cervicalgia  Poor posture  Other symptoms and signs involving the musculoskeletal system    Problem List Patient Active Problem List   Diagnosis Date Noted  . Encounter for screening colonoscopy 09/08/2012  . Focal lymphocytic colitis 09/08/2012  . COLONIC POLYPS, HX OF 08/20/2010  . GERD 08/14/2010   Ihor Austin, LPTA; CBIS [phone]      G-Codes - January 08, 2017 1215    Functional Assessment Tool Used (Outpatient Only) Based on skilled clinical assessment of ROM, posture, pain patterns, muscle flexibility    Mobility: Walking and Moving Around Goal Status [phone]) At least 1 percent but less than 20 percent impaired, limited or restricted   Mobility: Walking and Moving Around Discharge Status [phone]) At least 1 percent but less than 20 percent impaired, limited or restricted      PHYSICAL THERAPY DISCHARGE SUMMARY  Visits from Start of Care: 10  Current functional level related to goals / functional outcomes: Patient doing very well, appropriate for DC at this time.    Remaining deficits: Postural deficits, mild cervical ROM  deficits   Education / Equipment: DC today, advanced HEP  Plan: Patient agrees to discharge.  Patient goals were partially met. Patient is being discharged due to being pleased with the current functional level.  ?????    Deniece Ree PT, DPT [place] [address] [place], [place], [address] Phone: [phone]   Fax:  [phone]  Name: KELANI ROBART MRN: [id number] Date of Birth: Mar 17, 1941

## 2016-12-29 ENCOUNTER — Ambulatory Visit (HOSPITAL_COMMUNITY)
Admission: RE | Admit: 2016-12-29 | Discharge: 2016-12-29 | Disposition: A | Discharge status: Home / Self Care | Payer: Medicare Other | Source: Ambulatory Visit | Attending: Internal Medicine | Admitting: Internal Medicine

## 2016-12-29 ENCOUNTER — Other Ambulatory Visit (HOSPITAL_COMMUNITY): Payer: Self-pay | Admitting: Internal Medicine

## 2016-12-29 DIAGNOSIS — M542 Cervicalgia: Secondary | ICD-10-CM

## 2016-12-29 DIAGNOSIS — M5030 Other cervical disc degeneration, unspecified cervical region: Secondary | ICD-10-CM | POA: Diagnosis not present

## 2016-12-29 DIAGNOSIS — M50321 Other cervical disc degeneration at C4-C5 level: Secondary | ICD-10-CM | POA: Diagnosis not present

## 2017-01-15 ENCOUNTER — Ambulatory Visit (INDEPENDENT_AMBULATORY_CARE_PROVIDER_SITE_OTHER): Payer: Medicare Other | Admitting: Orthopaedic Surgery

## 2017-01-15 ENCOUNTER — Encounter (INDEPENDENT_AMBULATORY_CARE_PROVIDER_SITE_OTHER): Payer: Self-pay | Admitting: Orthopaedic Surgery

## 2017-01-15 VITALS — BP 140/66 | HR 64 | Ht 65.5 in | Wt 180.0 lb

## 2017-01-15 DIAGNOSIS — M47812 Spondylosis without myelopathy or radiculopathy, cervical region: Secondary | ICD-10-CM | POA: Diagnosis not present

## 2017-01-15 NOTE — Progress Notes (Signed)
Office Visit Note   Patient: Tricia Jenkins           Date of Birth: Nov 15, 1940           MRN: [id number] Visit Date: 01/15/2017              Requested by: Asencion Noble, MD [address] [place], [place] [address] PCP: Asencion Noble, MD   Assessment & Plan: Visit Diagnoses:  1. Spondylosis without myelopathy or radiculopathy, cervical region     Plan: Patient's about 75% improved with previous treatment including therapy and prednisone both the IM shot and also oral. We will defer cervical imaging at this point since she is significantly improved. If she gets recurrence of symptoms she will call and we'll proceed with cervical MRI scan and office follow-up after her scan. She has significant spondylitic changes from the C3 down to the C6 level. She not having enough symptoms to consider further diagnostic workup and if her symptoms recur she will call us.  Follow-Up Instructions: No Follow-up on file.   Orders:  No orders of the defined types were placed in this encounter.  No orders of the defined types were placed in this encounter.     Procedures: No procedures performed   Clinical Data: No additional findings.   Subjective: Chief Complaint  Patient presents with  . Neck - Pain    HPI patient's had significant neck pain that radiates into her shoulders and down her right arm. Just past the elbow. Pain did not radiate and into her hands. She has 2 sets of x-rays which demonstrated spondylitic changes C3-4, Steve 45, and C5-6. Some anterolisthesis at C7-T1. She's been through physical therapy for 6 weeks. She went to the emergency room due to increased pain. She was given Norco 10/325 one by mouth every 6 for neck pain and has been on it for a few weeks and is now out of medication. He also had some Flexeril and a prednisone shot and oral pack. Currently she thinks she maybe about 75% improved.  Review of Systems 14 point review of systems positive for GERD, colonic  polyps, focal lymphocytic colitis, neck pain 8 weeks, kidney stone surgery, bunionette excision from her fifth toes, and right shoulder surgery arthroscopic.   Objective: Vital Signs: BP 140/66   Pulse 64   Ht 5' 5.5" (1.664 m)   Wt 180 lb (81.6 kg)   BMI 29.50 kg/m   Physical Exam  Constitutional: She is oriented to person, place, and time. She appears well-developed.  HENT:  Head: Normocephalic.  Right Ear: External ear normal.  Left Ear: External ear normal.  Upper denture plate.  Eyes: Pupils are equal, round, and reactive to light.  No glasses since she had her cataract surgery she's reading.  Neck: No tracheal deviation present. No thyromegaly present.  Cardiovascular: Normal rate.   Pulmonary/Chest: Effort normal.  Abdominal: Soft.  Musculoskeletal:  Patient has brachial plexus tenderness worse on the right and left negative impingement the shoulders elbows reach full extension. Biceps triceps pack a radiology 2+ and symmetrical. No degenerative changes noted in the hands. No edema pulses are normal. A venous stasis changes in lower extremities she has normal heel toe gait. Cervical flexion within 2 fingerbreadths: Chest negative Lhermitte   Neurological: She is alert and oriented to person, place, and time.  Skin: Skin is warm and dry.  Psychiatric: She has a normal mood and affect. Her behavior is normal.    Ortho Exam  Specialty Comments:  No specialty comments available.  Imaging: Cervical spine x-rays on 01/08/2017 shows spondylitic changes with greater than 50% narrowing at C3-4, C4-5, C5-6 with spurring.   PMFS History: Patient Active Problem List   Diagnosis Date Noted  . Encounter for screening colonoscopy 09/08/2012  . Focal lymphocytic colitis 09/08/2012  . COLONIC POLYPS, HX OF 08/20/2010  . GERD 08/14/2010   Past Medical History:  Diagnosis Date  . Erosive esophagitis   . GERD (gastroesophageal reflux disease)   . HTN (hypertension)   .  Internal hemorrhoids 12/02/2002    Family History  Problem Relation Age of Onset  . Pneumonia Father     deceased age 41/also with h/o CVA  . CAD Mother     deceased age 87  . Colon cancer Neg Hx     Past Surgical History:  Procedure Laterality Date  . APPENDECTOMY    . BUNIONECTOMY     bilateral x5  . COLONOSCOPY  10/07/2012   RMR: Colorectal polyps-treated and/or removed as described above. Colonic diverticulosis.(serrated adenomas) next TCS 5 years  . COLONOSCOPY W/ BIOPSIES  12/08/2002   RMR: Internal hemorrhoids, otherwise normal rectum Polyp at endocecum cold biopsied/removed Abnormal appearing ileocecal valve (of doubtful clinical significance)  biopsy showed chronic active colitis with significant lymphocytic infiltrate  . ESOPHAGOGASTRODUODENOSCOPY  04/28/2007   RMR: Distal esophageal erosions consistent with mild to moderate  reflux esophagitis, otherwise, normal esophagus, small hiatal hernia; otherwise, normal stomach, first and second duodenum  . HEMORRHOID SURGERY    . KIDNEY STONE SURGERY    . SHOULDER SURGERY     left   Social History   Occupational History  . Retired [employer]  . Smoking status: Former Smoker    Packs/day: 1.00    Years: 15.00    Types: Cigarettes  . Smokeless tobacco: Never Used  . Alcohol use No  . Drug use: No  . Sexual activity: Not on file

## 2017-01-22 DIAGNOSIS — K219 Gastro-esophageal reflux disease without esophagitis: Secondary | ICD-10-CM | POA: Diagnosis not present

## 2017-01-22 DIAGNOSIS — N183 Chronic kidney disease, stage 3 (moderate): Secondary | ICD-10-CM | POA: Diagnosis not present

## 2017-01-22 DIAGNOSIS — Z79899 Other long term (current) drug therapy: Secondary | ICD-10-CM | POA: Diagnosis not present

## 2017-01-22 DIAGNOSIS — I10 Essential (primary) hypertension: Secondary | ICD-10-CM | POA: Diagnosis not present

## 2017-01-29 DIAGNOSIS — N183 Chronic kidney disease, stage 3 (moderate): Secondary | ICD-10-CM | POA: Diagnosis not present

## 2017-01-29 DIAGNOSIS — Z6829 Body mass index (BMI) 29.0-29.9, adult: Secondary | ICD-10-CM | POA: Diagnosis not present

## 2017-01-29 DIAGNOSIS — I10 Essential (primary) hypertension: Secondary | ICD-10-CM | POA: Diagnosis not present

## 2017-01-29 DIAGNOSIS — R197 Diarrhea, unspecified: Secondary | ICD-10-CM | POA: Diagnosis not present

## 2017-05-13 DIAGNOSIS — G600 Hereditary motor and sensory neuropathy: Secondary | ICD-10-CM | POA: Diagnosis not present

## 2017-05-13 DIAGNOSIS — M79671 Pain in right foot: Secondary | ICD-10-CM | POA: Diagnosis not present

## 2017-05-13 DIAGNOSIS — M9261 Juvenile osteochondrosis of tarsus, right ankle: Secondary | ICD-10-CM | POA: Diagnosis not present

## 2017-06-08 DIAGNOSIS — H40053 Ocular hypertension, bilateral: Secondary | ICD-10-CM | POA: Diagnosis not present

## 2017-06-10 DIAGNOSIS — M79671 Pain in right foot: Secondary | ICD-10-CM | POA: Diagnosis not present

## 2017-06-10 DIAGNOSIS — G600 Hereditary motor and sensory neuropathy: Secondary | ICD-10-CM | POA: Diagnosis not present

## 2017-06-10 DIAGNOSIS — M9261 Juvenile osteochondrosis of tarsus, right ankle: Secondary | ICD-10-CM | POA: Diagnosis not present

## 2017-07-03 DIAGNOSIS — Z23 Encounter for immunization: Secondary | ICD-10-CM | POA: Diagnosis not present

## 2017-07-20 ENCOUNTER — Other Ambulatory Visit (HOSPITAL_COMMUNITY): Payer: Self-pay | Admitting: Internal Medicine

## 2017-07-20 DIAGNOSIS — Z1231 Encounter for screening mammogram for malignant neoplasm of breast: Secondary | ICD-10-CM

## 2017-08-17 ENCOUNTER — Ambulatory Visit (HOSPITAL_COMMUNITY)
Admission: RE | Admit: 2017-08-17 | Discharge: 2017-08-17 | Disposition: A | Discharge status: Home / Self Care | Payer: Medicare Other | Source: Ambulatory Visit | Attending: Internal Medicine | Admitting: Internal Medicine

## 2017-08-17 ENCOUNTER — Encounter (HOSPITAL_COMMUNITY): Payer: Self-pay

## 2017-08-17 DIAGNOSIS — Z1231 Encounter for screening mammogram for malignant neoplasm of breast: Secondary | ICD-10-CM | POA: Insufficient documentation

## 2017-08-19 DIAGNOSIS — I10 Essential (primary) hypertension: Secondary | ICD-10-CM | POA: Diagnosis not present

## 2017-08-19 DIAGNOSIS — G9009 Other idiopathic peripheral autonomic neuropathy: Secondary | ICD-10-CM | POA: Diagnosis not present

## 2017-09-02 ENCOUNTER — Encounter: Payer: Self-pay | Admitting: Internal Medicine

## 2017-09-25 ENCOUNTER — Encounter: Payer: Self-pay | Admitting: Internal Medicine

## 2017-11-20 ENCOUNTER — Encounter: Payer: Self-pay | Admitting: Gastroenterology

## 2017-11-20 ENCOUNTER — Ambulatory Visit (INDEPENDENT_AMBULATORY_CARE_PROVIDER_SITE_OTHER): Payer: Medicare Other | Admitting: Gastroenterology

## 2017-11-20 VITALS — BP 135/67 | HR 69 | Temp 97.2°F | Ht 65.5 in | Wt 186.0 lb

## 2017-11-20 DIAGNOSIS — Z86010 Personal history of colonic polyps: Secondary | ICD-10-CM | POA: Diagnosis not present

## 2017-11-20 DIAGNOSIS — K219 Gastro-esophageal reflux disease without esophagitis: Secondary | ICD-10-CM | POA: Diagnosis not present

## 2017-11-20 MED ORDER — OMEPRAZOLE 20 MG PO CPDR: DELAYED_RELEASE_CAPSULE | ORAL | 11 refills | Status: DC

## 2017-11-20 NOTE — Assessment & Plan Note (Signed)
Patient not interested in pursuing further surveillance colonoscopy's. We discussed that we tend to start slowing down on surveillance colonoscopy around the age 77 although there is not any clear guidelines. She understands that there is a small risk that she developes further precancerous polyps which could be problematic down the road. She will let us know if she changes her mind. If she develops any problems such as change in bowel habits, blood in the stool, anemia she will let us know.

## 2017-11-20 NOTE — Patient Instructions (Signed)
1. Continue omeprazole once to twice daily before meals for reflux. RX sent to your pharmacy. 2. If you decide you want to pursue once last colonoscopy for history of precancerous colon polyps, let me know.  3. Return in two years or sooner if needed.

## 2017-11-20 NOTE — Progress Notes (Signed)
      Primary Care Physician: Asencion Noble, MD  Primary Gastroenterologist:  Garfield Cornea, MD   Chief Complaint  Patient presents with  . Gastroesophageal Reflux    f/u, doing ok    HPI: Tricia Jenkins is a 77 y.o. female here for follow-up of Jerrye Bushy. She was last seen in February 2018. She was due for surveillance colonoscopy for history of polyps in January 2019.  Patient states her heartburn as well controlled. She denies vomiting, dysphagia, abdominal pain. Bowel function is pretty normal for the most part. Patient will loose stools if she eats something in particular. Denies melena or rectal bleeding.  We discussed that she is due for surveillance colonoscopy for history of precancerous polyps. At this time she is not interested in pursuing any further surveillance colonoscopies.    Current Outpatient Medications  Medication Sig Dispense Refill  . amLODipine (NORVASC) 5 MG tablet Take 5 mg by mouth daily.      . cyclobenzaprine (FLEXERIL) 10 MG tablet Take 10 mg by mouth 3 (three) times daily as needed for muscle spasms.    Marland Kitchen gabapentin (NEURONTIN) 300 MG capsule Take 1 capsule by mouth 3 (three) times daily.    Marland Kitchen HYDROcodone-acetaminophen (NORCO) 10-325 MG tablet Take 1 tablet by mouth every 6 (six) hours as needed.    Marland Kitchen losartan (COZAAR) 100 MG tablet Take 100 mg by mouth daily.    . magnesium oxide (MAG-OX) 400 MG tablet Take 400 mg by mouth daily.      Marland Kitchen omeprazole (PRILOSEC) 20 MG capsule TAKE ONE CAPSULE BY MOUTH TWICE A DAY BEFORE A MEAL (Patient taking differently: TAKE ONE CAPSULE BY MOUTH TWICE A DAY BEFORE A MEAL. Takes once a day. Sometimes takes additional prn) 60 capsule 5   No current facility-administered medications for this visit.     Allergies as of 11/20/2017 - Review Complete 11/20/2017  Allergen Reaction Noted  . Naproxen    . Piroxicam      ROS:  General: Negative for anorexia, weight loss, fever, chills, fatigue, weakness. ENT: Negative for  hoarseness, difficulty swallowing , nasal congestion. CV: Negative for chest pain, angina, palpitations, dyspnea on exertion, peripheral edema.  Respiratory: Negative for dyspnea at rest, dyspnea on exertion, cough, sputum, wheezing.  GI: See history of present illness. GU:  Negative for dysuria, hematuria, urinary incontinence, urinary frequency, nocturnal urination.  Endo: Negative for unusual weight change.    Physical Examination:   BP 135/67   Pulse 69   Temp (!) 97.2 F (36.2 C) (Oral)   Ht 5' 5.5" (1.664 m)   Wt 186 lb (84.4 kg)   BMI 30.48 kg/m   General: Well-nourished, well-developed in no acute distress.  Eyes: No icterus. Mouth: Oropharyngeal mucosa moist and pink , no lesions erythema or exudate. Lungs: Clear to auscultation bilaterally.  Heart: Regular rate and rhythm, no murmurs rubs or gallops.  Abdomen: Bowel sounds are normal, nontender, nondistended, no hepatosplenomegaly or masses, no abdominal bruits or hernia , no rebound or guarding.   Extremities: No lower extremity edema. No clubbing or deformities. Neuro: Alert and oriented x 4   Skin: Warm and dry, no jaundice.   Psych: Alert and cooperative, normal mood and affect.

## 2017-11-20 NOTE — Assessment & Plan Note (Signed)
Doing very well on omeprazole 20 mg 1 to 2 times daily. Return to the office in two years

## 2017-11-20 NOTE — Progress Notes (Signed)
cc'ed to pcp °

## 2017-11-25 DIAGNOSIS — M9261 Juvenile osteochondrosis of tarsus, right ankle: Secondary | ICD-10-CM | POA: Diagnosis not present

## 2017-11-25 DIAGNOSIS — G600 Hereditary motor and sensory neuropathy: Secondary | ICD-10-CM | POA: Diagnosis not present

## 2017-11-25 DIAGNOSIS — M79671 Pain in right foot: Secondary | ICD-10-CM | POA: Diagnosis not present

## 2017-12-02 DIAGNOSIS — L570 Actinic keratosis: Secondary | ICD-10-CM | POA: Diagnosis not present

## 2017-12-02 DIAGNOSIS — Z85828 Personal history of other malignant neoplasm of skin: Secondary | ICD-10-CM | POA: Diagnosis not present

## 2017-12-02 DIAGNOSIS — L280 Lichen simplex chronicus: Secondary | ICD-10-CM | POA: Diagnosis not present

## 2017-12-30 DIAGNOSIS — M79671 Pain in right foot: Secondary | ICD-10-CM | POA: Diagnosis not present

## 2017-12-30 DIAGNOSIS — M25579 Pain in unspecified ankle and joints of unspecified foot: Secondary | ICD-10-CM | POA: Diagnosis not present

## 2017-12-30 DIAGNOSIS — M7661 Achilles tendinitis, right leg: Secondary | ICD-10-CM | POA: Diagnosis not present

## 2018-01-20 DIAGNOSIS — M79671 Pain in right foot: Secondary | ICD-10-CM | POA: Diagnosis not present

## 2018-01-20 DIAGNOSIS — M9261 Juvenile osteochondrosis of tarsus, right ankle: Secondary | ICD-10-CM | POA: Diagnosis not present

## 2018-01-20 DIAGNOSIS — G600 Hereditary motor and sensory neuropathy: Secondary | ICD-10-CM | POA: Diagnosis not present

## 2018-02-10 DIAGNOSIS — M79671 Pain in right foot: Secondary | ICD-10-CM | POA: Diagnosis not present

## 2018-02-10 DIAGNOSIS — G600 Hereditary motor and sensory neuropathy: Secondary | ICD-10-CM | POA: Diagnosis not present

## 2018-02-10 DIAGNOSIS — M9261 Juvenile osteochondrosis of tarsus, right ankle: Secondary | ICD-10-CM | POA: Diagnosis not present

## 2018-02-23 DIAGNOSIS — M7661 Achilles tendinitis, right leg: Secondary | ICD-10-CM | POA: Diagnosis not present

## 2018-02-23 DIAGNOSIS — R600 Localized edema: Secondary | ICD-10-CM | POA: Diagnosis not present

## 2018-03-02 DIAGNOSIS — E785 Hyperlipidemia, unspecified: Secondary | ICD-10-CM | POA: Diagnosis not present

## 2018-03-02 DIAGNOSIS — K219 Gastro-esophageal reflux disease without esophagitis: Secondary | ICD-10-CM | POA: Diagnosis not present

## 2018-03-02 DIAGNOSIS — I10 Essential (primary) hypertension: Secondary | ICD-10-CM | POA: Diagnosis not present

## 2018-03-02 DIAGNOSIS — N183 Chronic kidney disease, stage 3 (moderate): Secondary | ICD-10-CM | POA: Diagnosis not present

## 2018-03-02 DIAGNOSIS — Z79899 Other long term (current) drug therapy: Secondary | ICD-10-CM | POA: Diagnosis not present

## 2018-03-03 DIAGNOSIS — M7661 Achilles tendinitis, right leg: Secondary | ICD-10-CM | POA: Diagnosis not present

## 2018-03-03 DIAGNOSIS — M9261 Juvenile osteochondrosis of tarsus, right ankle: Secondary | ICD-10-CM | POA: Diagnosis not present

## 2018-03-03 DIAGNOSIS — M79671 Pain in right foot: Secondary | ICD-10-CM | POA: Diagnosis not present

## 2018-03-03 DIAGNOSIS — E875 Hyperkalemia: Secondary | ICD-10-CM | POA: Diagnosis not present

## 2018-03-09 DIAGNOSIS — N183 Chronic kidney disease, stage 3 (moderate): Secondary | ICD-10-CM | POA: Diagnosis not present

## 2018-03-09 DIAGNOSIS — E876 Hypokalemia: Secondary | ICD-10-CM | POA: Diagnosis not present

## 2018-03-09 DIAGNOSIS — Z6830 Body mass index (BMI) 30.0-30.9, adult: Secondary | ICD-10-CM | POA: Diagnosis not present

## 2018-03-09 DIAGNOSIS — I10 Essential (primary) hypertension: Secondary | ICD-10-CM | POA: Diagnosis not present

## 2018-03-09 DIAGNOSIS — G9009 Other idiopathic peripheral autonomic neuropathy: Secondary | ICD-10-CM | POA: Diagnosis not present

## 2018-03-31 DIAGNOSIS — M9261 Juvenile osteochondrosis of tarsus, right ankle: Secondary | ICD-10-CM | POA: Diagnosis not present

## 2018-03-31 DIAGNOSIS — G600 Hereditary motor and sensory neuropathy: Secondary | ICD-10-CM | POA: Diagnosis not present

## 2018-03-31 DIAGNOSIS — M79671 Pain in right foot: Secondary | ICD-10-CM | POA: Diagnosis not present

## 2018-04-06 DIAGNOSIS — E875 Hyperkalemia: Secondary | ICD-10-CM | POA: Diagnosis not present

## 2018-04-19 DIAGNOSIS — R05 Cough: Secondary | ICD-10-CM | POA: Diagnosis not present

## 2018-04-19 DIAGNOSIS — H10011 Acute follicular conjunctivitis, right eye: Secondary | ICD-10-CM | POA: Diagnosis not present

## 2018-06-30 DIAGNOSIS — Z23 Encounter for immunization: Secondary | ICD-10-CM | POA: Diagnosis not present

## 2018-07-07 DIAGNOSIS — M79671 Pain in right foot: Secondary | ICD-10-CM | POA: Diagnosis not present

## 2018-07-07 DIAGNOSIS — M9261 Juvenile osteochondrosis of tarsus, right ankle: Secondary | ICD-10-CM | POA: Diagnosis not present

## 2018-07-07 DIAGNOSIS — G600 Hereditary motor and sensory neuropathy: Secondary | ICD-10-CM | POA: Diagnosis not present

## 2018-08-02 ENCOUNTER — Other Ambulatory Visit (HOSPITAL_COMMUNITY): Payer: Self-pay | Admitting: Internal Medicine

## 2018-08-02 DIAGNOSIS — Z1231 Encounter for screening mammogram for malignant neoplasm of breast: Secondary | ICD-10-CM

## 2018-08-25 ENCOUNTER — Ambulatory Visit (HOSPITAL_COMMUNITY)
Admission: RE | Admit: 2018-08-25 | Discharge: 2018-08-25 | Disposition: A | Discharge status: Home / Self Care | Payer: Medicare Other | Source: Ambulatory Visit | Attending: Internal Medicine | Admitting: Internal Medicine

## 2018-08-25 ENCOUNTER — Encounter (HOSPITAL_COMMUNITY): Payer: Self-pay

## 2018-08-25 DIAGNOSIS — Z1231 Encounter for screening mammogram for malignant neoplasm of breast: Secondary | ICD-10-CM | POA: Insufficient documentation

## 2018-09-10 DIAGNOSIS — I10 Essential (primary) hypertension: Secondary | ICD-10-CM | POA: Diagnosis not present

## 2018-09-10 DIAGNOSIS — E875 Hyperkalemia: Secondary | ICD-10-CM | POA: Diagnosis not present

## 2018-11-04 DIAGNOSIS — R05 Cough: Secondary | ICD-10-CM | POA: Diagnosis not present

## 2018-11-12 DIAGNOSIS — M25562 Pain in left knee: Secondary | ICD-10-CM | POA: Diagnosis not present

## 2018-11-12 DIAGNOSIS — M25552 Pain in left hip: Secondary | ICD-10-CM | POA: Diagnosis not present

## 2018-11-12 DIAGNOSIS — Z79899 Other long term (current) drug therapy: Secondary | ICD-10-CM | POA: Diagnosis not present

## 2018-11-12 DIAGNOSIS — Z87891 Personal history of nicotine dependence: Secondary | ICD-10-CM | POA: Diagnosis not present

## 2018-11-12 DIAGNOSIS — I10 Essential (primary) hypertension: Secondary | ICD-10-CM | POA: Diagnosis not present

## 2018-11-12 DIAGNOSIS — M25462 Effusion, left knee: Secondary | ICD-10-CM | POA: Diagnosis not present

## 2018-11-16 DIAGNOSIS — S83242D Other tear of medial meniscus, current injury, left knee, subsequent encounter: Secondary | ICD-10-CM | POA: Diagnosis not present

## 2018-12-06 DIAGNOSIS — Z85828 Personal history of other malignant neoplasm of skin: Secondary | ICD-10-CM | POA: Diagnosis not present

## 2018-12-06 DIAGNOSIS — L570 Actinic keratosis: Secondary | ICD-10-CM | POA: Diagnosis not present

## 2018-12-06 DIAGNOSIS — D1801 Hemangioma of skin and subcutaneous tissue: Secondary | ICD-10-CM | POA: Diagnosis not present

## 2018-12-07 DIAGNOSIS — S83242D Other tear of medial meniscus, current injury, left knee, subsequent encounter: Secondary | ICD-10-CM | POA: Diagnosis not present

## 2018-12-22 DIAGNOSIS — M79671 Pain in right foot: Secondary | ICD-10-CM | POA: Diagnosis not present

## 2018-12-22 DIAGNOSIS — M79672 Pain in left foot: Secondary | ICD-10-CM | POA: Diagnosis not present

## 2018-12-22 DIAGNOSIS — I739 Peripheral vascular disease, unspecified: Secondary | ICD-10-CM | POA: Diagnosis not present

## 2018-12-22 DIAGNOSIS — G600 Hereditary motor and sensory neuropathy: Secondary | ICD-10-CM | POA: Diagnosis not present

## 2019-02-08 DIAGNOSIS — S83242D Other tear of medial meniscus, current injury, left knee, subsequent encounter: Secondary | ICD-10-CM | POA: Diagnosis not present

## 2019-02-15 DIAGNOSIS — S83242D Other tear of medial meniscus, current injury, left knee, subsequent encounter: Secondary | ICD-10-CM | POA: Diagnosis not present

## 2019-02-15 DIAGNOSIS — S83282A Other tear of lateral meniscus, current injury, left knee, initial encounter: Secondary | ICD-10-CM | POA: Diagnosis not present

## 2019-02-15 DIAGNOSIS — M25562 Pain in left knee: Secondary | ICD-10-CM | POA: Diagnosis not present

## 2019-02-15 DIAGNOSIS — M25462 Effusion, left knee: Secondary | ICD-10-CM | POA: Diagnosis not present

## 2019-02-15 DIAGNOSIS — M23352 Other meniscus derangements, posterior horn of lateral meniscus, left knee: Secondary | ICD-10-CM | POA: Diagnosis not present

## 2019-02-15 DIAGNOSIS — M7122 Synovial cyst of popliteal space [Baker], left knee: Secondary | ICD-10-CM | POA: Diagnosis not present

## 2019-02-24 DIAGNOSIS — M7122 Synovial cyst of popliteal space [Baker], left knee: Secondary | ICD-10-CM | POA: Diagnosis not present

## 2019-03-22 DIAGNOSIS — Z79899 Other long term (current) drug therapy: Secondary | ICD-10-CM | POA: Diagnosis not present

## 2019-03-22 DIAGNOSIS — I10 Essential (primary) hypertension: Secondary | ICD-10-CM | POA: Diagnosis not present

## 2019-03-22 DIAGNOSIS — E875 Hyperkalemia: Secondary | ICD-10-CM | POA: Diagnosis not present

## 2019-03-29 DIAGNOSIS — N183 Chronic kidney disease, stage 3 (moderate): Secondary | ICD-10-CM | POA: Diagnosis not present

## 2019-03-29 DIAGNOSIS — G9009 Other idiopathic peripheral autonomic neuropathy: Secondary | ICD-10-CM | POA: Diagnosis not present

## 2019-03-29 DIAGNOSIS — I10 Essential (primary) hypertension: Secondary | ICD-10-CM | POA: Diagnosis not present

## 2019-03-30 ENCOUNTER — Other Ambulatory Visit (HOSPITAL_COMMUNITY): Payer: Self-pay | Admitting: Internal Medicine

## 2019-03-30 DIAGNOSIS — Z780 Asymptomatic menopausal state: Secondary | ICD-10-CM

## 2019-04-18 ENCOUNTER — Other Ambulatory Visit (HOSPITAL_COMMUNITY): Payer: 59

## 2019-04-19 ENCOUNTER — Other Ambulatory Visit: Payer: Self-pay

## 2019-04-19 ENCOUNTER — Ambulatory Visit (HOSPITAL_COMMUNITY)
Admission: RE | Admit: 2019-04-19 | Discharge: 2019-04-19 | Disposition: A | Discharge status: Home / Self Care | Payer: Medicare Other | Source: Ambulatory Visit | Attending: Internal Medicine | Admitting: Internal Medicine

## 2019-04-19 DIAGNOSIS — M85852 Other specified disorders of bone density and structure, left thigh: Secondary | ICD-10-CM | POA: Diagnosis not present

## 2019-04-19 DIAGNOSIS — Z780 Asymptomatic menopausal state: Secondary | ICD-10-CM | POA: Diagnosis not present

## 2019-04-26 DIAGNOSIS — H40053 Ocular hypertension, bilateral: Secondary | ICD-10-CM | POA: Diagnosis not present

## 2019-05-25 DIAGNOSIS — M79671 Pain in right foot: Secondary | ICD-10-CM | POA: Diagnosis not present

## 2019-05-25 DIAGNOSIS — M25579 Pain in unspecified ankle and joints of unspecified foot: Secondary | ICD-10-CM | POA: Diagnosis not present

## 2019-05-25 DIAGNOSIS — M79672 Pain in left foot: Secondary | ICD-10-CM | POA: Diagnosis not present

## 2019-07-07 DIAGNOSIS — Z23 Encounter for immunization: Secondary | ICD-10-CM | POA: Diagnosis not present

## 2019-08-17 ENCOUNTER — Other Ambulatory Visit: Payer: Self-pay

## 2019-08-17 DIAGNOSIS — Z20822 Contact with and (suspected) exposure to covid-19: Secondary | ICD-10-CM

## 2019-08-17 DIAGNOSIS — Z20828 Contact with and (suspected) exposure to other viral communicable diseases: Secondary | ICD-10-CM | POA: Diagnosis not present

## 2019-08-19 LAB — NOVEL CORONAVIRUS, NAA: SARS-CoV-2, NAA: NOT DETECTED

## 2019-08-23 ENCOUNTER — Telehealth: Payer: Self-pay

## 2019-08-23 ENCOUNTER — Other Ambulatory Visit (HOSPITAL_COMMUNITY): Payer: Self-pay | Admitting: Internal Medicine

## 2019-08-23 DIAGNOSIS — Z1231 Encounter for screening mammogram for malignant neoplasm of breast: Secondary | ICD-10-CM

## 2019-08-23 NOTE — Telephone Encounter (Signed)
Pt. Given COVID 19 results, verbalizes understanding. 

## 2019-08-31 ENCOUNTER — Ambulatory Visit (HOSPITAL_COMMUNITY)
Admission: RE | Admit: 2019-08-31 | Discharge: 2019-08-31 | Disposition: A | Discharge status: Home / Self Care | Payer: Medicare Other | Source: Ambulatory Visit | Attending: Internal Medicine | Admitting: Internal Medicine

## 2019-08-31 ENCOUNTER — Other Ambulatory Visit (HOSPITAL_COMMUNITY): Payer: Self-pay | Admitting: Internal Medicine

## 2019-08-31 ENCOUNTER — Other Ambulatory Visit: Payer: Self-pay

## 2019-08-31 DIAGNOSIS — R928 Other abnormal and inconclusive findings on diagnostic imaging of breast: Secondary | ICD-10-CM

## 2019-08-31 DIAGNOSIS — Z1231 Encounter for screening mammogram for malignant neoplasm of breast: Secondary | ICD-10-CM | POA: Insufficient documentation

## 2019-09-06 ENCOUNTER — Ambulatory Visit (HOSPITAL_COMMUNITY): Payer: Medicare Other

## 2019-09-06 ENCOUNTER — Ambulatory Visit (HOSPITAL_COMMUNITY)
Admission: RE | Admit: 2019-09-06 | Discharge: 2019-09-06 | Disposition: A | Discharge status: Home / Self Care | Payer: Medicare Other | Source: Ambulatory Visit | Attending: Internal Medicine | Admitting: Internal Medicine

## 2019-09-06 ENCOUNTER — Other Ambulatory Visit: Payer: Self-pay

## 2019-09-06 DIAGNOSIS — R928 Other abnormal and inconclusive findings on diagnostic imaging of breast: Secondary | ICD-10-CM | POA: Insufficient documentation

## 2019-11-22 DIAGNOSIS — M79672 Pain in left foot: Secondary | ICD-10-CM | POA: Diagnosis not present

## 2019-11-22 DIAGNOSIS — M25579 Pain in unspecified ankle and joints of unspecified foot: Secondary | ICD-10-CM | POA: Diagnosis not present

## 2019-11-22 DIAGNOSIS — M79671 Pain in right foot: Secondary | ICD-10-CM | POA: Diagnosis not present

## 2019-12-06 DIAGNOSIS — L570 Actinic keratosis: Secondary | ICD-10-CM | POA: Diagnosis not present

## 2019-12-06 DIAGNOSIS — L819 Disorder of pigmentation, unspecified: Secondary | ICD-10-CM | POA: Diagnosis not present

## 2019-12-06 DIAGNOSIS — Z85828 Personal history of other malignant neoplasm of skin: Secondary | ICD-10-CM | POA: Diagnosis not present

## 2020-04-06 DIAGNOSIS — M7662 Achilles tendinitis, left leg: Secondary | ICD-10-CM | POA: Diagnosis not present

## 2020-04-10 DIAGNOSIS — M7662 Achilles tendinitis, left leg: Secondary | ICD-10-CM | POA: Diagnosis not present

## 2020-04-12 DIAGNOSIS — M7662 Achilles tendinitis, left leg: Secondary | ICD-10-CM | POA: Diagnosis not present

## 2020-04-16 DIAGNOSIS — M7662 Achilles tendinitis, left leg: Secondary | ICD-10-CM | POA: Diagnosis not present

## 2020-04-18 DIAGNOSIS — M7662 Achilles tendinitis, left leg: Secondary | ICD-10-CM | POA: Diagnosis not present

## 2020-04-24 DIAGNOSIS — M25579 Pain in unspecified ankle and joints of unspecified foot: Secondary | ICD-10-CM | POA: Diagnosis not present

## 2020-04-24 DIAGNOSIS — M79671 Pain in right foot: Secondary | ICD-10-CM | POA: Diagnosis not present

## 2020-04-24 DIAGNOSIS — M79672 Pain in left foot: Secondary | ICD-10-CM | POA: Diagnosis not present

## 2020-05-03 DIAGNOSIS — K921 Melena: Secondary | ICD-10-CM | POA: Diagnosis not present

## 2020-05-03 DIAGNOSIS — R195 Other fecal abnormalities: Secondary | ICD-10-CM | POA: Diagnosis not present

## 2020-05-04 DIAGNOSIS — K921 Melena: Secondary | ICD-10-CM | POA: Diagnosis not present

## 2020-05-29 DIAGNOSIS — E875 Hyperkalemia: Secondary | ICD-10-CM | POA: Diagnosis not present

## 2020-05-29 DIAGNOSIS — N183 Chronic kidney disease, stage 3 unspecified: Secondary | ICD-10-CM | POA: Diagnosis not present

## 2020-05-29 DIAGNOSIS — I10 Essential (primary) hypertension: Secondary | ICD-10-CM | POA: Diagnosis not present

## 2020-06-04 DIAGNOSIS — N1831 Chronic kidney disease, stage 3a: Secondary | ICD-10-CM | POA: Diagnosis not present

## 2020-06-04 DIAGNOSIS — I10 Essential (primary) hypertension: Secondary | ICD-10-CM | POA: Diagnosis not present

## 2020-06-04 DIAGNOSIS — E875 Hyperkalemia: Secondary | ICD-10-CM | POA: Diagnosis not present

## 2020-07-05 DIAGNOSIS — H40013 Open angle with borderline findings, low risk, bilateral: Secondary | ICD-10-CM | POA: Diagnosis not present

## 2020-07-13 DIAGNOSIS — Z23 Encounter for immunization: Secondary | ICD-10-CM | POA: Diagnosis not present

## 2020-07-23 DIAGNOSIS — Z23 Encounter for immunization: Secondary | ICD-10-CM | POA: Diagnosis not present

## 2020-07-23 DIAGNOSIS — M7601 Gluteal tendinitis, right hip: Secondary | ICD-10-CM | POA: Diagnosis not present

## 2020-08-14 DIAGNOSIS — M79672 Pain in left foot: Secondary | ICD-10-CM | POA: Diagnosis not present

## 2020-08-14 DIAGNOSIS — M79671 Pain in right foot: Secondary | ICD-10-CM | POA: Diagnosis not present

## 2020-08-15 ENCOUNTER — Other Ambulatory Visit (HOSPITAL_COMMUNITY): Payer: Self-pay | Admitting: Internal Medicine

## 2020-08-15 DIAGNOSIS — Z1231 Encounter for screening mammogram for malignant neoplasm of breast: Secondary | ICD-10-CM

## 2020-08-28 ENCOUNTER — Emergency Department (HOSPITAL_COMMUNITY): Payer: No Typology Code available for payment source

## 2020-08-28 ENCOUNTER — Other Ambulatory Visit: Payer: Self-pay

## 2020-08-28 ENCOUNTER — Emergency Department (HOSPITAL_COMMUNITY)
Admission: EM | Admit: 2020-08-28 | Discharge: 2020-08-28 | Disposition: A | Discharge status: Home / Self Care | Payer: No Typology Code available for payment source | Attending: Emergency Medicine | Admitting: Emergency Medicine

## 2020-08-28 ENCOUNTER — Encounter (HOSPITAL_COMMUNITY): Payer: Self-pay

## 2020-08-28 DIAGNOSIS — Z87891 Personal history of nicotine dependence: Secondary | ICD-10-CM | POA: Diagnosis not present

## 2020-08-28 DIAGNOSIS — Y92410 Unspecified street and highway as the place of occurrence of the external cause: Secondary | ICD-10-CM | POA: Diagnosis not present

## 2020-08-28 DIAGNOSIS — Z23 Encounter for immunization: Secondary | ICD-10-CM | POA: Diagnosis not present

## 2020-08-28 DIAGNOSIS — Z79899 Other long term (current) drug therapy: Secondary | ICD-10-CM | POA: Diagnosis not present

## 2020-08-28 DIAGNOSIS — S6991XA Unspecified injury of right wrist, hand and finger(s), initial encounter: Secondary | ICD-10-CM | POA: Diagnosis present

## 2020-08-28 DIAGNOSIS — M25531 Pain in right wrist: Secondary | ICD-10-CM | POA: Diagnosis not present

## 2020-08-28 DIAGNOSIS — Z86010 Personal history of colonic polyps: Secondary | ICD-10-CM | POA: Diagnosis not present

## 2020-08-28 DIAGNOSIS — I10 Essential (primary) hypertension: Secondary | ICD-10-CM | POA: Insufficient documentation

## 2020-08-28 DIAGNOSIS — R079 Chest pain, unspecified: Secondary | ICD-10-CM | POA: Diagnosis not present

## 2020-08-28 DIAGNOSIS — R0789 Other chest pain: Secondary | ICD-10-CM | POA: Diagnosis not present

## 2020-08-28 DIAGNOSIS — R0689 Other abnormalities of breathing: Secondary | ICD-10-CM | POA: Diagnosis not present

## 2020-08-28 MED ORDER — HYDROCODONE-ACETAMINOPHEN 5-325 MG PO TABS: 1.0000 | ORAL_TABLET | Freq: Four times a day (QID) | ORAL | 0 refills | Status: DC | PRN

## 2020-08-28 MED ORDER — TETANUS-DIPHTH-ACELL PERTUSSIS 5-2.5-18.5 LF-MCG/0.5 IM SUSY
0.5000 mL | PREFILLED_SYRINGE | Freq: Once | INTRAMUSCULAR | Status: AC
  Administered 2020-08-28: 0.5 mL via INTRAMUSCULAR
  Filled 2020-08-28: qty 0.5
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
## 2020-08-28 NOTE — ED Notes (Signed)
Pt educated on follow up and dc ambulating to front lobby unassisted.

## 2020-08-28 NOTE — Discharge Instructions (Addendum)
Clean your small cut on your hand with soap and water couple times a day and try Tylenol for pain if that does not help you can use the Vicodin for pain.  Follow-up with your PCP if not improving

## 2020-09-03 DIAGNOSIS — I10 Essential (primary) hypertension: Secondary | ICD-10-CM | POA: Diagnosis not present

## 2020-09-03 DIAGNOSIS — R197 Diarrhea, unspecified: Secondary | ICD-10-CM | POA: Diagnosis not present

## 2020-09-03 DIAGNOSIS — R079 Chest pain, unspecified: Secondary | ICD-10-CM | POA: Diagnosis not present

## 2020-09-12 ENCOUNTER — Ambulatory Visit (HOSPITAL_COMMUNITY)
Admission: RE | Admit: 2020-09-12 | Discharge: 2020-09-12 | Disposition: A | Discharge status: Home / Self Care | Payer: Medicare Other | Source: Ambulatory Visit | Attending: Internal Medicine | Admitting: Internal Medicine

## 2020-09-12 ENCOUNTER — Other Ambulatory Visit: Payer: Self-pay

## 2020-09-12 DIAGNOSIS — Z1231 Encounter for screening mammogram for malignant neoplasm of breast: Secondary | ICD-10-CM | POA: Insufficient documentation

## 2020-11-07 DIAGNOSIS — M79674 Pain in right toe(s): Secondary | ICD-10-CM | POA: Diagnosis not present

## 2020-11-07 DIAGNOSIS — M79672 Pain in left foot: Secondary | ICD-10-CM | POA: Diagnosis not present

## 2020-11-07 DIAGNOSIS — M79675 Pain in left toe(s): Secondary | ICD-10-CM | POA: Diagnosis not present

## 2020-11-07 DIAGNOSIS — M79671 Pain in right foot: Secondary | ICD-10-CM | POA: Diagnosis not present

## 2020-11-07 DIAGNOSIS — I739 Peripheral vascular disease, unspecified: Secondary | ICD-10-CM | POA: Diagnosis not present

## 2020-11-07 DIAGNOSIS — G600 Hereditary motor and sensory neuropathy: Secondary | ICD-10-CM | POA: Diagnosis not present

## 2020-11-27 DIAGNOSIS — M25551 Pain in right hip: Secondary | ICD-10-CM | POA: Diagnosis not present

## 2020-11-27 DIAGNOSIS — M4726 Other spondylosis with radiculopathy, lumbar region: Secondary | ICD-10-CM | POA: Diagnosis not present

## 2020-12-03 DIAGNOSIS — R197 Diarrhea, unspecified: Secondary | ICD-10-CM | POA: Diagnosis not present

## 2020-12-03 DIAGNOSIS — I10 Essential (primary) hypertension: Secondary | ICD-10-CM | POA: Diagnosis not present

## 2020-12-05 DIAGNOSIS — Z1283 Encounter for screening for malignant neoplasm of skin: Secondary | ICD-10-CM | POA: Diagnosis not present

## 2020-12-05 DIAGNOSIS — L570 Actinic keratosis: Secondary | ICD-10-CM | POA: Diagnosis not present

## 2020-12-05 DIAGNOSIS — L821 Other seborrheic keratosis: Secondary | ICD-10-CM | POA: Diagnosis not present

## 2020-12-05 DIAGNOSIS — Z85828 Personal history of other malignant neoplasm of skin: Secondary | ICD-10-CM | POA: Diagnosis not present

## 2020-12-19 DIAGNOSIS — K219 Gastro-esophageal reflux disease without esophagitis: Secondary | ICD-10-CM | POA: Diagnosis not present

## 2020-12-19 DIAGNOSIS — I10 Essential (primary) hypertension: Secondary | ICD-10-CM | POA: Diagnosis not present

## 2021-01-01 DIAGNOSIS — H40013 Open angle with borderline findings, low risk, bilateral: Secondary | ICD-10-CM | POA: Diagnosis not present

## 2021-01-19 DIAGNOSIS — K219 Gastro-esophageal reflux disease without esophagitis: Secondary | ICD-10-CM | POA: Diagnosis not present

## 2021-01-19 DIAGNOSIS — I10 Essential (primary) hypertension: Secondary | ICD-10-CM | POA: Diagnosis not present

## 2021-02-19 DIAGNOSIS — I10 Essential (primary) hypertension: Secondary | ICD-10-CM | POA: Diagnosis not present

## 2021-02-19 DIAGNOSIS — K219 Gastro-esophageal reflux disease without esophagitis: Secondary | ICD-10-CM | POA: Diagnosis not present

## 2021-02-20 DIAGNOSIS — M5416 Radiculopathy, lumbar region: Secondary | ICD-10-CM | POA: Diagnosis not present

## 2021-03-11 DIAGNOSIS — Z1152 Encounter for screening for COVID-19: Secondary | ICD-10-CM | POA: Diagnosis not present

## 2021-03-21 DIAGNOSIS — K219 Gastro-esophageal reflux disease without esophagitis: Secondary | ICD-10-CM | POA: Diagnosis not present

## 2021-03-21 DIAGNOSIS — I10 Essential (primary) hypertension: Secondary | ICD-10-CM | POA: Diagnosis not present

## 2021-03-28 DIAGNOSIS — I10 Essential (primary) hypertension: Secondary | ICD-10-CM | POA: Diagnosis not present

## 2021-03-28 DIAGNOSIS — Z79899 Other long term (current) drug therapy: Secondary | ICD-10-CM | POA: Diagnosis not present

## 2021-04-04 DIAGNOSIS — N1831 Chronic kidney disease, stage 3a: Secondary | ICD-10-CM | POA: Diagnosis not present

## 2021-04-04 DIAGNOSIS — G629 Polyneuropathy, unspecified: Secondary | ICD-10-CM | POA: Diagnosis not present

## 2021-04-04 DIAGNOSIS — R001 Bradycardia, unspecified: Secondary | ICD-10-CM | POA: Diagnosis not present

## 2021-04-04 DIAGNOSIS — I10 Essential (primary) hypertension: Secondary | ICD-10-CM | POA: Diagnosis not present

## 2021-04-04 DIAGNOSIS — Z6830 Body mass index (BMI) 30.0-30.9, adult: Secondary | ICD-10-CM | POA: Diagnosis not present

## 2021-04-09 DIAGNOSIS — Z23 Encounter for immunization: Secondary | ICD-10-CM | POA: Diagnosis not present

## 2021-04-21 DIAGNOSIS — I10 Essential (primary) hypertension: Secondary | ICD-10-CM | POA: Diagnosis not present

## 2021-04-21 DIAGNOSIS — K219 Gastro-esophageal reflux disease without esophagitis: Secondary | ICD-10-CM | POA: Diagnosis not present

## 2021-04-24 DIAGNOSIS — G600 Hereditary motor and sensory neuropathy: Secondary | ICD-10-CM | POA: Diagnosis not present

## 2021-04-24 DIAGNOSIS — M79671 Pain in right foot: Secondary | ICD-10-CM | POA: Diagnosis not present

## 2021-04-24 DIAGNOSIS — I739 Peripheral vascular disease, unspecified: Secondary | ICD-10-CM | POA: Diagnosis not present

## 2021-04-24 DIAGNOSIS — M79672 Pain in left foot: Secondary | ICD-10-CM | POA: Diagnosis not present

## 2021-05-13 DIAGNOSIS — Z1152 Encounter for screening for COVID-19: Secondary | ICD-10-CM | POA: Diagnosis not present

## 2021-05-22 DIAGNOSIS — I10 Essential (primary) hypertension: Secondary | ICD-10-CM | POA: Diagnosis not present

## 2021-05-22 DIAGNOSIS — E785 Hyperlipidemia, unspecified: Secondary | ICD-10-CM | POA: Diagnosis not present

## 2021-06-11 DIAGNOSIS — Z1152 Encounter for screening for COVID-19: Secondary | ICD-10-CM | POA: Diagnosis not present

## 2021-06-21 DIAGNOSIS — K219 Gastro-esophageal reflux disease without esophagitis: Secondary | ICD-10-CM | POA: Diagnosis not present

## 2021-06-21 DIAGNOSIS — I10 Essential (primary) hypertension: Secondary | ICD-10-CM | POA: Diagnosis not present

## 2021-07-01 DIAGNOSIS — Z23 Encounter for immunization: Secondary | ICD-10-CM | POA: Diagnosis not present

## 2021-07-08 DIAGNOSIS — H43813 Vitreous degeneration, bilateral: Secondary | ICD-10-CM | POA: Diagnosis not present

## 2021-07-12 DIAGNOSIS — Z1152 Encounter for screening for COVID-19: Secondary | ICD-10-CM | POA: Diagnosis not present

## 2021-07-15 ENCOUNTER — Other Ambulatory Visit: Payer: Self-pay

## 2021-07-15 ENCOUNTER — Ambulatory Visit
Admission: EM | Admit: 2021-07-15 | Discharge: 2021-07-15 | Disposition: A | Discharge status: Home / Self Care | Payer: Medicare Other | Attending: Urgent Care | Admitting: Urgent Care

## 2021-07-15 ENCOUNTER — Encounter: Payer: Self-pay | Admitting: Emergency Medicine

## 2021-07-15 DIAGNOSIS — K2100 Gastro-esophageal reflux disease with esophagitis, without bleeding: Secondary | ICD-10-CM

## 2021-07-15 DIAGNOSIS — K0889 Other specified disorders of teeth and supporting structures: Secondary | ICD-10-CM | POA: Diagnosis not present

## 2021-07-15 DIAGNOSIS — K047 Periapical abscess without sinus: Secondary | ICD-10-CM | POA: Diagnosis not present

## 2021-07-15 DIAGNOSIS — R519 Headache, unspecified: Secondary | ICD-10-CM

## 2021-07-15 DIAGNOSIS — K2210 Ulcer of esophagus without bleeding: Secondary | ICD-10-CM

## 2021-07-15 DIAGNOSIS — R1310 Dysphagia, unspecified: Secondary | ICD-10-CM

## 2021-07-15 DIAGNOSIS — K208 Other esophagitis without bleeding: Secondary | ICD-10-CM | POA: Diagnosis not present

## 2021-07-15 DIAGNOSIS — K219 Gastro-esophageal reflux disease without esophagitis: Secondary | ICD-10-CM

## 2021-07-15 DIAGNOSIS — R130 Aphagia: Secondary | ICD-10-CM | POA: Diagnosis not present

## 2021-07-15 MED ORDER — AMOXICILLIN-POT CLAVULANATE 875-125 MG PO TABS: 1.0000 | ORAL_TABLET | Freq: Two times a day (BID) | ORAL | 0 refills | Status: DC

## 2021-07-15 NOTE — Discharge Instructions (Signed)
Make an appointment with your gastroenterologist for a recheck on your difficulty swallowing with your history of acid reflux and erosive esophagitis. Keep taking your medications for that. Make sure you schedule an appointment with a dentist/dental surgeon as soon as possible.  You may try some of the resources below.     [facility] [phone] extension [phone]  Dr. Donn Pierini [phone] [place] [phone] [address] [facility] [place].  [facility] [phone] extension [phone], [place], [place], [address] First come first serve for the first 10 clients.  May do simple extractions only, no wisdom teeth or surgery.  You may try the second for Thursday of the month starting at [employer] You may call the school to see if they are still helping to provide dental care for emergent cases.

## 2021-07-15 NOTE — ED Triage Notes (Signed)
Pt is present today with left side facial pain and has complaints of trouble swallowing. Pt states pain started last around 12am. Pt denies any injury. Pt denies any difficulties breathing

## 2021-07-15 NOTE — ED Provider Notes (Signed)
[place]   MRN: [id number] DOB: October 19, 1940  Subjective:   Tricia Jenkins is a 80 y.o. female presenting for 1 day history of acute onset left-sided dental pain, facial pain, bilateral neck pain.  She has also noticed that she has had difficulty swallowing but is not painful swallowing.  Denies fever, oral or facial swelling.  She has dentures on the upper side but not the lower.  Has not had work done to the lower teeth.  Does not have a dentist she sees regularly.  She also has a history of acid reflux, erosive esophagitis but is not taking anything for this.  No chest pain, shortness of breath, wheezing, tongue swelling, throat closing sensations.  No new exposures, new medications.  No current facility-administered medications for this encounter.  Current Outpatient Medications:    amLODipine (NORVASC) 5 MG tablet, Take 5 mg by mouth daily.  , Disp: , Rfl:    cyclobenzaprine (FLEXERIL) 10 MG tablet, Take 10 mg by mouth 3 (three) times daily as needed for muscle spasms., Disp: , Rfl:    gabapentin (NEURONTIN) 300 MG capsule, Take 1 capsule by mouth 3 (three) times daily., Disp: , Rfl:    HYDROcodone-acetaminophen (NORCO/VICODIN) 5-325 MG tablet, Take 1 tablet by mouth every 6 (six) hours as needed for moderate pain., Disp: 20 tablet, Rfl: 0   losartan (COZAAR) 100 MG tablet, Take 100 mg by mouth daily., Disp: , Rfl:    magnesium oxide (MAG-OX) 400 MG tablet, Take 400 mg by mouth daily.  , Disp: , Rfl:    omeprazole (PRILOSEC) 20 MG capsule, Take one capsule once to twice daily before meals for acid reflux/heartburn., Disp: 60 capsule, Rfl: 11   Allergies  Allergen Reactions   Naproxen    Piroxicam     Past Medical History:  Diagnosis Date   Erosive esophagitis    GERD (gastroesophageal reflux disease)    HTN (hypertension)    Internal hemorrhoids 12/02/2002     Past Surgical History:  Procedure Laterality Date   APPENDECTOMY     BUNIONECTOMY     bilateral x5    COLONOSCOPY  10/07/2012   RMR: Colorectal polyps-treated and/or removed as described above. Colonic diverticulosis.(serrated adenomas) next TCS 5 years   COLONOSCOPY W/ BIOPSIES  12/08/2002   RMR: Internal hemorrhoids, otherwise normal rectum Polyp at endocecum cold biopsied/removed Abnormal appearing ileocecal valve (of doubtful clinical significance)  biopsy showed chronic active colitis with significant lymphocytic infiltrate   ESOPHAGOGASTRODUODENOSCOPY  04/28/2007   RMR: Distal esophageal erosions consistent with mild to moderate  reflux esophagitis, otherwise, normal esophagus, small hiatal hernia; otherwise, normal stomach, first and second duodenum   HEMORRHOID SURGERY     KIDNEY STONE SURGERY     SHOULDER SURGERY     left    Family History  Problem Relation Age of Onset   CAD Mother        deceased age 12   Pneumonia Father        deceased age 21/also with h/o CVA   Colon cancer Neg Hx     Social History   Tobacco Use   Smoking status: Former    Packs/day: 1.00    Years: 15.00    Pack years: 15.00    Types: Cigarettes   Smokeless tobacco: Never  Substance Use Topics   Alcohol use: No   Drug use: No    ROS   Objective:   Vitals: BP (!) 148/79   Pulse 89   Temp  97.9 F (36.6 C)   Resp 17   SpO2 93%   Physical Exam Constitutional:      General: She is not in acute distress.    Appearance: Normal appearance. She is well-developed. She is not ill-appearing, toxic-appearing or diaphoretic.  HENT:     Head: Normocephalic and atraumatic.     Comments: Exquisite facial tenderness over the left lower side.    Right Ear: Tympanic membrane and ear canal normal. No drainage or tenderness. No middle ear effusion. Tympanic membrane is not erythematous.     Left Ear: Tympanic membrane and ear canal normal. No drainage or tenderness.  No middle ear effusion. Tympanic membrane is not erythematous.     Nose: Nose normal. No congestion or rhinorrhea.     Mouth/Throat:      Mouth: Mucous membranes are moist. No oral lesions.     Pharynx: No pharyngeal swelling, oropharyngeal exudate, posterior oropharyngeal erythema or uvula swelling.     Tonsils: No tonsillar exudate or tonsillar abscesses.     Comments: Poor dentition, worse over the left lower side with gingival recession.  Multiple dental caries of the lower teeth, multiple posterior missing molars. Eyes:     General: No scleral icterus.       Right eye: No discharge.        Left eye: No discharge.     Extraocular Movements: Extraocular movements intact.     Right eye: Normal extraocular motion.     Left eye: Normal extraocular motion.     Conjunctiva/sclera: Conjunctivae normal.     Pupils: Pupils are equal, round, and reactive to light.  Cardiovascular:     Rate and Rhythm: Normal rate and regular rhythm.     Pulses: Normal pulses.     Heart sounds: Normal heart sounds. No murmur heard.   No friction rub. No gallop.  Pulmonary:     Effort: Pulmonary effort is normal. No respiratory distress.     Breath sounds: Normal breath sounds. No stridor. No wheezing, rhonchi or rales.  Musculoskeletal:     Cervical back: Normal range of motion and neck supple.  Lymphadenopathy:     Cervical: No cervical adenopathy.  Skin:    General: Skin is warm and dry.     Findings: No rash.  Neurological:     General: No focal deficit present.     Mental Status: She is alert and oriented to person, place, and time.  Psychiatric:        Mood and Affect: Mood normal.        Behavior: Behavior normal.        Thought Content: Thought content normal.    Assessment and Plan :   PDMP not reviewed this encounter.  1. Pain, dental   2. Dental infection   3. Facial pain   4. Dysphagia, unspecified type   5. Gastroesophageal reflux disease with esophagitis, unspecified whether hemorrhage   6. Erosive esophagitis    Start Augmentin for dental infection/abscess, use Tylenol for pain. Emphasized need for dental  surgeon consult.  Maintain acid reflux medications.  Suspect that erosive esophagitis is the source of her difficulty swallowing but needs follow-up with her gastroenterologist.  Patient will make an appointment with them.  Counseled patient on potential for adverse effects with medications prescribed/recommended today, strict ER and return-to-clinic precautions discussed, patient verbalized understanding.    Jaynee Eagles, [place] 07/15/21 [phone]

## 2021-07-19 DIAGNOSIS — K047 Periapical abscess without sinus: Secondary | ICD-10-CM | POA: Diagnosis not present

## 2021-07-19 DIAGNOSIS — R6884 Jaw pain: Secondary | ICD-10-CM | POA: Diagnosis not present

## 2021-07-22 DIAGNOSIS — K219 Gastro-esophageal reflux disease without esophagitis: Secondary | ICD-10-CM | POA: Diagnosis not present

## 2021-07-22 DIAGNOSIS — I10 Essential (primary) hypertension: Secondary | ICD-10-CM | POA: Diagnosis not present

## 2021-08-13 ENCOUNTER — Other Ambulatory Visit (HOSPITAL_COMMUNITY): Payer: Self-pay | Admitting: Internal Medicine

## 2021-08-13 DIAGNOSIS — K219 Gastro-esophageal reflux disease without esophagitis: Secondary | ICD-10-CM | POA: Diagnosis not present

## 2021-08-13 DIAGNOSIS — Z1231 Encounter for screening mammogram for malignant neoplasm of breast: Secondary | ICD-10-CM

## 2021-08-13 DIAGNOSIS — I10 Essential (primary) hypertension: Secondary | ICD-10-CM | POA: Diagnosis not present

## 2021-08-21 DIAGNOSIS — K219 Gastro-esophageal reflux disease without esophagitis: Secondary | ICD-10-CM | POA: Diagnosis not present

## 2021-08-21 DIAGNOSIS — I10 Essential (primary) hypertension: Secondary | ICD-10-CM | POA: Diagnosis not present

## 2021-09-07 DIAGNOSIS — Z1152 Encounter for screening for COVID-19: Secondary | ICD-10-CM | POA: Diagnosis not present

## 2021-09-18 ENCOUNTER — Ambulatory Visit (HOSPITAL_COMMUNITY)
Admission: RE | Admit: 2021-09-18 | Discharge: 2021-09-18 | Disposition: A | Discharge status: Home / Self Care | Payer: Medicare Other | Source: Ambulatory Visit | Attending: Internal Medicine | Admitting: Internal Medicine

## 2021-09-18 ENCOUNTER — Other Ambulatory Visit: Payer: Self-pay

## 2021-09-18 DIAGNOSIS — Z1231 Encounter for screening mammogram for malignant neoplasm of breast: Secondary | ICD-10-CM | POA: Insufficient documentation

## 2021-09-18 DIAGNOSIS — Z23 Encounter for immunization: Secondary | ICD-10-CM | POA: Diagnosis not present

## 2021-09-20 DIAGNOSIS — I10 Essential (primary) hypertension: Secondary | ICD-10-CM | POA: Diagnosis not present

## 2021-09-20 DIAGNOSIS — K219 Gastro-esophageal reflux disease without esophagitis: Secondary | ICD-10-CM | POA: Diagnosis not present

## 2021-09-25 ENCOUNTER — Other Ambulatory Visit (HOSPITAL_COMMUNITY): Payer: Self-pay | Admitting: Internal Medicine

## 2021-09-25 DIAGNOSIS — R928 Other abnormal and inconclusive findings on diagnostic imaging of breast: Secondary | ICD-10-CM

## 2021-09-26 ENCOUNTER — Other Ambulatory Visit: Payer: Self-pay

## 2021-09-26 ENCOUNTER — Ambulatory Visit (HOSPITAL_COMMUNITY)
Admission: RE | Admit: 2021-09-26 | Discharge: 2021-09-26 | Disposition: A | Discharge status: Home / Self Care | Payer: Medicare Other | Source: Ambulatory Visit | Attending: Internal Medicine | Admitting: Internal Medicine

## 2021-09-26 DIAGNOSIS — R928 Other abnormal and inconclusive findings on diagnostic imaging of breast: Secondary | ICD-10-CM | POA: Diagnosis not present

## 2021-09-26 DIAGNOSIS — R922 Inconclusive mammogram: Secondary | ICD-10-CM | POA: Diagnosis not present

## 2021-09-27 ENCOUNTER — Other Ambulatory Visit: Payer: Self-pay | Admitting: Internal Medicine

## 2021-09-27 ENCOUNTER — Other Ambulatory Visit (HOSPITAL_COMMUNITY): Payer: Self-pay | Admitting: Internal Medicine

## 2021-09-27 DIAGNOSIS — N6320 Unspecified lump in the left breast, unspecified quadrant: Secondary | ICD-10-CM

## 2021-09-27 DIAGNOSIS — R928 Other abnormal and inconclusive findings on diagnostic imaging of breast: Secondary | ICD-10-CM

## 2021-10-09 DIAGNOSIS — Z1152 Encounter for screening for COVID-19: Secondary | ICD-10-CM | POA: Diagnosis not present

## 2021-10-12 ENCOUNTER — Emergency Department (HOSPITAL_COMMUNITY)
Admission: EM | Admit: 2021-10-12 | Discharge: 2021-10-12 | Disposition: A | Discharge status: Home / Self Care | Payer: Medicare Other | Attending: Emergency Medicine | Admitting: Emergency Medicine

## 2021-10-12 ENCOUNTER — Encounter (HOSPITAL_COMMUNITY): Payer: Self-pay

## 2021-10-12 ENCOUNTER — Emergency Department (HOSPITAL_COMMUNITY): Payer: Medicare Other

## 2021-10-12 DIAGNOSIS — I10 Essential (primary) hypertension: Secondary | ICD-10-CM | POA: Diagnosis not present

## 2021-10-12 DIAGNOSIS — Z79899 Other long term (current) drug therapy: Secondary | ICD-10-CM | POA: Diagnosis not present

## 2021-10-12 DIAGNOSIS — M11231 Other chondrocalcinosis, right wrist: Secondary | ICD-10-CM | POA: Diagnosis not present

## 2021-10-12 DIAGNOSIS — M25531 Pain in right wrist: Secondary | ICD-10-CM | POA: Diagnosis present

## 2021-10-12 MED ORDER — PREDNISONE 20 MG PO TABS
40.0000 mg | ORAL_TABLET | Freq: Once | ORAL | Status: AC
Start: 2021-10-12 — End: 2021-10-12
  Administered 2021-10-12: 40 mg via ORAL
  Filled 2021-10-12: qty 2

## 2021-10-12 MED ORDER — PREDNISONE 20 MG PO TABS: 40.0000 mg | ORAL_TABLET | Freq: Every day | ORAL | 0 refills | Status: AC

## 2021-10-12 NOTE — Discharge Instructions (Addendum)
Continue your Tylenol for pain.  Take the steroid prescription to help reduce the inflammation in the wrist.  Follow-up with your primary care doctor or orthopedic doctor for further evaluation.  Return for fevers chills or other concerning symptoms

## 2021-10-12 NOTE — ED Triage Notes (Signed)
Pt denies injury to right wrist, states it has been hurting for a couple weeks now.  Says she has an appointment Monday but needs something today for the pain.

## 2021-10-12 NOTE — ED Provider Notes (Signed)
[facility] EMERGENCY DEPARTMENT Provider Note   CSN: [id number] Arrival date & time: 10/12/21  0720     History  Chief complaint: Wrist pain  Tricia Jenkins is a 82 y.o. female.  HPI  Patient has history of hypertension, reflux esophagitis who presents with complaints of right wrist pain.  Patient states her symptoms started on Thursday.  She called her doctor and has an appointment on Monday but the symptoms are worsening and her pain is not controlled.  Patient denies any recent falls or injuries.  The pain is in the wrist and increases with movement.  She denies any history of similar symptoms in the past.  No history of gout.  She is not having any fevers or chills.  Home Medications Prior to Admission medications   Medication Sig Start Date End Date Taking? Authorizing Provider  predniSONE (DELTASONE) 20 MG tablet Take 2 tablets (40 mg total) by mouth daily with breakfast for 5 days. 10/12/21 10/17/21 Yes Dorie Rank, MD  amLODipine (NORVASC) 5 MG tablet Take 5 mg by mouth daily.      [provider]  amoxicillin-clavulanate (AUGMENTIN) 875-125 MG tablet Take 1 tablet by mouth 2 (two) times daily. 07/15/21   Jaynee Eagles, PA-C  cyclobenzaprine (FLEXERIL) 10 MG tablet Take 10 mg by mouth 3 (three) times daily as needed for muscle spasms.    [provider]  gabapentin (NEURONTIN) 300 MG capsule Take 1 capsule by mouth 3 (three) times daily. 11/11/17   [provider]  HYDROcodone-acetaminophen (NORCO/VICODIN) 5-325 MG tablet Take 1 tablet by mouth every 6 (six) hours as needed for moderate pain. 08/28/20   Milton Ferguson, MD  losartan (COZAAR) 100 MG tablet Take 100 mg by mouth daily.    [provider]  magnesium oxide (MAG-OX) 400 MG tablet Take 400 mg by mouth daily.      [provider]  omeprazole (PRILOSEC) 20 MG capsule Take one capsule once to twice daily before meals for acid reflux/heartburn. 11/20/17   Mahala Menghini, PA-C       Allergies    Naproxen and Piroxicam    Review of Systems   Review of Systems  Constitutional:  Negative for fever.   Physical Exam Updated Vital Signs BP (!) 160/82 (BP Location: Right Arm)    Pulse 92    Temp 97.9 F (36.6 C) (Oral)    Resp 18    Ht 1.651 m (5\' 5" )    Wt 81.6 kg    SpO2 100%    BMI 29.95 kg/m  Physical Exam Vitals and nursing note reviewed.  Constitutional:      General: She is not in acute distress.    Appearance: She is well-developed.  HENT:     Head: Normocephalic and atraumatic.     Right Ear: External ear normal.     Left Ear: External ear normal.  Eyes:     General: No scleral icterus.       Right eye: No discharge.        Left eye: No discharge.     Conjunctiva/sclera: Conjunctivae normal.  Neck:     Trachea: No tracheal deviation.  Cardiovascular:     Rate and Rhythm: Normal rate.  Pulmonary:     Effort: Pulmonary effort is normal. No respiratory distress.     Breath sounds: No stridor.  Abdominal:     General: There is no distension.  Musculoskeletal:        General: Swelling and tenderness  present. No deformity.     Right elbow: Normal.     Right wrist: Tenderness and bony tenderness present. No deformity.     Cervical back: Neck supple.     Comments: Mild erythema noted around right wrist  Skin:    General: Skin is warm and dry.     Findings: No rash.  Neurological:     Mental Status: She is alert.     Cranial Nerves: Cranial nerve deficit: no gross deficits.    ED Results / Procedures / Treatments   Labs (all labs ordered are listed, but only abnormal results are displayed) Labs Reviewed - No data to display  EKG None  Radiology DG Wrist Complete Right  Result Date: 10/12/2021 CLINICAL DATA:  Wrist pain. EXAM: RIGHT WRIST - COMPLETE 3+ VIEW COMPARISON:  08/28/2020 FINDINGS: There is no evidence of fracture or dislocation. There is no evidence of arthropathy or other focal bone abnormality. Chondrocalcinosis noted. Soft  tissues are unremarkable. IMPRESSION: 1. No acute findings. 2. Chondrocalcinosis. Electronically Signed   By: Kerby Moors M.D.   On: 10/12/2021 08:38    Procedures Procedures    Medications Ordered in ED Medications  predniSONE (DELTASONE) tablet 40 mg (has no administration in time range)    ED Course/ Medical Decision Making/ A&P Clinical Course as of 10/12/21 0851  Sat Oct 12, 2021  0845 DG Wrist Complete Right X-ray images and radiology report reviewed.  Patient has evidence of chondrocalcinosis. [JK]    Clinical Course User Index [JK] Dorie Rank, MD                           Medical Decision Making Amount and/or Complexity of Data Reviewed Radiology: ordered and independent interpretation performed. Decision-making details documented in ED Course.  Risk Prescription drug management.   Patient with complaints of wrist pain.  No recent trauma.  No fevers or chills.  Doubt infection.  X-rays show evidence of chondrocalcinosis.  Likely etiology for her symptoms.  Will treat with steroids and wrist-brace.  Patient has been taking Tylenol for pain.  Outpatient follow-up with Ortho.        Final Clinical Impression(s) / ED Diagnoses Final diagnoses:  Chondrocalcinosis of right wrist    Rx / DC Orders ED Discharge Orders          Ordered    predniSONE (DELTASONE) 20 MG tablet  Daily with breakfast        10/12/21 0849              Dorie Rank, MD 10/12/21 [phone]

## 2021-10-16 DIAGNOSIS — M79672 Pain in left foot: Secondary | ICD-10-CM | POA: Diagnosis not present

## 2021-10-16 DIAGNOSIS — M79671 Pain in right foot: Secondary | ICD-10-CM | POA: Diagnosis not present

## 2021-10-16 DIAGNOSIS — M1990 Unspecified osteoarthritis, unspecified site: Secondary | ICD-10-CM | POA: Diagnosis not present

## 2021-10-17 ENCOUNTER — Other Ambulatory Visit (HOSPITAL_COMMUNITY): Payer: Self-pay | Admitting: Diagnostic Radiology

## 2021-10-17 ENCOUNTER — Ambulatory Visit
Admission: RE | Admit: 2021-10-17 | Discharge: 2021-10-17 | Disposition: A | Discharge status: Home / Self Care | Payer: Medicare Other | Source: Ambulatory Visit | Attending: Internal Medicine | Admitting: Internal Medicine

## 2021-10-17 DIAGNOSIS — N6320 Unspecified lump in the left breast, unspecified quadrant: Secondary | ICD-10-CM

## 2021-10-17 DIAGNOSIS — R928 Other abnormal and inconclusive findings on diagnostic imaging of breast: Secondary | ICD-10-CM

## 2021-10-17 DIAGNOSIS — N62 Hypertrophy of breast: Secondary | ICD-10-CM | POA: Diagnosis not present

## 2021-10-17 DIAGNOSIS — N649 Disorder of breast, unspecified: Secondary | ICD-10-CM | POA: Diagnosis not present

## 2021-10-20 DIAGNOSIS — K219 Gastro-esophageal reflux disease without esophagitis: Secondary | ICD-10-CM | POA: Diagnosis not present

## 2021-10-20 DIAGNOSIS — I10 Essential (primary) hypertension: Secondary | ICD-10-CM | POA: Diagnosis not present

## 2021-10-22 ENCOUNTER — Other Ambulatory Visit (HOSPITAL_COMMUNITY): Payer: Self-pay | Admitting: Surgery

## 2021-10-22 ENCOUNTER — Other Ambulatory Visit: Payer: Self-pay

## 2021-10-22 ENCOUNTER — Ambulatory Visit (INDEPENDENT_AMBULATORY_CARE_PROVIDER_SITE_OTHER): Payer: Medicare Other | Admitting: Surgery

## 2021-10-22 VITALS — BP 124/82 | HR 87 | Temp 98.6°F | Resp 16 | Ht 65.0 in | Wt 182.0 lb

## 2021-10-22 DIAGNOSIS — N6489 Other specified disorders of breast: Secondary | ICD-10-CM | POA: Diagnosis not present

## 2021-10-22 DIAGNOSIS — R928 Other abnormal and inconclusive findings on diagnostic imaging of breast: Secondary | ICD-10-CM

## 2021-10-22 NOTE — Progress Notes (Signed)
[facility] History and Physical  Reason for Referral: Left breast complex sclerosing lesions x2 Referring Physician: Asencion Noble, MD  Chief Complaint   New Patient (Initial Visit)     Tricia Jenkins is a 81 y.o. female.  HPI: Patient presents with recently noted complex sclerosing lesions x2 of the left breast.  She states that she was undergoing her annual mammogram, when something abnormal was noted.  She has undergone biopsy of a lesion in the left upper inner quadrant and an additional biopsy of the lesion in the left lower outer quadrant.  She denies having any breast pain, excluding some soreness related to her biopsy sites.  She denies ever feeling any masses in her breast.  She denies fever, chills, nipple drainage.  She has never had any biopsies of her breast.  Her only family history of breast cancer is in her niece (her sister's daughter).  She started menarche at 2 to 81 years of age, had her first child at 36, went through menopause somewhere between the ages of 72 and 29, and she denies any history of hormone replacement.  Her surgical history significant for hemorrhoid surgery.  She denies use of blood thinning medications.  She denies use of tobacco, alcohol, and illicit drugs.   Past Medical History:  Diagnosis Date   Erosive esophagitis    GERD (gastroesophageal reflux disease)    HTN (hypertension)    Internal hemorrhoids 12/02/2002    Past Surgical History:  Procedure Laterality Date   APPENDECTOMY     BUNIONECTOMY     bilateral x5   COLONOSCOPY  10/07/2012   RMR: Colorectal polyps-treated and/or removed as described above. Colonic diverticulosis.(serrated adenomas) next TCS 5 years   COLONOSCOPY W/ BIOPSIES  12/08/2002   RMR: Internal hemorrhoids, otherwise normal rectum Polyp at endocecum cold biopsied/removed Abnormal appearing ileocecal valve (of doubtful clinical significance)  biopsy showed chronic active colitis with significant lymphocytic  infiltrate   ESOPHAGOGASTRODUODENOSCOPY  04/28/2007   RMR: Distal esophageal erosions consistent with mild to moderate  reflux esophagitis, otherwise, normal esophagus, small hiatal hernia; otherwise, normal stomach, first and second duodenum   HEMORRHOID SURGERY     KIDNEY STONE SURGERY     SHOULDER SURGERY     left    Family History  Problem Relation Age of Onset   CAD Mother        deceased age 37   Pneumonia Father        deceased age 45/also with h/o CVA   Colon cancer Neg Hx     Social History   Tobacco Use   Smoking status: Former    Packs/day: 1.00    Years: 15.00    Pack years: 15.00    Types: Cigarettes   Smokeless tobacco: Never  Substance Use Topics   Alcohol use: No   Drug use: No    Medications: I have reviewed the patient's current medications. Allergies as of 10/22/2021       Reactions   Naproxen    Piroxicam         Medication List        Accurate as of October 22, 2021  9:12 AM. If you have any questions, ask your nurse or doctor.          STOP taking these medications    amoxicillin-clavulanate 875-125 MG tablet Commonly known as: AUGMENTIN Stopped by: Nuel Dejaynes A Delman Goshorn, DO   cyclobenzaprine 10 MG tablet Commonly known as: FLEXERIL Stopped by: Rusty Aus,  DO   losartan 100 MG tablet Commonly known as: COZAAR Stopped by: Jaydeen Darley A Shaniyah Wix, DO   magnesium oxide 400 MG tablet Commonly known as: MAG-OX Stopped by: Iman Orourke A Cortlyn Cannell, DO       TAKE these medications    amLODipine 5 MG tablet Commonly known as: NORVASC Take 5 mg by mouth daily.   gabapentin 300 MG capsule Commonly known as: NEURONTIN Take 1 capsule by mouth 3 (three) times daily.   HYDROcodone-acetaminophen 5-325 MG tablet Commonly known as: NORCO/VICODIN Take 1 tablet by mouth every 6 (six) hours as needed for moderate pain.   omeprazole 20 MG capsule Commonly known as: PRILOSEC Take one capsule once to twice daily before  meals for acid reflux/heartburn.         ROS:  Constitutional: negative for chills, fatigue, and fevers Eyes: negative for visual disturbance and eye pain Ears, nose, mouth, throat, and face: negative for ear drainage, sore throat, and sinus problems Respiratory: negative for cough, wheezing, and shortness of breath Cardiovascular: negative for chest pain and palpitations Gastrointestinal: positive for reflux symptoms, negative for abdominal pain, nausea, and vomiting Genitourinary:negative for dysuria, frequency, and urinary retention Integument/breast: negative for dryness and rash Hematologic/lymphatic: negative for bleeding and lymphadenopathy Musculoskeletal:negative for back pain, neck pain, and joint pain Neurological: negative for dizziness, tremors, and numbness Endocrine: negative for temperature intolerance  Blood pressure 124/82, pulse 87, temperature 98.6 F (37 C), temperature source Other (Comment), resp. rate 16, height 5\' 5"  (1.651 m), weight 182 lb (82.6 kg), SpO2 95 %. Physical Exam Vitals reviewed.  Constitutional:      Appearance: Normal appearance.  HENT:     Head: Normocephalic and atraumatic.  Eyes:     Extraocular Movements: Extraocular movements intact.     Pupils: Pupils are equal, round, and reactive to light.  Cardiovascular:     Rate and Rhythm: Normal rate and regular rhythm.  Pulmonary:     Effort: Pulmonary effort is normal.     Breath sounds: Normal breath sounds.  Chest:  Breasts:    Right: Normal. No swelling, inverted nipple, mass, nipple discharge, skin change or tenderness.     Left: Normal. No swelling, inverted nipple, mass, nipple discharge, skin change or tenderness.  Abdominal:     Palpations: Abdomen is soft.     Tenderness: There is no abdominal tenderness.  Musculoskeletal:        General: Normal range of motion.     Cervical back: Normal range of motion.  Lymphadenopathy:     Upper Body:     Right upper body: No axillary  adenopathy.     Left upper body: No axillary adenopathy.  Skin:    General: Skin is warm and dry.  Neurological:     General: No focal deficit present.     Mental Status: She is alert and oriented to person, place, and time.  Psychiatric:        Mood and Affect: Mood normal.        Behavior: Behavior normal.    Results: Bilateral breast mammogram (09/19/2021): IMPRESSION: Further evaluation is suggested for possible asymmetry in the left breast.   RECOMMENDATION: Diagnostic mammogram and possibly ultrasound of the left breast. (Code:FI-L-69M)   The patient will be contacted regarding the findings, and additional imaging will be scheduled.   BI-RADS CATEGORY  0: Incomplete. Need additional imaging evaluation and/or prior mammograms for comparison.  Left breast diagnostic mammogram (09/26/2021): IMPRESSION: Two areas of distortion in the LEFT breast, 1  in the [place] and a second in the [place].   No sonographic correlates are seen for these findings.   No LEFT axillary adenopathy.   RECOMMENDATION: Recommend 2 site stereotactic guided core biopsy of LEFT breast.   I have discussed the findings and recommendations with the patient. If applicable, a reminder letter will be sent to the patient regarding the next appointment.   BI-RADS CATEGORY  4: Suspicious.  Left breast ultrasound (09/26/2021): IMPRESSION: Two areas of distortion in the LEFT breast, 1 in the [place] and a second in the [place].   No sonographic correlates are seen for these findings.   No LEFT axillary adenopathy.   RECOMMENDATION: Recommend 2 site stereotactic guided core biopsy of LEFT breast.   I have discussed the findings and recommendations with the patient. If applicable, a reminder letter will be sent to the patient regarding the next appointment.   BI-RADS CATEGORY  4: Suspicious.  Pathology: Pathology revealed COMPLEX SCLEROSING  LESION of the LEFT breast, lower outer quadrant (X clip). This was found to be concordant by Dr. Nolon Nations, with surgical consultation for excision recommended.   Pathology revealed COMPLEX SCLEROSING LESION WITH USUAL DUCTAL HYPERPLASIA of the LEFT breast, upper inner quadrant (coil clip). This was found to be concordant by Dr. Nolon Nations, with surgical consultation for excision recommended.   Assessment & Plan:  Tricia Jenkins is a 81 y.o. female who presents with 2 complex sclerosing lesions of the left breast.  -Imaging evaluated by myself, 1 area of concern noted in the left upper inner quadrant and an additional area of concern in the left outer lower quadrant -Discussed with patient that while the biopsy results do not demonstrate any cancer or premalignant lesions, there could still be problematic pathology that was unable to be sampled in these areas. -It was further discussed that it would be recommended for the patient to undergo excisional biopsy of both of these concerning areas.  The risk and benefits of left breast excisional biopsy x2 were discussed but not limited to bleeding, infection, injury to surrounding structures, need for additional procedure, and seroma formation.  After careful consideration, the patient has decided to proceed with left breast excisional biopsy x2 -Patient scheduled for radiofrequency tag placement in the 2 areas of concern -Patient tentatively scheduled for surgery on February 10th -According to the [facility] for [place], she has a 1.4% 5-year risk of breast cancer and a 2.1% lifetime risk of breast cancer  All questions were answered to the satisfaction of the patient and family.   Graciella Freer, DO [facility] [address] [place], [place] [address] [phone] (office)

## 2021-10-22 NOTE — H&P (Signed)
[facility] History and Physical   Reason for Referral: Left breast complex sclerosing lesions x2 Referring Physician: Asencion Noble, MD   Chief Complaint   New Patient (Initial Visit)        Tricia Jenkins is a 81 y.o. female.  HPI: Patient presents with recently noted complex sclerosing lesions x2 of the left breast.  She states that she was undergoing her annual mammogram, when something abnormal was noted.  She has undergone biopsy of a lesion in the left upper inner quadrant and an additional biopsy of the lesion in the left lower outer quadrant.  She denies having any breast pain, excluding some soreness related to her biopsy sites.  She denies ever feeling any masses in her breast.  She denies fever, chills, nipple drainage.  She has never had any biopsies of her breast.  Her only family history of breast cancer is in her niece (her sister's daughter).  She started menarche at 69 to 81 years of age, had her first child at 72, went through menopause somewhere between the ages of 89 and 33, and she denies any history of hormone replacement.  Her surgical history significant for hemorrhoid surgery.  She denies use of blood thinning medications.  She denies use of tobacco, alcohol, and illicit drugs.         Past Medical History:  Diagnosis Date   Erosive esophagitis     GERD (gastroesophageal reflux disease)     HTN (hypertension)     Internal hemorrhoids 12/02/2002           Past Surgical History:  Procedure Laterality Date   APPENDECTOMY       BUNIONECTOMY        bilateral x5   COLONOSCOPY   10/07/2012    RMR: Colorectal polyps-treated and/or removed as described above. Colonic diverticulosis.(serrated adenomas) next TCS 5 years   COLONOSCOPY W/ BIOPSIES   12/08/2002    RMR: Internal hemorrhoids, otherwise normal rectum Polyp at endocecum cold biopsied/removed Abnormal appearing ileocecal valve (of doubtful clinical significance)  biopsy showed chronic active colitis  with significant lymphocytic infiltrate   ESOPHAGOGASTRODUODENOSCOPY   04/28/2007    RMR: Distal esophageal erosions consistent with mild to moderate  reflux esophagitis, otherwise, normal esophagus, small hiatal hernia; otherwise, normal stomach, first and second duodenum   HEMORRHOID SURGERY       KIDNEY STONE SURGERY       SHOULDER SURGERY        left           Family History  Problem Relation Age of Onset   CAD Mother          deceased age 46   Pneumonia Father          deceased age 31/also with h/o CVA   Colon cancer Neg Hx        Social History         Tobacco Use   Smoking status: Former      Packs/day: 1.00      Years: 15.00      Pack years: 15.00      Types: Cigarettes   Smokeless tobacco: Never  Substance Use Topics   Alcohol use: No   Drug use: No      Medications: I have reviewed the patient's current medications. Allergies as of 10/22/2021         Reactions    Naproxen      Piroxicam  Medication List           Accurate as of October 22, 2021  9:12 AM. If you have any questions, ask your nurse or doctor.              STOP taking these medications     amoxicillin-clavulanate 875-125 MG tablet Commonly known as: AUGMENTIN Stopped by: Kristinia Leavy A Skipper Dacosta, DO    cyclobenzaprine 10 MG tablet Commonly known as: FLEXERIL Stopped by: Cornelis Kluver A Harlynn Kimbell, DO    losartan 100 MG tablet Commonly known as: COZAAR Stopped by: Tymara Saur A Ellie Spickler, DO    magnesium oxide 400 MG tablet Commonly known as: MAG-OX Stopped by: Yeriel Mineo A Kaisley Stiverson, DO           TAKE these medications     amLODipine 5 MG tablet Commonly known as: NORVASC Take 5 mg by mouth daily.    gabapentin 300 MG capsule Commonly known as: NEURONTIN Take 1 capsule by mouth 3 (three) times daily.    HYDROcodone-acetaminophen 5-325 MG tablet Commonly known as: NORCO/VICODIN Take 1 tablet by mouth every 6 (six) hours as needed for moderate pain.     omeprazole 20 MG capsule Commonly known as: PRILOSEC Take one capsule once to twice daily before meals for acid reflux/heartburn.               ROS:  Constitutional: negative for chills, fatigue, and fevers Eyes: negative for visual disturbance and eye pain Ears, nose, mouth, throat, and face: negative for ear drainage, sore throat, and sinus problems Respiratory: negative for cough, wheezing, and shortness of breath Cardiovascular: negative for chest pain and palpitations Gastrointestinal: positive for reflux symptoms, negative for abdominal pain, nausea, and vomiting Genitourinary:negative for dysuria, frequency, and urinary retention Integument/breast: negative for dryness and rash Hematologic/lymphatic: negative for bleeding and lymphadenopathy Musculoskeletal:negative for back pain, neck pain, and joint pain Neurological: negative for dizziness, tremors, and numbness Endocrine: negative for temperature intolerance   Blood pressure 124/82, pulse 87, temperature 98.6 F (37 C), temperature source Other (Comment), resp. rate 16, height 5\' 5"  (1.651 m), weight 182 lb (82.6 kg), SpO2 95 %. Physical Exam Vitals reviewed.  Constitutional:      Appearance: Normal appearance.  HENT:     Head: Normocephalic and atraumatic.  Eyes:     Extraocular Movements: Extraocular movements intact.     Pupils: Pupils are equal, round, and reactive to light.  Cardiovascular:     Rate and Rhythm: Normal rate and regular rhythm.  Pulmonary:     Effort: Pulmonary effort is normal.     Breath sounds: Normal breath sounds.  Chest:  Breasts:    Right: Normal. No swelling, inverted nipple, mass, nipple discharge, skin change or tenderness.     Left: Normal. No swelling, inverted nipple, mass, nipple discharge, skin change or tenderness.  Abdominal:     Palpations: Abdomen is soft.     Tenderness: There is no abdominal tenderness.  Musculoskeletal:        General: Normal range of motion.      Cervical back: Normal range of motion.  Lymphadenopathy:     Upper Body:     Right upper body: No axillary adenopathy.     Left upper body: No axillary adenopathy.  Skin:    General: Skin is warm and dry.  Neurological:     General: No focal deficit present.     Mental Status: She is alert and oriented to person, place, and time.  Psychiatric:  Mood and Affect: Mood normal.        Behavior: Behavior normal.      Results: Bilateral breast mammogram (09/19/2021): IMPRESSION: Further evaluation is suggested for possible asymmetry in the left breast.   RECOMMENDATION: Diagnostic mammogram and possibly ultrasound of the left breast. (Code:FI-L-25M)   The patient will be contacted regarding the findings, and additional imaging will be scheduled.   BI-RADS CATEGORY  0: Incomplete. Need additional imaging evaluation and/or prior mammograms for comparison.   Left breast diagnostic mammogram (09/26/2021): IMPRESSION: Two areas of distortion in the LEFT breast, 1 in the [place] and a second in the [place].   No sonographic correlates are seen for these findings.   No LEFT axillary adenopathy.   RECOMMENDATION: Recommend 2 site stereotactic guided core biopsy of LEFT breast.   I have discussed the findings and recommendations with the patient. If applicable, a reminder letter will be sent to the patient regarding the next appointment.   BI-RADS CATEGORY  4: Suspicious.   Left breast ultrasound (09/26/2021): IMPRESSION: Two areas of distortion in the LEFT breast, 1 in the [place] and a second in the [place].   No sonographic correlates are seen for these findings.   No LEFT axillary adenopathy.   RECOMMENDATION: Recommend 2 site stereotactic guided core biopsy of LEFT breast.   I have discussed the findings and recommendations with the patient. If applicable, a reminder letter will be sent to the patient regarding  the next appointment.   BI-RADS CATEGORY  4: Suspicious.   Pathology: Pathology revealed COMPLEX SCLEROSING LESION of the LEFT breast, lower outer quadrant (X clip). This was found to be concordant by Dr. Nolon Nations, with surgical consultation for excision recommended.   Pathology revealed COMPLEX SCLEROSING LESION WITH USUAL DUCTAL HYPERPLASIA of the LEFT breast, upper inner quadrant (coil clip). This was found to be concordant by Dr. Nolon Nations, with surgical consultation for excision recommended.     Assessment & Plan:  Tricia Jenkins is a 81 y.o. female who presents with 2 complex sclerosing lesions of the left breast.   -Imaging evaluated by myself, 1 area of concern noted in the left upper inner quadrant and an additional area of concern in the left outer lower quadrant -Discussed with patient that while the biopsy results do not demonstrate any cancer or premalignant lesions, there could still be problematic pathology that was unable to be sampled in these areas. -It was further discussed that it would be recommended for the patient to undergo excisional biopsy of both of these concerning areas.  The risk and benefits of left breast excisional biopsy x2 were discussed but not limited to bleeding, infection, injury to surrounding structures, need for additional procedure, and seroma formation.  After careful consideration, the patient has decided to proceed with left breast excisional biopsy x2 -Patient scheduled for radiofrequency tag placement in the 2 areas of concern -Patient tentatively scheduled for surgery on February 10th -According to the [facility] for [place], she has a 1.4% 5-year risk of breast cancer and a 2.1% lifetime risk of breast cancer   All questions were answered to the satisfaction of the patient and family.     Graciella Freer, DO [facility] [address] [place], [place] [id number] [phone]  (office)

## 2021-10-23 DIAGNOSIS — M11261 Other chondrocalcinosis, right knee: Secondary | ICD-10-CM | POA: Diagnosis not present

## 2021-10-28 NOTE — Patient Instructions (Signed)
Your procedure is scheduled on: 11/01/2021  Report to [facility] at   11:00  AM.  Call this number if you have problems the morning of surgery: [phone]   Remember:   Do not Eat or Drink after midnight         No Smoking the morning of surgery  :  Take these medicines the morning of surgery with A SIP OF WATER: Amlodipine, Gabapentin, and omeprazole   Do not wear jewelry, make-up or nail polish.  Do not wear lotions, powders, or perfumes. You may wear deodorant.  Do not shave 48 hours prior to surgery. Men may shave face and neck.  Do not bring valuables to the hospital.  Contacts, dentures or bridgework may not be worn into surgery.  Leave suitcase in the car. After surgery it may be brought to your room.  For patients admitted to the hospital, checkout time is 11:00 AM the day of discharge.   Patients discharged the day of surgery will not be allowed to drive home.    Special Instructions: Shower using CHG night before surgery and shower the day of surgery use CHG.  Use special wash - you have one bottle of CHG for all showers.  You should use approximately 1/2 of the bottle for each shower.  How to Use Chlorhexidine for Bathing Chlorhexidine gluconate (CHG) is a germ-killing (antiseptic) solution that is used to clean the skin. It can get rid of the bacteria that normally live on the skin and can keep them away for about 24 hours. To clean your skin with CHG, you may be given: A CHG solution to use in the shower or as part of a sponge bath. A prepackaged cloth that contains CHG. Cleaning your skin with CHG may help lower the risk for infection: While you are staying in the intensive care unit of the hospital. If you have a vascular access, such as a central line, to provide short-term or long-term access to your veins. If you have a catheter to drain urine from your bladder. If you are on a ventilator. A ventilator is a machine that helps you breathe by moving  air in and out of your lungs. After surgery. What are the risks? Risks of using CHG include: A skin reaction. Hearing loss, if CHG gets in your ears and you have a perforated eardrum. Eye injury, if CHG gets in your eyes and is not rinsed out. The CHG product catching fire. Make sure that you avoid smoking and flames after applying CHG to your skin. Do not use CHG: If you have a chlorhexidine allergy or have previously reacted to chlorhexidine. On babies younger than 40 months of age. How to use CHG solution Use CHG only as told by your health care provider, and follow the instructions on the label. Use the full amount of CHG as directed. Usually, this is one bottle. During a shower Follow these steps when using CHG solution during a shower (unless your health care provider gives you different instructions): Start the shower. Use your normal soap and shampoo to wash your face and hair. Turn off the shower or move out of the shower stream. Pour the CHG onto a clean washcloth. Do not use any type of brush or rough-edged sponge. Starting at your neck, lather your body down to your toes. Make sure you follow these instructions: If you will be having surgery, pay special attention to the part of your body where you will be  having surgery. Scrub this area for at least 1 minute. Do not use CHG on your head or face. If the solution gets into your ears or eyes, rinse them well with water. Avoid your genital area. Avoid any areas of skin that have broken skin, cuts, or scrapes. Scrub your back and under your arms. Make sure to wash skin folds. Let the lather sit on your skin for 1-2 minutes or as long as told by your health care provider. Thoroughly rinse your entire body in the shower. Make sure that all body creases and crevices are rinsed well. Dry off with a clean towel. Do not put any substances on your body afterward--such as powder, lotion, or perfume--unless you are told to do so by your  health care provider. Only use lotions that are recommended by the manufacturer. Put on clean clothes or pajamas. If it is the night before your surgery, sleep in clean sheets.  During a sponge bath Follow these steps when using CHG solution during a sponge bath (unless your health care provider gives you different instructions): Use your normal soap and shampoo to wash your face and hair. Pour the CHG onto a clean washcloth. Starting at your neck, lather your body down to your toes. Make sure you follow these instructions: If you will be having surgery, pay special attention to the part of your body where you will be having surgery. Scrub this area for at least 1 minute. Do not use CHG on your head or face. If the solution gets into your ears or eyes, rinse them well with water. Avoid your genital area. Avoid any areas of skin that have broken skin, cuts, or scrapes. Scrub your back and under your arms. Make sure to wash skin folds. Let the lather sit on your skin for 1-2 minutes or as long as told by your health care provider. Using a different clean, wet washcloth, thoroughly rinse your entire body. Make sure that all body creases and crevices are rinsed well. Dry off with a clean towel. Do not put any substances on your body afterward--such as powder, lotion, or perfume--unless you are told to do so by your health care provider. Only use lotions that are recommended by the manufacturer. Put on clean clothes or pajamas. If it is the night before your surgery, sleep in clean sheets. How to use CHG prepackaged cloths Only use CHG cloths as told by your health care provider, and follow the instructions on the label. Use the CHG cloth on clean, dry skin. Do not use the CHG cloth on your head or face unless your health care provider tells you to. When washing with the CHG cloth: Avoid your genital area. Avoid any areas of skin that have broken skin, cuts, or scrapes. Before surgery Follow  these steps when using a CHG cloth to clean before surgery (unless your health care provider gives you different instructions): Using the CHG cloth, vigorously scrub the part of your body where you will be having surgery. Scrub using a back-and-forth motion for 3 minutes. The area on your body should be completely wet with CHG when you are done scrubbing. Do not rinse. Discard the cloth and let the area air-dry. Do not put any substances on the area afterward, such as powder, lotion, or perfume. Put on clean clothes or pajamas. If it is the night before your surgery, sleep in clean sheets.  For general bathing Follow these steps when using CHG cloths for general bathing (unless your  health care provider gives you different instructions). Use a separate CHG cloth for each area of your body. Make sure you wash between any folds of skin and between your fingers and toes. Wash your body in the following order, switching to a new cloth after each step: The front of your neck, shoulders, and chest. Both of your arms, under your arms, and your hands. Your stomach and groin area, avoiding the genitals. Your right leg and foot. Your left leg and foot. The back of your neck, your back, and your buttocks. Do not rinse. Discard the cloth and let the area air-dry. Do not put any substances on your body afterward--such as powder, lotion, or perfume--unless you are told to do so by your health care provider. Only use lotions that are recommended by the manufacturer. Put on clean clothes or pajamas. Contact a health care provider if: Your skin gets irritated after scrubbing. You have questions about using your solution or cloth. You swallow any chlorhexidine. Call your local poison control center (1-571-329-0306 in the [place] Get help right away if: Your eyes itch badly, or they become very red or swollen. Your skin itches badly and is red or swollen. Your hearing changes. You have trouble seeing. You have  swelling or tingling in your mouth or throat. You have trouble breathing. These symptoms may represent a serious problem that is an emergency. Do not wait to see if the symptoms will go away. Get medical help right away. Call your local emergency services (911 in the [place] Do not drive yourself to the hospital. Summary Chlorhexidine gluconate (CHG) is a germ-killing (antiseptic) solution that is used to clean the skin. Cleaning your skin with CHG may help to lower your risk for infection. You may be given CHG to use for bathing. It may be in a bottle or in a prepackaged cloth to use on your skin. Carefully follow your health care provider's instructions and the instructions on the product label. Do not use CHG if you have a chlorhexidine allergy. Contact your health care provider if your skin gets irritated after scrubbing. This information is not intended to replace advice given to you by your health care provider. Make sure you discuss any questions you have with your health care provider. Document Revised: 11/19/2020 Document Reviewed: 11/19/2020 Elsevier Patient Education  2022 [place]. Breast Biopsy, Care After This sheet gives you information about how to care for yourself after your procedure. Your health care provider may also give you more specific instructions. If you have problems or questions, contact your health care provider. What can I expect after the procedure? After your procedure, it is common to have: Bruising on your breast. Numbness, tingling, or pain near your biopsy site. Follow these instructions at home: Medicines Take over-the-counter and prescription medicines only as told by your health care provider. Do not drive for 24 hours if you were given a sedative during your procedure. Do not drink alcohol while taking pain medicine. Do not drive or use heavy machinery while taking prescription pain medicine. Biopsy site care   Follow instructions from your health  care provider about how to take care of your incision or puncture site. Make sure you: Wash your hands with soap and water before you change your bandage (dressing). If soap and water are not available, use hand sanitizer. Change your dressing as told by your health care provider. Leave any stitches (sutures), skin glue, or adhesive strips in place. These skin closures may need to  stay in place for 2 weeks or longer. If adhesive strip edges start to loosen and curl up, you may trim the loose edges. Do not remove adhesive strips completely unless your health care provider tells you to do that. If you have sutures, keep them dry when bathing. Check your incision or puncture area every day for signs of infection. Check for: Redness, swelling, or pain. Fluid or blood. Warmth. Pus or a bad smell. Protect the biopsy area. Do not let the area get bumped. Activity If you had an incision during your procedure, avoid activities that may pull the incision site open. Avoid stretching, reaching above your head, exercise, sports, or lifting anything that is heavier than 3 lb (1.4 kg). Return to your normal activities as told by your health care provider. Ask your health care provider what activities are safe for you. Managing pain If directed, put ice on the biopsy site to relieve tenderness: Put ice in a plastic bag. Place a towel between your skin and the bag. Leave the ice on for 20 minutes, 2-3 times a day. General instructions Resume your usual diet. Wear a good support bra for as long as told by your health care provider. Get checked for extra fluid around your lymph nodes (lymphedema) as often as told by your health care provider. Keep all follow-up visits as told by your health care provider. This is important. Contact a health care provider if: You have more redness, swelling, or pain at the biopsy site. You have more fluid or blood coming from your biopsy site. Your biopsy site feels warm to  the touch. You have pus or a bad smell coming from the biopsy site. Your biopsy site breaks open after the sutures or adhesive strips have been removed. You have a rash. You have a fever. Get help right away if you have: Increased bleeding (more than a small spot) from the biopsy site. Difficulty breathing. Red streaks around the biopsy site. Summary After your procedure, it is common to have bruising, numbness, tingling, or pain near the biopsy site. Do not drive or use heavy machinery while taking prescription pain medicine. Wear a good support bra for as long as told by your health care provider. If you had an incision during your procedure, avoid activities that may pull the incision site open. Ask your health care provider what activities are safe for you. This information is not intended to replace advice given to you by your health care provider. Make sure you discuss any questions you have with your health care provider. Document Revised: 05/21/2020 Document Reviewed: 02/24/2018 Elsevier Patient Education  2022 [place] Anesthesia, Adult, Care After This sheet gives you information about how to care for yourself after your procedure. Your health care provider may also give you more specific instructions. If you have problems or questions, contact your health care provider. What can I expect after the procedure? After the procedure, the following side effects are common: Pain or discomfort at the IV site. Nausea. Vomiting. Sore throat. Trouble concentrating. Feeling cold or chills. Feeling weak or tired. Sleepiness and fatigue. Soreness and body aches. These side effects can affect parts of the body that were not involved in surgery. Follow these instructions at home: For the time period you were told by your health care provider:  Rest. Do not participate in activities where you could fall or become injured. Do not drive or use machinery. Do not drink  alcohol. Do not take sleeping pills or  medicines that cause drowsiness. Do not make important decisions or sign legal documents. Do not take care of children on your own. Eating and drinking Follow any instructions from your health care provider about eating or drinking restrictions. When you feel hungry, start by eating small amounts of foods that are soft and easy to digest (bland), such as toast. Gradually return to your regular diet. Drink enough fluid to keep your urine pale yellow. If you vomit, rehydrate by drinking water, juice, or clear broth. General instructions If you have sleep apnea, surgery and certain medicines can increase your risk for breathing problems. Follow instructions from your health care provider about wearing your sleep device: Anytime you are sleeping, including during daytime naps. While taking prescription pain medicines, sleeping medicines, or medicines that make you drowsy. Have a responsible adult stay with you for the time you are told. It is important to have someone help care for you until you are awake and alert. Return to your normal activities as told by your health care provider. Ask your health care provider what activities are safe for you. Take over-the-counter and prescription medicines only as told by your health care provider. If you smoke, do not smoke without supervision. Keep all follow-up visits as told by your health care provider. This is important. Contact a health care provider if: You have nausea or vomiting that does not get better with medicine. You cannot eat or drink without vomiting. You have pain that does not get better with medicine. You are unable to pass urine. You develop a skin rash. You have a fever. You have redness around your IV site that gets worse. Get help right away if: You have difficulty breathing. You have chest pain. You have blood in your urine or stool, or you vomit blood. Summary After the procedure, it  is common to have a sore throat or nausea. It is also common to feel tired. Have a responsible adult stay with you for the time you are told. It is important to have someone help care for you until you are awake and alert. When you feel hungry, start by eating small amounts of foods that are soft and easy to digest (bland), such as toast. Gradually return to your regular diet. Drink enough fluid to keep your urine pale yellow. Return to your normal activities as told by your health care provider. Ask your health care provider what activities are safe for you. This information is not intended to replace advice given to you by your health care provider. Make sure you discuss any questions you have with your health care provider. Document Revised: 05/24/2020 Document Reviewed: 12/22/2019 Elsevier Patient Education  2022 [employer].

## 2021-10-29 ENCOUNTER — Other Ambulatory Visit: Payer: Self-pay

## 2021-10-29 ENCOUNTER — Ambulatory Visit (HOSPITAL_COMMUNITY)
Admission: RE | Admit: 2021-10-29 | Discharge: 2021-10-29 | Disposition: A | Discharge status: Home / Self Care | Payer: Medicare Other | Source: Ambulatory Visit | Attending: Surgery | Admitting: Surgery

## 2021-10-29 DIAGNOSIS — R928 Other abnormal and inconclusive findings on diagnostic imaging of breast: Secondary | ICD-10-CM | POA: Insufficient documentation

## 2021-10-29 MED ORDER — LIDOCAINE HCL (PF) 2 % IJ SOLN
INTRAMUSCULAR | Status: AC
  Filled 2021-10-29: qty 10

## 2021-10-30 ENCOUNTER — Other Ambulatory Visit: Payer: Self-pay

## 2021-10-30 ENCOUNTER — Telehealth (INDEPENDENT_AMBULATORY_CARE_PROVIDER_SITE_OTHER): Payer: Medicare Other | Admitting: Surgery

## 2021-10-30 ENCOUNTER — Encounter (HOSPITAL_COMMUNITY)
Admission: RE | Admit: 2021-10-30 | Discharge: 2021-10-30 | Disposition: A | Discharge status: Home / Self Care | Payer: Medicare Other | Source: Ambulatory Visit | Attending: Surgery | Admitting: Surgery

## 2021-10-30 ENCOUNTER — Encounter (HOSPITAL_COMMUNITY): Payer: Self-pay

## 2021-10-30 VITALS — BP 133/69 | HR 89 | Temp 98.6°F | Resp 18 | Ht 65.0 in | Wt 182.0 lb

## 2021-10-30 DIAGNOSIS — N62 Hypertrophy of breast: Secondary | ICD-10-CM | POA: Diagnosis not present

## 2021-10-30 DIAGNOSIS — K219 Gastro-esophageal reflux disease without esophagitis: Secondary | ICD-10-CM | POA: Diagnosis not present

## 2021-10-30 DIAGNOSIS — Z87891 Personal history of nicotine dependence: Secondary | ICD-10-CM | POA: Diagnosis not present

## 2021-10-30 DIAGNOSIS — N6489 Other specified disorders of breast: Secondary | ICD-10-CM | POA: Diagnosis present

## 2021-10-30 DIAGNOSIS — Z01812 Encounter for preprocedural laboratory examination: Secondary | ICD-10-CM | POA: Diagnosis not present

## 2021-10-30 DIAGNOSIS — I10 Essential (primary) hypertension: Secondary | ICD-10-CM | POA: Diagnosis not present

## 2021-10-30 DIAGNOSIS — R921 Mammographic calcification found on diagnostic imaging of breast: Secondary | ICD-10-CM | POA: Diagnosis not present

## 2021-10-30 DIAGNOSIS — Z79899 Other long term (current) drug therapy: Secondary | ICD-10-CM | POA: Insufficient documentation

## 2021-10-30 HISTORY — DX: Unspecified osteoarthritis, unspecified site: M19.90

## 2021-10-30 LAB — BASIC METABOLIC PANEL
Anion gap: 10 (ref 5–15)
BUN: 15 mg/dL (ref 8–23)
CO2: 27 mmol/L (ref 22–32)
Calcium: 8.7 mg/dL — ABNORMAL LOW (ref 8.9–10.3)
Chloride: 101 mmol/L (ref 98–111)
Creatinine, Ser: 1.18 mg/dL — ABNORMAL HIGH (ref 0.44–1.00)
GFR, Estimated: 46 mL/min — ABNORMAL LOW (ref >60)
Glucose, Bld: 101 mg/dL — ABNORMAL HIGH (ref 70–99)
Potassium: 3 mmol/L — ABNORMAL LOW (ref 3.5–5.1)
Sodium: 138 mmol/L (ref 135–145)

## 2021-10-30 MED ORDER — POTASSIUM CHLORIDE CRYS ER 20 MEQ PO TBCR: 20.0000 meq | EXTENDED_RELEASE_TABLET | Freq: Two times a day (BID) | ORAL | 0 refills | Status: DC

## 2021-10-30 NOTE — Telephone Encounter (Signed)
Called patient to inform her that on her preoperative blood work, her potassium was noted to be low.  I explained that I have sent a prescription for potassium supplementation to her local pharmacy.  She is in agreement.  I also advised her that we will proceed with her scheduled surgery as planned this Friday.  All of her questions were answered to her expressed satisfaction.  Graciella Freer, DO [facility] [address] [place], [place] [address] [phone] (office)

## 2021-10-31 NOTE — Telephone Encounter (Signed)
Results faxed to Dr. Willey Blade for review.   Call placed to PCP office and discussed results with Jewel. Reports that Cr noted at 1.03 in July 2022.

## 2021-11-01 ENCOUNTER — Ambulatory Visit (HOSPITAL_COMMUNITY)
Admission: RE | Admit: 2021-11-01 | Discharge: 2021-11-01 | Disposition: A | Discharge status: Home / Self Care | Payer: Medicare Other | Source: Ambulatory Visit | Attending: Surgery | Admitting: Surgery

## 2021-11-01 ENCOUNTER — Ambulatory Visit (HOSPITAL_COMMUNITY): Payer: Medicare Other | Admitting: Certified Registered Nurse Anesthetist

## 2021-11-01 ENCOUNTER — Encounter (HOSPITAL_COMMUNITY): Payer: Self-pay | Admitting: Surgery

## 2021-11-01 ENCOUNTER — Encounter (HOSPITAL_COMMUNITY)
Admission: RE | Disposition: A | Discharge status: Home / Self Care | Payer: Self-pay | Source: Ambulatory Visit | Attending: Surgery

## 2021-11-01 ENCOUNTER — Other Ambulatory Visit: Payer: Self-pay

## 2021-11-01 ENCOUNTER — Ambulatory Visit (HOSPITAL_BASED_OUTPATIENT_CLINIC_OR_DEPARTMENT_OTHER): Payer: Medicare Other | Admitting: Certified Registered Nurse Anesthetist

## 2021-11-01 ENCOUNTER — Ambulatory Visit (HOSPITAL_COMMUNITY): Payer: Medicare Other

## 2021-11-01 DIAGNOSIS — N6082 Other benign mammary dysplasias of left breast: Secondary | ICD-10-CM | POA: Diagnosis not present

## 2021-11-01 DIAGNOSIS — I10 Essential (primary) hypertension: Secondary | ICD-10-CM | POA: Diagnosis not present

## 2021-11-01 DIAGNOSIS — Z87891 Personal history of nicotine dependence: Secondary | ICD-10-CM | POA: Diagnosis not present

## 2021-11-01 DIAGNOSIS — N6489 Other specified disorders of breast: Secondary | ICD-10-CM | POA: Diagnosis not present

## 2021-11-01 DIAGNOSIS — K219 Gastro-esophageal reflux disease without esophagitis: Secondary | ICD-10-CM | POA: Insufficient documentation

## 2021-11-01 DIAGNOSIS — N6032 Fibrosclerosis of left breast: Secondary | ICD-10-CM | POA: Diagnosis not present

## 2021-11-01 DIAGNOSIS — R928 Other abnormal and inconclusive findings on diagnostic imaging of breast: Secondary | ICD-10-CM | POA: Diagnosis not present

## 2021-11-01 DIAGNOSIS — R921 Mammographic calcification found on diagnostic imaging of breast: Secondary | ICD-10-CM | POA: Insufficient documentation

## 2021-11-01 DIAGNOSIS — N62 Hypertrophy of breast: Secondary | ICD-10-CM | POA: Insufficient documentation

## 2021-11-01 DIAGNOSIS — N6320 Unspecified lump in the left breast, unspecified quadrant: Secondary | ICD-10-CM

## 2021-11-01 HISTORY — PX: EXCISION OF BREAST BIOPSY: SHX5822

## 2021-11-01 HISTORY — PX: BREAST LUMPECTOMY WITH RADIOFREQUENCY TAG IDENTIFICATION: SHX6884

## 2021-11-01 SURGERY — EXCISION OF BREAST BIOPSY
Anesthesia: General | Site: Breast | Laterality: Left

## 2021-11-01 MED ORDER — ACETAMINOPHEN 500 MG PO TABS
1000.0000 mg | ORAL_TABLET | Freq: Four times a day (QID) | ORAL | 0 refills | Status: AC
Start: 2021-11-01 — End: 2021-11-08

## 2021-11-01 MED ORDER — ONDANSETRON HCL 4 MG/2ML IJ SOLN
INTRAMUSCULAR | Status: AC
  Filled 2021-11-01: qty 2

## 2021-11-01 MED ORDER — CHLORHEXIDINE GLUCONATE CLOTH 2 % EX PADS: 6.0000 | MEDICATED_PAD | Freq: Once | CUTANEOUS | Status: DC

## 2021-11-01 MED ORDER — LIDOCAINE HCL (PF) 2 % IJ SOLN
INTRAMUSCULAR | Status: AC
  Filled 2021-11-01: qty 5

## 2021-11-01 MED ORDER — PHENYLEPHRINE HCL (PRESSORS) 10 MG/ML IV SOLN
INTRAVENOUS | Status: DC | PRN
  Administered 2021-11-01: 40 ug via INTRAVENOUS

## 2021-11-01 MED ORDER — LIDOCAINE HCL (CARDIAC) PF 100 MG/5ML IV SOSY
PREFILLED_SYRINGE | INTRAVENOUS | Status: DC | PRN
  Administered 2021-11-01: 60 mg via INTRAVENOUS

## 2021-11-01 MED ORDER — CEFAZOLIN SODIUM-DEXTROSE 2-4 GM/100ML-% IV SOLN
2.0000 g | INTRAVENOUS | Status: AC
  Administered 2021-11-01: 2 g via INTRAVENOUS
  Filled 2021-11-01: qty 100

## 2021-11-01 MED ORDER — FENTANYL CITRATE (PF) 100 MCG/2ML IJ SOLN
INTRAMUSCULAR | Status: DC | PRN
  Administered 2021-11-01 (×4): 25 ug via INTRAVENOUS

## 2021-11-01 MED ORDER — CHLORHEXIDINE GLUCONATE 0.12 % MT SOLN
15.0000 mL | Freq: Once | OROMUCOSAL | Status: AC
  Administered 2021-11-01: 15 mL via OROMUCOSAL

## 2021-11-01 MED ORDER — PHENYLEPHRINE 40 MCG/ML (10ML) SYRINGE FOR IV PUSH (FOR BLOOD PRESSURE SUPPORT)
PREFILLED_SYRINGE | INTRAVENOUS | Status: AC
  Filled 2021-11-01: qty 10

## 2021-11-01 MED ORDER — DOCUSATE SODIUM 100 MG PO CAPS: 100.0000 mg | ORAL_CAPSULE | Freq: Two times a day (BID) | ORAL | 0 refills | Status: AC

## 2021-11-01 MED ORDER — ONDANSETRON HCL 4 MG/2ML IJ SOLN
INTRAMUSCULAR | Status: DC | PRN
  Administered 2021-11-01: 4 mg via INTRAVENOUS

## 2021-11-01 MED ORDER — ONDANSETRON HCL 4 MG/2ML IJ SOLN: 4.0000 mg | Freq: Once | INTRAMUSCULAR | Status: DC | PRN

## 2021-11-01 MED ORDER — PROPOFOL 10 MG/ML IV BOLUS
INTRAVENOUS | Status: DC | PRN
  Administered 2021-11-01: 50 mg via INTRAVENOUS
  Administered 2021-11-01: 100 mg via INTRAVENOUS

## 2021-11-01 MED ORDER — ORAL CARE MOUTH RINSE: 15.0000 mL | Freq: Once | OROMUCOSAL | Status: AC

## 2021-11-01 MED ORDER — 0.9 % SODIUM CHLORIDE (POUR BTL) OPTIME
TOPICAL | Status: DC | PRN
  Administered 2021-11-01: 1000 mL

## 2021-11-01 MED ORDER — BUPIVACAINE HCL (PF) 0.5 % IJ SOLN
INTRAMUSCULAR | Status: DC | PRN
  Administered 2021-11-01: 20 mL

## 2021-11-01 MED ORDER — LACTATED RINGERS IV SOLN: INTRAVENOUS | Status: DC

## 2021-11-01 MED ORDER — PROPOFOL 10 MG/ML IV BOLUS
INTRAVENOUS | Status: AC
  Filled 2021-11-01: qty 20

## 2021-11-01 MED ORDER — FENTANYL CITRATE PF 50 MCG/ML IJ SOSY
25.0000 ug | PREFILLED_SYRINGE | INTRAMUSCULAR | Status: DC | PRN
  Administered 2021-11-01 (×2): 50 ug via INTRAVENOUS
  Filled 2021-11-01 (×2): qty 1

## 2021-11-01 MED ORDER — BUPIVACAINE HCL (PF) 0.5 % IJ SOLN
INTRAMUSCULAR | Status: AC
  Filled 2021-11-01: qty 30

## 2021-11-01 MED ORDER — FENTANYL CITRATE (PF) 100 MCG/2ML IJ SOLN
INTRAMUSCULAR | Status: AC
  Filled 2021-11-01: qty 2

## 2021-11-01 MED ORDER — OXYCODONE HCL 5 MG PO TABS: 5.0000 mg | ORAL_TABLET | Freq: Four times a day (QID) | ORAL | 0 refills | Status: DC | PRN

## 2021-11-01 SURGICAL SUPPLY — 34 items
ADH SKN CLS APL DERMABOND .7 (GAUZE/BANDAGES/DRESSINGS) ×1
APL PRP STRL LF DISP 70% ISPRP (MISCELLANEOUS) ×1
BINDER BREAST LRG (GAUZE/BANDAGES/DRESSINGS) ×1 IMPLANT
BLADE SURG 15 STRL LF DISP TIS (BLADE) ×2 IMPLANT
BLADE SURG 15 STRL SS (BLADE) ×2
CHLORAPREP W/TINT 26 (MISCELLANEOUS) ×3 IMPLANT
CLOTH BEACON ORANGE TIMEOUT ST (SAFETY) ×3 IMPLANT
COVER LIGHT HANDLE STERIS (MISCELLANEOUS) ×6 IMPLANT
DERMABOND ADVANCED (GAUZE/BANDAGES/DRESSINGS) ×1
DERMABOND ADVANCED .7 DNX12 (GAUZE/BANDAGES/DRESSINGS) ×2 IMPLANT
ELECT REM PT RETURN 9FT ADLT (ELECTROSURGICAL) ×2
ELECTRODE REM PT RTRN 9FT ADLT (ELECTROSURGICAL) ×2 IMPLANT
GLOVE SURG ENC MOIS LTX SZ6.5 (GLOVE) ×6 IMPLANT
GLOVE SURG LTX SZ6 (GLOVE) ×2 IMPLANT
GLOVE SURG UNDER POLY LF SZ7 (GLOVE) ×9 IMPLANT
GOWN STRL REUS W/TWL LRG LVL3 (GOWN DISPOSABLE) ×6 IMPLANT
KIT MARKER MARGIN INK (KITS) ×3 IMPLANT
KIT TURNOVER KIT A (KITS) ×3 IMPLANT
MANIFOLD NEPTUNE II (INSTRUMENTS) ×3 IMPLANT
MARKER SKIN DUAL TIP RULER LAB (MISCELLANEOUS) ×3 IMPLANT
NDL HYPO 18GX1.5 BLUNT FILL (NEEDLE) IMPLANT
NDL HYPO 25X1 1.5 SAFETY (NEEDLE) ×2 IMPLANT
NEEDLE HYPO 18GX1.5 BLUNT FILL (NEEDLE) ×2 IMPLANT
NEEDLE HYPO 25X1 1.5 SAFETY (NEEDLE) ×2 IMPLANT
NS IRRIG 1000ML POUR BTL (IV SOLUTION) ×3 IMPLANT
PACK MINOR (CUSTOM PROCEDURE TRAY) ×3 IMPLANT
PAD ARMBOARD 7.5X6 YLW CONV (MISCELLANEOUS) ×3 IMPLANT
SET BASIN LINEN APH (SET/KITS/TRAYS/PACK) ×3 IMPLANT
SET LOCALIZER 20 PROBE US (MISCELLANEOUS) ×3 IMPLANT
SUT MNCRL AB 4-0 PS2 18 (SUTURE) ×3 IMPLANT
SUT VIC AB 3-0 SH 27 (SUTURE) ×4
SUT VIC AB 3-0 SH 27X BRD (SUTURE) ×2 IMPLANT
SYR 50ML LL SCALE MARK (SYRINGE) ×1 IMPLANT
SYR CONTROL 10ML LL (SYRINGE) ×3 IMPLANT

## 2021-11-01 NOTE — Discharge Instructions (Signed)
Ambulatory Surgery Discharge Instructions  General Anesthesia or Sedation Do not drive or operate heavy machinery for 24 hours.  Do not consume alcohol, tranquilizers, sleeping medications, or any non-prescribed medications for 24 hours. Do not make important decisions or sign any important papers in the next 24 hours. You should have someone with you tonight at home.  Activity  You are advised to go directly home from the hospital.  Restrict your activities and rest for a day.  Resume light activity tomorrow. No heavy lifting over 10 lbs or strenuous exercise.  Fluids and Diet Begin with clear liquids, bouillon, dry toast, soda crackers.  If not nauseated, you may go to a regular diet when you desire.  Greasy and spicy foods are not advised.  Medications  If you have not had a bowel movement in 24 hours, take 2 tablespoons over the counter Milk of mag.             You May resume your blood thinners tomorrow (Aspirin, coumadin, or other).  You are being discharged with prescriptions for Opioid/Narcotic Medications: There are some specific considerations for these medications that you should know. Opioid Meds have risks & benefits. Addiction to these meds is always a concern with prolonged use Take medication only as directed Do not drive while taking narcotic pain medication Do not crush tablets or capsules Do not use a different container than medication was dispensed in Lock the container of medication in a cool, dry place out of reach of children and pets. Opioid medication can cause addiction Do not share with anyone else (this is a felony) Do not store medications for future use. Dispose of them properly.     Disposal:  Find a [employer] household drug take back site near you.  If you can't get to a drug take back site, use the recipe below as a last resort to dispose of expired, unused or unwanted drugs. Disposal  (Do not dispose chemotherapy drugs this way, talk to your  prescribing doctor instead.) Step 1: Mix drugs (do not crush) with dirt, kitty litter, or used coffee grounds and add a small amount of water to dissolve any solid medications. Step 2: Seal drugs in plastic bag. Step 3: Place plastic bag in trash. Step 4: Take prescription container and scratch out personal information, then recycle or throw away.  Operative Site  You have a liquid bandage over your incisions, this will begin to flake off in about a week. You also have a pad and chest binder in place.  Wear the chest binder or sports bra for support.  Ok to [employer] developer. Keep wound clean and dry. No baths or swimming. No lifting more than 10 pounds.   Contact Information: If you have questions or concerns, please call our office, [phone], Monday- Thursday 8AM-5PM and Friday 8AM-12Noon.  If it is after hours or on the weekend, please call [facility], [phone], and ask to speak to the surgeon on call for Dr. Okey Dupre at [facility].   SPECIFIC COMPLICATIONS TO WATCH FOR: Inability to urinate Fever over 101? F by mouth Nausea and vomiting lasting longer than 24 hours. Pain not relieved by medication ordered Swelling around the operative site Increased redness, warmth, hardness, around operative area Numbness, tingling, or cold fingers or toes Blood -soaked dressing, (small amounts of oozing may be normal) Increasing and progressive drainage from surgical area or exam site

## 2021-11-01 NOTE — Transfer of Care (Signed)
Immediate Anesthesia Transfer of Care Note  Patient: Tricia Jenkins  Procedure(s) Performed: EXCISION OF BREAST BIOPSY (Left: Breast) BREAST LUMPECTOMY WITH RADIOFREQUENCY TAG IDENTIFICATION (Left: Breast)  Patient Location: PACU  Anesthesia Type:General  Level of Consciousness: awake, alert  and oriented  Airway & Oxygen Therapy: Patient Spontanous Breathing and Patient connected to nasal cannula oxygen  Post-op Assessment: Report given to RN, Post -op Vital signs reviewed and stable, Patient moving all extremities X 4 and Patient able to stick tongue midline  Post vital signs: Reviewed  Last Vitals:  Vitals Value Taken Time  BP 134/58   Temp 97.5   Pulse 71 11/01/21 1001  Resp 13 11/01/21 1001  SpO2 99 % 11/01/21 1001  Vitals shown include unvalidated device data.  Last Pain:  Vitals:   11/01/21 0653  PainSc: 0-No pain         Complications: No notable events documented.

## 2021-11-01 NOTE — Op Note (Signed)
Operative Note   Preoperative Diagnosis: Complex sclerosing lesion of the left breast x2   Postoperative Diagnosis: Same   Procedure(s) Performed: Excisional biopsy of left breast mass localized via radiofrequency tag x2   Surgeon: Graciella Freer, DO    Assistants: None   Anesthesia: LMA   Anesthesiologist: Lucas Mallow, MD  Specimens: Left upper inner breast excisional biopsy (containing radiofrequency tag, no biopsy tag); Left lower outer breast excisional biopsy   Estimated Blood Loss: Minimal    Blood Replacement: None    Complications: None    Operative Findings: 2 breast masses removed containing radiofrequency tags  Indications:  Patient is an 81 year old female who presents for radiofrequency tag guided excisional biopsy of her left breast x2.  She underwent a mammogram and subsequent ultrasound-guided biopsy of 2 left breast areas.  Both were noted to be complex sclerosing lesions, but were recommended to be excisional only biopsied.   All risks and benefits of performing this procedure were discussed with the patient including pain, infection, bleeding, damage to the surrounding structures, and need for more procedures or surgery. The patient voiced understanding of the procedure, all questions were sought and answered, and consent was obtained.   Procedure:  Preoperative placement of radiofrequency tag was performed by radiology on 2/7. Localization studies were reviewed. The patient was taken to the operative room and placed in the supine position on the operating table. LMA anesthesia was induced. Intravenous antibiotics were administered per protocol.  The marked breast was cleaned prepped and draped in the usual sterile fashion.   Attention was first turned to the left upper inner quadrant mass. The Faxitron probe was used to help determine the best location for incision, and a curvilinear incision was made and flaps were raised.  The Faxitron probe was placed  within the depths of the wounds and upon dissection, the clip was identified.  A ball of tissue was taken surrounding this area.  The probe was then used to demonstrate that the clip was removed with the specimen.  The specimen was oriented using our painting system. This specimen was first sent to radiology to verify the radiofrequency clip was noted in the specimen.  The specimen was then sent to pathology for evaluation.   Attention was then turned to the left lower outer quadrant mass. The Faxitron probe was again used to help determine the best location for incision, and a curvilinear incision was made and flaps were raised.  The Faxitron probe was placed within the depths of the wounds and upon dissection, the clip was identified.  A ball of tissue was taken surrounding this area.  The probe was then used to demonstrate that the clip was removed with the specimen.  The specimen was oriented using our painting system. This specimen was first sent to radiology to verify the radiofrequency clip was noted in the specimen.  The specimen was then sent to pathology for evaluation.  Hemostasis of both incisions was obtained using electrocautery.  The wounds were closed with interrupted Vicryl in the superficial dermis, and a 4-0 Monocryl subcuticular sutures. Dermabond was applied to both incisions.  They were covered with an ABD pad, and a breast/chest binder was placed.  The sponge and instrument counts were noted to be correct.  The patient tolerated the procedure well and was transferred to the recovery room in stable posterior to condition.     Graciella Freer, DO  [facility] [address] [place], [place] [address] [phone] (  office)

## 2021-11-01 NOTE — Anesthesia Procedure Notes (Signed)
Procedure Name: LMA Insertion Date/Time: 11/01/2021 7:39 AM Performed by: Maude Leriche, CRNA Pre-anesthesia Checklist: Patient identified, Emergency Drugs available, Suction available, Patient being monitored and Timeout performed Patient Re-evaluated:Patient Re-evaluated prior to induction Oxygen Delivery Method: Circle system utilized Preoxygenation: Pre-oxygenation with 100% oxygen Induction Type: IV induction LMA: LMA inserted LMA Size: 4.0 Number of attempts: 1 Tube secured with: Tape Dental Injury: Teeth and Oropharynx as per pre-operative assessment

## 2021-11-01 NOTE — Anesthesia Postprocedure Evaluation (Signed)
Anesthesia Post Note  Patient: TANZIE ROTHSCHILD  Procedure(s) Performed: EXCISION OF BREAST BIOPSY (Left: Breast) BREAST LUMPECTOMY WITH RADIOFREQUENCY TAG IDENTIFICATION (Left: Breast)  Patient location during evaluation: Phase II Anesthesia Type: General Level of consciousness: awake Pain management: pain level controlled Vital Signs Assessment: post-procedure vital signs reviewed and stable Respiratory status: spontaneous breathing and respiratory function stable Cardiovascular status: blood pressure returned to baseline and stable Postop Assessment: no headache and no apparent nausea or vomiting Anesthetic complications: no Comments: Late entry   No notable events documented.   Last Vitals:  Vitals:   11/01/21 1038 11/01/21 1055  BP: (!) 118/53 96/61  Pulse: 66 60  Resp: 11 16  Temp:  (!) 36.4 C  SpO2: 93% 93%    Last Pain:  Vitals:   11/01/21 1055  TempSrc: Oral  PainSc: [place]

## 2021-11-01 NOTE — Interval H&P Note (Signed)
History and Physical Interval Note:  11/01/2021 7:28 AM  Tricia Jenkins  has presented today for surgery, with the diagnosis of complex sclerosisng lesions of L breast x2.  The various methods of treatment have been discussed with the patient and family. After consideration of risks, benefits and other options for treatment, the patient has consented to  Procedure(s) with comments: EXCISION OF BREAST BIOPSY (Left) - pt knows to arrive at 6:15 BREAST LUMPECTOMY WITH RADIOFREQUENCY TAG IDENTIFICATION (Left) as a surgical intervention.  The patient's history has been reviewed, patient examined, no change in status, stable for surgery.  I have reviewed the patient's chart and labs.  Questions were answered to the patient's satisfaction.     [place]

## 2021-11-01 NOTE — Anesthesia Preprocedure Evaluation (Addendum)
Anesthesia Evaluation  Patient identified by MRN, date of birth, ID band Patient awake    Reviewed: Allergy & Precautions, H&P , NPO status , Patient's Chart, lab work & pertinent test results, reviewed documented beta blocker date and time   Airway Mallampati: II  TM Distance: >3 FB Neck ROM: full    Dental  (+) Edentulous Upper   Pulmonary neg pulmonary ROS, former smoker,    Pulmonary exam normal breath sounds clear to auscultation       Cardiovascular Exercise Tolerance: Good hypertension, negative cardio ROS   Rhythm:regular Rate:Normal     Neuro/Psych negative neurological ROS  negative psych ROS   GI/Hepatic Neg liver ROS, PUD, GERD  Medicated,  Endo/Other  negative endocrine ROS  Renal/GU negative Renal ROS  negative genitourinary   Musculoskeletal   Abdominal   Peds  Hematology negative hematology ROS (+)   Anesthesia Other Findings   Reproductive/Obstetrics negative OB ROS                            Anesthesia Physical Anesthesia Plan  ASA: 2  Anesthesia Plan: General and General LMA   Post-op Pain Management:    Induction:   PONV Risk Score and Plan: Ondansetron  Airway Management Planned:   Additional Equipment:   Intra-op Plan:   Post-operative Plan:   Informed Consent: I have reviewed the patients History and Physical, chart, labs and discussed the procedure including the risks, benefits and alternatives for the proposed anesthesia with the patient or authorized representative who has indicated his/her understanding and acceptance.     Dental Advisory Given  Plan Discussed with: CRNA  Anesthesia Plan Comments:         Anesthesia Quick Evaluation

## 2021-11-04 ENCOUNTER — Encounter (HOSPITAL_COMMUNITY): Payer: Self-pay | Admitting: Surgery

## 2021-11-04 LAB — SURGICAL PATHOLOGY

## 2021-11-05 ENCOUNTER — Telehealth (INDEPENDENT_AMBULATORY_CARE_PROVIDER_SITE_OTHER): Payer: Medicare Other | Admitting: Surgery

## 2021-11-05 DIAGNOSIS — N6489 Other specified disorders of breast: Secondary | ICD-10-CM

## 2021-11-05 NOTE — Telephone Encounter (Signed)
Called to update the patient on her pathology results from her left breast excisional biopsy x2 on 11/01/2021.  It was explained that there was no evidence of malignancy in either specimen.  It was further explained that I will plan to call her on 2/23 for her phone follow-up.  All questions were answered to her expressed satisfaction.  FINAL MICROSCOPIC DIAGNOSIS:   A. BREAST, LEFT, BIOPSY:  - Complex sclerosing lesion with usual ductal hyperplasia and  calcifications.  - Fibrocystic changes with columnar cell hyperplasia and calcifications.  - No malignancy identified.  - Biopsy site changes.   B. BREAST, LEFT, BIOPSY:  - Complex sclerosing lesion with usual ductal hyperplasia and  calcifications.  - Fibrocystic changes with columnar cell hyperplasia and calcifications.  - No malignancy identified.  - Biopsy site changes.    Graciella Freer, DO [facility] [address] [place], [place] [address] [phone] (office)

## 2021-11-14 ENCOUNTER — Ambulatory Visit (INDEPENDENT_AMBULATORY_CARE_PROVIDER_SITE_OTHER): Payer: Medicare Other | Admitting: Surgery

## 2021-11-14 DIAGNOSIS — Z09 Encounter for follow-up examination after completed treatment for conditions other than malignant neoplasm: Secondary | ICD-10-CM

## 2021-11-15 NOTE — Progress Notes (Signed)
[facility]  I am calling the patient for post operative evaluation. This is not a billable encounter as it is under the [facility] charges for the surgery.  The patient had an excisional biopsy of left breast masses localized with radiofrequency tag x2 on 2/10. The patient reports that she is doing well since the surgery.  She states that her incision sites are 'ugly,' but upon further questioning, she denies drainage, erythema, and ecchymosis at the incision sites.  She still has some skin glue over the incisions, and it was explained that she can peel off the remainder of the skin glue and used antibiotic ointment to help remove the remaining glue. She is tolerating a diet, having good pain control, and having regular Bms.  The patient has no concerns at this time.  She was advised to follow up with Dr. Willey Blade as normal, and that she will go back to her normal annual mammogram schedule.  All questions were answered to her expressed satisfaction.  Pathology: FINAL MICROSCOPIC DIAGNOSIS:   A. BREAST, LEFT, BIOPSY:  - Complex sclerosing lesion with usual ductal hyperplasia and  calcifications.  - Fibrocystic changes with columnar cell hyperplasia and calcifications.  - No malignancy identified.  - Biopsy site changes.   B. BREAST, LEFT, BIOPSY:  - Complex sclerosing lesion with usual ductal hyperplasia and  calcifications.  - Fibrocystic changes with columnar cell hyperplasia and calcifications.  - No malignancy identified.  - Biopsy site changes.   Will see the patient PRN.   Graciella Freer, DO [facility] [address] [place], [place] [address] [phone] (office)

## 2021-11-19 DIAGNOSIS — I10 Essential (primary) hypertension: Secondary | ICD-10-CM | POA: Diagnosis not present

## 2021-11-19 DIAGNOSIS — K219 Gastro-esophageal reflux disease without esophagitis: Secondary | ICD-10-CM | POA: Diagnosis not present

## 2021-12-04 ENCOUNTER — Other Ambulatory Visit: Payer: Self-pay

## 2021-12-04 ENCOUNTER — Emergency Department (HOSPITAL_COMMUNITY): Payer: Medicare Other

## 2021-12-04 ENCOUNTER — Inpatient Hospital Stay (HOSPITAL_COMMUNITY)
Admission: EM | Admit: 2021-12-04 | Discharge: 2021-12-08 | DRG: 690 | Disposition: A | Discharge status: Home / Self Care | Payer: Medicare Other | Attending: Family Medicine | Admitting: Family Medicine

## 2021-12-04 ENCOUNTER — Encounter (HOSPITAL_COMMUNITY): Payer: Self-pay

## 2021-12-04 DIAGNOSIS — R109 Unspecified abdominal pain: Secondary | ICD-10-CM | POA: Diagnosis not present

## 2021-12-04 DIAGNOSIS — K219 Gastro-esophageal reflux disease without esophagitis: Secondary | ICD-10-CM | POA: Diagnosis present

## 2021-12-04 DIAGNOSIS — I129 Hypertensive chronic kidney disease with stage 1 through stage 4 chronic kidney disease, or unspecified chronic kidney disease: Secondary | ICD-10-CM | POA: Diagnosis present

## 2021-12-04 DIAGNOSIS — Z9049 Acquired absence of other specified parts of digestive tract: Secondary | ICD-10-CM

## 2021-12-04 DIAGNOSIS — Z8719 Personal history of other diseases of the digestive system: Secondary | ICD-10-CM

## 2021-12-04 DIAGNOSIS — N189 Chronic kidney disease, unspecified: Secondary | ICD-10-CM | POA: Diagnosis not present

## 2021-12-04 DIAGNOSIS — N1832 Chronic kidney disease, stage 3b: Secondary | ICD-10-CM | POA: Diagnosis present

## 2021-12-04 DIAGNOSIS — R1084 Generalized abdominal pain: Principal | ICD-10-CM | POA: Diagnosis present

## 2021-12-04 DIAGNOSIS — E876 Hypokalemia: Secondary | ICD-10-CM | POA: Diagnosis not present

## 2021-12-04 DIAGNOSIS — E878 Other disorders of electrolyte and fluid balance, not elsewhere classified: Secondary | ICD-10-CM | POA: Diagnosis present

## 2021-12-04 DIAGNOSIS — Z886 Allergy status to analgesic agent status: Secondary | ICD-10-CM

## 2021-12-04 DIAGNOSIS — N10 Acute tubulo-interstitial nephritis: Secondary | ICD-10-CM

## 2021-12-04 DIAGNOSIS — Z87891 Personal history of nicotine dependence: Secondary | ICD-10-CM | POA: Diagnosis not present

## 2021-12-04 DIAGNOSIS — A4150 Gram-negative sepsis, unspecified: Secondary | ICD-10-CM | POA: Diagnosis not present

## 2021-12-04 DIAGNOSIS — I10 Essential (primary) hypertension: Secondary | ICD-10-CM | POA: Diagnosis not present

## 2021-12-04 DIAGNOSIS — Z20822 Contact with and (suspected) exposure to covid-19: Secondary | ICD-10-CM | POA: Diagnosis present

## 2021-12-04 DIAGNOSIS — N12 Tubulo-interstitial nephritis, not specified as acute or chronic: Secondary | ICD-10-CM | POA: Diagnosis not present

## 2021-12-04 DIAGNOSIS — N390 Urinary tract infection, site not specified: Secondary | ICD-10-CM | POA: Diagnosis not present

## 2021-12-04 DIAGNOSIS — M1990 Unspecified osteoarthritis, unspecified site: Secondary | ICD-10-CM | POA: Diagnosis present

## 2021-12-04 DIAGNOSIS — Z8249 Family history of ischemic heart disease and other diseases of the circulatory system: Secondary | ICD-10-CM

## 2021-12-04 DIAGNOSIS — I700 Atherosclerosis of aorta: Secondary | ICD-10-CM | POA: Diagnosis not present

## 2021-12-04 DIAGNOSIS — Z87442 Personal history of urinary calculi: Secondary | ICD-10-CM

## 2021-12-04 DIAGNOSIS — Z79899 Other long term (current) drug therapy: Secondary | ICD-10-CM | POA: Diagnosis not present

## 2021-12-04 DIAGNOSIS — E871 Hypo-osmolality and hyponatremia: Secondary | ICD-10-CM | POA: Diagnosis present

## 2021-12-04 DIAGNOSIS — N179 Acute kidney failure, unspecified: Secondary | ICD-10-CM | POA: Diagnosis not present

## 2021-12-04 LAB — COMPREHENSIVE METABOLIC PANEL
ALT: 21 U/L (ref 0–44)
AST: 39 U/L (ref 15–41)
Albumin: 3.2 g/dL — ABNORMAL LOW (ref 3.5–5.0)
Alkaline Phosphatase: 96 U/L (ref 38–126)
Anion gap: 14 (ref 5–15)
BUN: 39 mg/dL — ABNORMAL HIGH (ref 8–23)
CO2: 24 mmol/L (ref 22–32)
Calcium: 7.7 mg/dL — ABNORMAL LOW (ref 8.9–10.3)
Chloride: 96 mmol/L — ABNORMAL LOW (ref 98–111)
Creatinine, Ser: 2.87 mg/dL — ABNORMAL HIGH (ref 0.44–1.00)
GFR, Estimated: 16 mL/min — ABNORMAL LOW (ref >60)
Glucose, Bld: 128 mg/dL — ABNORMAL HIGH (ref 70–99)
Potassium: 3.8 mmol/L (ref 3.5–5.1)
Sodium: 134 mmol/L — ABNORMAL LOW (ref 135–145)
Total Bilirubin: 1 mg/dL (ref 0.3–1.2)
Total Protein: 7 g/dL (ref 6.5–8.1)

## 2021-12-04 LAB — CBC
HCT: 36.7 % (ref 36.0–46.0)
Hemoglobin: 12 g/dL (ref 12.0–15.0)
MCH: 28.8 pg (ref 26.0–34.0)
MCHC: 32.7 g/dL (ref 30.0–36.0)
MCV: 88 fL (ref 80.0–100.0)
Platelets: 135 10*3/uL — ABNORMAL LOW (ref 150–400)
RBC: 4.17 MIL/uL (ref 3.87–5.11)
RDW: 14.3 % (ref 11.5–15.5)
WBC: 14.8 10*3/uL — ABNORMAL HIGH (ref 4.0–10.5)
nRBC: 0 % (ref 0.0–0.2)

## 2021-12-04 LAB — LIPASE, BLOOD: Lipase: 23 U/L (ref 11–51)

## 2021-12-04 LAB — RESP PANEL BY RT-PCR (FLU A&B, COVID) ARPGX2
Influenza A by PCR: NEGATIVE
Influenza B by PCR: NEGATIVE
SARS Coronavirus 2 by RT PCR: NEGATIVE

## 2021-12-04 MED ORDER — SODIUM CHLORIDE 0.9 % IV BOLUS
1000.0000 mL | Freq: Once | INTRAVENOUS | Status: AC
  Administered 2021-12-04: 1000 mL via INTRAVENOUS

## 2021-12-04 NOTE — ED Triage Notes (Signed)
Pt presents to ED with lower abdominal pain, intermittent and nausea since Saturday.  ?

## 2021-12-05 ENCOUNTER — Other Ambulatory Visit: Payer: Self-pay

## 2021-12-05 DIAGNOSIS — I10 Essential (primary) hypertension: Secondary | ICD-10-CM

## 2021-12-05 DIAGNOSIS — A4150 Gram-negative sepsis, unspecified: Secondary | ICD-10-CM | POA: Diagnosis present

## 2021-12-05 DIAGNOSIS — R109 Unspecified abdominal pain: Secondary | ICD-10-CM | POA: Diagnosis not present

## 2021-12-05 DIAGNOSIS — K219 Gastro-esophageal reflux disease without esophagitis: Secondary | ICD-10-CM

## 2021-12-05 DIAGNOSIS — R1084 Generalized abdominal pain: Secondary | ICD-10-CM

## 2021-12-05 DIAGNOSIS — Z87442 Personal history of urinary calculi: Secondary | ICD-10-CM | POA: Diagnosis not present

## 2021-12-05 DIAGNOSIS — N12 Tubulo-interstitial nephritis, not specified as acute or chronic: Principal | ICD-10-CM

## 2021-12-05 DIAGNOSIS — N1832 Chronic kidney disease, stage 3b: Secondary | ICD-10-CM | POA: Diagnosis present

## 2021-12-05 DIAGNOSIS — I129 Hypertensive chronic kidney disease with stage 1 through stage 4 chronic kidney disease, or unspecified chronic kidney disease: Secondary | ICD-10-CM | POA: Diagnosis present

## 2021-12-05 DIAGNOSIS — N179 Acute kidney failure, unspecified: Secondary | ICD-10-CM | POA: Diagnosis present

## 2021-12-05 DIAGNOSIS — Z8249 Family history of ischemic heart disease and other diseases of the circulatory system: Secondary | ICD-10-CM | POA: Diagnosis not present

## 2021-12-05 DIAGNOSIS — E876 Hypokalemia: Secondary | ICD-10-CM | POA: Diagnosis present

## 2021-12-05 DIAGNOSIS — N390 Urinary tract infection, site not specified: Secondary | ICD-10-CM

## 2021-12-05 DIAGNOSIS — M1990 Unspecified osteoarthritis, unspecified site: Secondary | ICD-10-CM | POA: Diagnosis present

## 2021-12-05 DIAGNOSIS — Z79899 Other long term (current) drug therapy: Secondary | ICD-10-CM | POA: Diagnosis not present

## 2021-12-05 DIAGNOSIS — E878 Other disorders of electrolyte and fluid balance, not elsewhere classified: Secondary | ICD-10-CM | POA: Diagnosis present

## 2021-12-05 DIAGNOSIS — Z8719 Personal history of other diseases of the digestive system: Secondary | ICD-10-CM | POA: Diagnosis not present

## 2021-12-05 DIAGNOSIS — E871 Hypo-osmolality and hyponatremia: Secondary | ICD-10-CM | POA: Diagnosis present

## 2021-12-05 DIAGNOSIS — Z20822 Contact with and (suspected) exposure to covid-19: Secondary | ICD-10-CM | POA: Diagnosis present

## 2021-12-05 DIAGNOSIS — Z9049 Acquired absence of other specified parts of digestive tract: Secondary | ICD-10-CM | POA: Diagnosis not present

## 2021-12-05 DIAGNOSIS — Z886 Allergy status to analgesic agent status: Secondary | ICD-10-CM | POA: Diagnosis not present

## 2021-12-05 DIAGNOSIS — Z87891 Personal history of nicotine dependence: Secondary | ICD-10-CM | POA: Diagnosis not present

## 2021-12-05 DIAGNOSIS — N189 Chronic kidney disease, unspecified: Secondary | ICD-10-CM | POA: Diagnosis not present

## 2021-12-05 LAB — URINALYSIS, ROUTINE W REFLEX MICROSCOPIC
Bilirubin Urine: NEGATIVE
Glucose, UA: NEGATIVE mg/dL
Ketones, ur: 5 mg/dL — AB
Nitrite: NEGATIVE
Protein, ur: 100 mg/dL — AB
Specific Gravity, Urine: 1.015 (ref 1.005–1.030)
WBC, UA: >50 WBC/hpf — ABNORMAL HIGH (ref 0–5)
pH: 5 (ref 5.0–8.0)

## 2021-12-05 LAB — BASIC METABOLIC PANEL
Anion gap: 11 (ref 5–15)
BUN: 39 mg/dL — ABNORMAL HIGH (ref 8–23)
CO2: 22 mmol/L (ref 22–32)
Calcium: 6.8 mg/dL — ABNORMAL LOW (ref 8.9–10.3)
Chloride: 99 mmol/L (ref 98–111)
Creatinine, Ser: 2.76 mg/dL — ABNORMAL HIGH (ref 0.44–1.00)
GFR, Estimated: 17 mL/min — ABNORMAL LOW (ref >60)
Glucose, Bld: 94 mg/dL (ref 70–99)
Potassium: 3.8 mmol/L (ref 3.5–5.1)
Sodium: 132 mmol/L — ABNORMAL LOW (ref 135–145)

## 2021-12-05 LAB — PROTIME-INR
INR: 1.1 (ref 0.8–1.2)
Prothrombin Time: 14.5 seconds (ref 11.4–15.2)

## 2021-12-05 LAB — CBC
HCT: 30.3 % — ABNORMAL LOW (ref 36.0–46.0)
Hemoglobin: 9.9 g/dL — ABNORMAL LOW (ref 12.0–15.0)
MCH: 29.1 pg (ref 26.0–34.0)
MCHC: 32.7 g/dL (ref 30.0–36.0)
MCV: 89.1 fL (ref 80.0–100.0)
Platelets: 120 10*3/uL — ABNORMAL LOW (ref 150–400)
RBC: 3.4 MIL/uL — ABNORMAL LOW (ref 3.87–5.11)
RDW: 14.5 % (ref 11.5–15.5)
WBC: 13.4 10*3/uL — ABNORMAL HIGH (ref 4.0–10.5)
nRBC: 0 % (ref 0.0–0.2)

## 2021-12-05 LAB — PROCALCITONIN: Procalcitonin: 8.16 ng/mL

## 2021-12-05 LAB — CORTISOL-AM, BLOOD: Cortisol - AM: 32.2 ug/dL — ABNORMAL HIGH (ref 6.7–22.6)

## 2021-12-05 MED ORDER — SODIUM CHLORIDE 0.9 % IV BOLUS (SEPSIS): 1000.0000 mL | Freq: Once | INTRAVENOUS | Status: DC

## 2021-12-05 MED ORDER — OXYCODONE HCL 5 MG PO TABS
5.0000 mg | ORAL_TABLET | Freq: Four times a day (QID) | ORAL | Status: DC | PRN
  Administered 2021-12-05 – 2021-12-08 (×5): 5 mg via ORAL
  Filled 2021-12-05 (×5): qty 1

## 2021-12-05 MED ORDER — SODIUM CHLORIDE 0.9 % IV SOLN: INTRAVENOUS | Status: DC

## 2021-12-05 MED ORDER — PANTOPRAZOLE SODIUM 40 MG PO TBEC
40.0000 mg | DELAYED_RELEASE_TABLET | Freq: Every day | ORAL | Status: DC
Start: 2021-12-05 — End: 2021-12-08
  Administered 2021-12-05 – 2021-12-08 (×4): 40 mg via ORAL
  Filled 2021-12-05 (×4): qty 1

## 2021-12-05 MED ORDER — ACETAMINOPHEN 325 MG PO TABS: 650.0000 mg | ORAL_TABLET | Freq: Four times a day (QID) | ORAL | Status: DC | PRN

## 2021-12-05 MED ORDER — ACETAMINOPHEN 650 MG RE SUPP: 650.0000 mg | Freq: Four times a day (QID) | RECTAL | Status: DC | PRN

## 2021-12-05 MED ORDER — GABAPENTIN 300 MG PO CAPS
300.0000 mg | ORAL_CAPSULE | Freq: Three times a day (TID) | ORAL | Status: DC
  Administered 2021-12-05 – 2021-12-06 (×6): 300 mg via ORAL
  Filled 2021-12-05 (×7): qty 1

## 2021-12-05 MED ORDER — SODIUM CHLORIDE 0.9 % IV SOLN
1.0000 g | Freq: Once | INTRAVENOUS | Status: AC
  Administered 2021-12-05: 1 g via INTRAVENOUS
  Filled 2021-12-05: qty 10

## 2021-12-05 MED ORDER — SODIUM CHLORIDE 0.9 % IV BOLUS (SEPSIS)
1000.0000 mL | Freq: Once | INTRAVENOUS | Status: AC
  Administered 2021-12-05: 1000 mL via INTRAVENOUS

## 2021-12-05 MED ORDER — MAGNESIUM HYDROXIDE 400 MG/5ML PO SUSP: 30.0000 mL | Freq: Every day | ORAL | Status: DC | PRN

## 2021-12-05 MED ORDER — ONDANSETRON HCL 4 MG/2ML IJ SOLN: 4.0000 mg | Freq: Four times a day (QID) | INTRAMUSCULAR | Status: DC | PRN

## 2021-12-05 MED ORDER — ONDANSETRON HCL 4 MG PO TABS: 4.0000 mg | ORAL_TABLET | Freq: Four times a day (QID) | ORAL | Status: DC | PRN

## 2021-12-05 MED ORDER — AMLODIPINE BESYLATE 5 MG PO TABS
5.0000 mg | ORAL_TABLET | Freq: Every day | ORAL | Status: DC
  Administered 2021-12-05 – 2021-12-08 (×4): 5 mg via ORAL
  Filled 2021-12-05 (×4): qty 1

## 2021-12-05 MED ORDER — TRAZODONE HCL 50 MG PO TABS: 25.0000 mg | ORAL_TABLET | Freq: Every evening | ORAL | Status: DC | PRN

## 2021-12-05 MED ORDER — ENOXAPARIN SODIUM 30 MG/0.3ML IJ SOSY
30.0000 mg | PREFILLED_SYRINGE | INTRAMUSCULAR | Status: DC
  Administered 2021-12-05 – 2021-12-08 (×4): 30 mg via SUBCUTANEOUS
  Filled 2021-12-05 (×4): qty 0.3

## 2021-12-05 MED ORDER — SODIUM CHLORIDE 0.9 % IV SOLN
2.0000 g | INTRAVENOUS | Status: DC
  Administered 2021-12-05 – 2021-12-08 (×4): 2 g via INTRAVENOUS
  Filled 2021-12-05 (×4): qty 20

## 2021-12-05 NOTE — ED Notes (Signed)
Pt placed on 2l nasal cannula as she decreased to 88-89% on room air while sleeping, SpO2 remains 95-96% on 2 L. Pt denies at home O2 use and Cpap use. Former smoker, denies COPD hx.  ?

## 2021-12-05 NOTE — Assessment & Plan Note (Addendum)
-   treated with PPI therapy. ?

## 2021-12-05 NOTE — Progress Notes (Addendum)
ASSUMPTION OF CARE NOTE  ? ?12/05/2021 ?5:29 PM ? ?Tricia Jenkins was seen and examined.  The H&P by the admitting provider, orders, imaging was reviewed.  Please see new orders.  Will continue to follow.  ? ? 81 y.o. Caucasian female with medical history significant for GERD, hypertension and osteoarthritis with as CKD 3b, who presented to the emergency room with acute onset of lower abdominal pain as well as of left flank pain with diminished p.o. intake and associated nausea.  She has been having dysuria and difficulty urinating.  She admits to mild cough without wheezing or dyspnea.  She denies any fever or chills.  No chest pain or dyspnea or palpitations. ?  ?ED Course: When the patient came to the ER, heart rate was 94 vital signs were within normal.  Labs revealed mild hyponatremia and hypochloremia with a BUN of 39 and creatinine of 2.87 above previous levels.  CBC showed leukocytosis of 14.8 and platelets of 135.  Influenza antigens and COVID-19 PCR came back negative.  UA was positive for UTI. ?  ?Imaging: Abdominal and pelvic CT scan showed mild distention of the left renal pelvis with left ureter and surrounding fat stranding, findings that could reflect recent passage of urinary tract calculus or underlying infection/pyelonephritis.  It showed aortic atherosclerosis and no radiopaque calculi. ? ?The patient was given a gram of IV Rocephin and 1 L bolus of IV normal saline.  She will be admitted to a telemetry bed for further evaluation and management. ? ?12/05/2021 ? ?Assessment and Plan: ?* Pyelonephritis of left kidney ?- The patient will be admitted to a medical telemetry bed. ?- We will continue hydration with IV normal saline. ?- We will continue antibiotic therapy with IV Rocephin. ?- We will follow blood and urine cultures. ? ?Acute kidney injury superimposed on chronic kidney disease ([place]) ?- The patient will be hydrated with IV normal saline. ?- We will follow BMP. ?- We will avoid  nephrotoxins. ? ?Sepsis ruled out  ?- The patient will be placed on hydration with IV normal saline. ?- We will continue antibiotic therapy with IV Rocephin as mentioned above. ?- We will follow blood and urine cultures. ? ?GERD without esophagitis ?- We will continue PPI therapy. ? ?Essential hypertension ?- We will continue Norvasc and hold off HCTZ given acute kidney injury. ? ? ? ?Vitals:  ? 12/05/21 0932 12/05/21 1320  ?BP: 110/72 (!) 127/54  ?Pulse:  91  ?Resp:  20  ?Temp:  99.1 ?F (37.3 ?C)  ?SpO2:  95%  ? ? ?Results for orders placed or performed during the hospital encounter of 12/04/21  ?Resp Panel by RT-PCR (Flu A&B, Covid) Nasopharyngeal Swab  ? Specimen: Nasopharyngeal Swab; Nasopharyngeal(NP) swabs in vial transport medium  ?Result Value Ref Range  ? SARS Coronavirus 2 by RT PCR NEGATIVE NEGATIVE  ? Influenza A by PCR NEGATIVE NEGATIVE  ? Influenza B by PCR NEGATIVE NEGATIVE  ?Lipase, blood  ?Result Value Ref Range  ? Lipase 23 11 - 51 U/L  ?Comprehensive metabolic panel  ?Result Value Ref Range  ? Sodium 134 (L) 135 - 145 mmol/L  ? Potassium 3.8 3.5 - 5.1 mmol/L  ? Chloride 96 (L) 98 - 111 mmol/L  ? CO2 24 22 - 32 mmol/L  ? Glucose, Bld 128 (H) 70 - 99 mg/dL  ? BUN 39 (H) 8 - 23 mg/dL  ? Creatinine, Ser 2.87 (H) 0.44 - 1.00 mg/dL  ? Calcium 7.7 (L) 8.9 - 10.3 mg/dL  ?  Total Protein 7.0 6.5 - 8.1 g/dL  ? Albumin 3.2 (L) 3.5 - 5.0 g/dL  ? AST 39 15 - 41 U/L  ? ALT 21 0 - 44 U/L  ? Alkaline Phosphatase 96 38 - 126 U/L  ? Total Bilirubin 1.0 0.3 - 1.2 mg/dL  ? GFR, Estimated 16 (L) >60 mL/min  ? Anion gap 14 5 - 15  ?CBC  ?Result Value Ref Range  ? WBC 14.8 (H) 4.0 - 10.5 K/uL  ? RBC 4.17 3.87 - 5.11 MIL/uL  ? Hemoglobin 12.0 12.0 - 15.0 g/dL  ? HCT 36.7 36.0 - 46.0 %  ? MCV 88.0 80.0 - 100.0 fL  ? MCH 28.8 26.0 - 34.0 pg  ? MCHC 32.7 30.0 - 36.0 g/dL  ? RDW 14.3 11.5 - 15.5 %  ? Platelets 135 (L) 150 - 400 K/uL  ? nRBC 0.0 0.0 - 0.2 %  ?Urinalysis, Routine w reflex microscopic Urine, In & Out Cath   ?Result Value Ref Range  ? Color, Urine YELLOW YELLOW  ? APPearance CLOUDY (A) CLEAR  ? Specific Gravity, Urine 1.015 1.005 - 1.030  ? pH 5.0 5.0 - 8.0  ? Glucose, UA NEGATIVE NEGATIVE mg/dL  ? Hgb urine dipstick MODERATE (A) NEGATIVE  ? Bilirubin Urine NEGATIVE NEGATIVE  ? Ketones, ur 5 (A) NEGATIVE mg/dL  ? Protein, ur 100 (A) NEGATIVE mg/dL  ? Nitrite NEGATIVE NEGATIVE  ? Leukocytes,Ua LARGE (A) NEGATIVE  ? RBC / HPF 0-5 0 - 5 RBC/hpf  ? WBC, UA >50 (H) 0 - 5 WBC/hpf  ? Bacteria, UA MANY (A) NONE SEEN  ? Squamous Epithelial / LPF 0-5 0 - 5  ? WBC Clumps PRESENT   ? Mucus PRESENT   ?Protime-INR  ?Result Value Ref Range  ? Prothrombin Time 14.5 11.4 - 15.2 seconds  ? INR 1.1 0.8 - 1.2  ?Cortisol-am, blood  ?Result Value Ref Range  ? Cortisol - AM 32.2 (H) 6.7 - 22.6 ug/dL  ?Procalcitonin  ?Result Value Ref Range  ? Procalcitonin 8.16 ng/mL  ?Basic metabolic panel  ?Result Value Ref Range  ? Sodium 132 (L) 135 - 145 mmol/L  ? Potassium 3.8 3.5 - 5.1 mmol/L  ? Chloride 99 98 - 111 mmol/L  ? CO2 22 22 - 32 mmol/L  ? Glucose, Bld 94 70 - 99 mg/dL  ? BUN 39 (H) 8 - 23 mg/dL  ? Creatinine, Ser 2.76 (H) 0.44 - 1.00 mg/dL  ? Calcium 6.8 (L) 8.9 - 10.3 mg/dL  ? GFR, Estimated 17 (L) >60 mL/min  ? Anion gap 11 5 - 15  ?CBC  ?Result Value Ref Range  ? WBC 13.4 (H) 4.0 - 10.5 K/uL  ? RBC 3.40 (L) 3.87 - 5.11 MIL/uL  ? Hemoglobin 9.9 (L) 12.0 - 15.0 g/dL  ? HCT 30.3 (L) 36.0 - 46.0 %  ? MCV 89.1 80.0 - 100.0 fL  ? MCH 29.1 26.0 - 34.0 pg  ? MCHC 32.7 30.0 - 36.0 g/dL  ? RDW 14.5 11.5 - 15.5 %  ? Platelets 120 (L) 150 - 400 K/uL  ? nRBC 0.0 0.0 - 0.2 %  ? ?Time spent: 34 mins  ? ? ?C. Wynetta Emery, MD ?[facility] ? ? 12/04/2021  8:24 PM ?How to contact the [facility] Attending or Consulting provider [place] or covering provider during after hours [place], for this patient?  ?Check the care team in [facility] and look for a) attending/consulting [facility] provider listed and b) the [place]  team listed ?Log into [email or web address] and use [place]  universal password to access. If you do not have the password, please contact the hospital operator. ?Locate the [facility] provider you are looking for under [facility] and page to a number that you can be directly reached. ?If you still have difficulty reaching the provider, please page the [facility] (Director on Call) for the Hospitalists listed on amion for assistance. ? ?

## 2021-12-05 NOTE — Progress Notes (Signed)
?  Transition of Care (TOC) Screening Note ? ? ?Patient Details  ?Name: Tricia Jenkins ?Date of Birth: 06-02-1941 ? ? ?Transition of Care (TOC) CM/SW Contact:    ?Boneta Lucks, RN ?Phone Number: ?12/05/2021, 11:30 AM ? ? ? ?Transition of Care Department [facility]) has reviewed patient and no TOC needs have been identified at this time. We will continue to monitor patient advancement through interdisciplinary progression rounds. If new patient transition needs arise, please place a TOC consult. ? ?Continue medical workup DC- 2 days ?

## 2021-12-05 NOTE — Assessment & Plan Note (Addendum)
-   The patient will be hydrated with IV normal saline with good results. ?- creatinine trending down nicely ? ?Lab Results  ?Component Value Date  ? CREATININE 1.82 (H) 12/08/2021  ? CREATININE 2.51 (H) 12/07/2021  ? CREATININE 2.73 (H) 12/06/2021  ? ?

## 2021-12-05 NOTE — ED Provider Notes (Signed)
?  Physical Exam  ?BP (!) 126/94   Pulse 82   Temp 98.7 ?F (37.1 ?C) (Oral)   Resp 19   Ht 5' 5.5" (1.664 m)   Wt 83 kg   SpO2 93%   BMI 29.99 kg/m?  ? ?Physical Exam ?Vitals and nursing note reviewed.  ?Constitutional:   ?   General: She is not in acute distress. ?   Appearance: She is well-developed. She is not diaphoretic.  ?HENT:  ?   Head: Normocephalic and atraumatic.  ?Cardiovascular:  ?   Rate and Rhythm: Normal rate and regular rhythm.  ?   Heart sounds: No murmur heard. ?  No friction rub. No gallop.  ?Pulmonary:  ?   Effort: Pulmonary effort is normal. No respiratory distress.  ?   Breath sounds: Normal breath sounds. No wheezing.  ?Abdominal:  ?   General: Bowel sounds are normal. There is no distension.  ?   Palpations: Abdomen is soft.  ?   Tenderness: There is no abdominal tenderness.  ?Musculoskeletal:     ?   General: Normal range of motion.  ?   Cervical back: Normal range of motion and neck supple.  ?Skin: ?   General: Skin is warm and dry.  ?Neurological:  ?   General: No focal deficit present.  ?   Mental Status: She is alert and oriented to person, place, and time.  ? ? ?Procedures  ?Procedures ? ?ED Course / MDM  ?Care assumed from Dr. Vanita Panda at shift change.  Patient awaiting results of urinalysis and CT scan.  Patient presenting here with back and abdominal pain and decreased appetite.  So far work-up reveals acute kidney injury with worsening creatinine. ? ?CT scan has resulted showing what appears to be left-sided pyelonephritis and urinalysis is consistent with UTI.  Patient given IV Rocephin and will be admitted to the hospitalist service for further care.  I have spoken with Dr. Sidney Ace who agrees to admit. ? ? ? ? ?  ?Veryl Speak, MD ?12/05/21 0215 ? ?

## 2021-12-05 NOTE — Assessment & Plan Note (Addendum)
-   Treated with supportive measures and treatments.  ?

## 2021-12-05 NOTE — Hospital Course (Addendum)
81 y.o. Caucasian female with medical history significant for GERD, hypertension and osteoarthritis with as CKD 3b, who presented to the emergency room with acute onset of lower abdominal pain as well as of left flank pain with diminished p.o. intake and associated nausea.  She has been having dysuria and difficulty urinating.  She admits to mild cough without wheezing or dyspnea.  She denies any fever or chills.  No chest pain or dyspnea or palpitations. ?  ?ED Course: When the patient came to the ER, heart rate was 94 vital signs were within normal.  Labs revealed mild hyponatremia and hypochloremia with a BUN of 39 and creatinine of 2.87 above previous levels.  CBC showed leukocytosis of 14.8 and platelets of 135.  Influenza antigens and COVID-19 PCR came back negative.  UA was positive for UTI. ?  ?Imaging: Abdominal and pelvic CT scan showed mild distention of the left renal pelvis with left ureter and surrounding fat stranding, findings that could reflect recent passage of urinary tract calculus or underlying infection/pyelonephritis.  It showed aortic atherosclerosis and no radiopaque calculi. ? ?The patient was given a gram of IV Rocephin and 1 L bolus of IV normal saline.  She will be admitted to a telemetry bed for further evaluation and management. ? ?12/06/2021: Patient reports that she continues to feel weak.  She still has some left flank pain as well.  She denies fever and chills.  Blood cultures no growth to date. ? ?12/07/2021:  Pt slowly starting to ambulate more. Flank pain improving.  No fever.  Starting to eat again. ? ?12/08/2021:  Pt reports that she is feeling a lot better and wants to go home.  She has no further flank pain, no fever, no chills and tolerating diet well.     ?

## 2021-12-05 NOTE — Assessment & Plan Note (Addendum)
-   Treated with IV normal saline. ?- Treated with IV Rocephin. ?- We will follow blood and urine cultures. No growth to date.  ?

## 2021-12-05 NOTE — Plan of Care (Signed)

## 2021-12-05 NOTE — H&P (Signed)
?  ?  ?[place] ? ? ?PATIENT NAME: Tricia Jenkins   ? ?MR#:  [id number] ? ?DATE OF BIRTH:  [birth year] ? ?DATE OF ADMISSION:  12/04/2021 ? ?PRIMARY CARE PHYSICIAN: Asencion Noble, MD  ? ?Patient is coming from: Home ? ?REQUESTING/REFERRING PHYSICIAN: Veryl Speak, MD ?CHIEF COMPLAINT:  ? ?Chief Complaint  ?Patient presents with  ? Abdominal Pain  ? ? ?HISTORY OF PRESENT ILLNESS:  ?Tricia Jenkins is a 81 y.o. Caucasian female with medical history significant for GERD, hypertension and osteoarthritis with as CKD 3b, who presented to the emergency room with acute onset of lower abdominal pain as well as of left flank pain with diminished p.o. intake and associated nausea.  She has been having dysuria and difficulty urinating.  She admits to mild cough without wheezing or dyspnea.  She denies any fever or chills.  No chest pain or dyspnea or palpitations. ? ?ED Course: When the patient came to the ER, heart rate was 94 vital signs were within normal.  Labs revealed mild hyponatremia and hypochloremia with a BUN of 39 and creatinine of 2.87 above previous levels.  CBC showed leukocytosis of 14.8 and platelets of 135.  Influenza antigens and COVID-19 PCR came back negative.  UA was positive for UTI. ? ?Imaging: Abdominal and pelvic CT scan showed mild distention of the left renal pelvis with left ureter and surrounding fat stranding, findings that could reflect recent passage of urinary tract calculus or underlying infection/pyelonephritis.  It showed aortic atherosclerosis and no radiopaque calculi. ? ?The patient was given a gram of IV Rocephin and 1 L bolus of IV normal saline.  She will be admitted to a telemetry bed for further evaluation and management. ?PAST MEDICAL HISTORY:  ? ?Past Medical History:  ?Diagnosis Date  ? Arthritis   ? Erosive esophagitis   ? GERD (gastroesophageal reflux disease)   ? HTN (hypertension)   ? Internal hemorrhoids 12/02/2002  ? ? ?PAST SURGICAL HISTORY:  ? ?Past Surgical History:  ?Procedure  Laterality Date  ? APPENDECTOMY    ? BREAST LUMPECTOMY WITH RADIOFREQUENCY TAG IDENTIFICATION Left 0/27/7412  ? Procedure: BREAST LUMPECTOMY WITH RADIOFREQUENCY TAG IDENTIFICATION;  Surgeon: Rusty Aus, DO;  Location: [facility];  Service: General;  Laterality: Left;  ? BUNIONECTOMY    ? bilateral x5  ? COLONOSCOPY  10/07/2012  ? RMR: Colorectal polyps-treated and/or removed as described above. Colonic diverticulosis.(serrated adenomas) next TCS 5 years  ? COLONOSCOPY W/ BIOPSIES  12/08/2002  ? RMR: Internal hemorrhoids, otherwise normal rectum Polyp at endocecum cold biopsied/removed Abnormal appearing ileocecal valve (of doubtful clinical significance)  biopsy showed chronic active colitis with significant lymphocytic infiltrate  ? ESOPHAGOGASTRODUODENOSCOPY  04/28/2007  ? RMR: Distal esophageal erosions consistent with mild to moderate  reflux esophagitis, otherwise, normal esophagus, small hiatal hernia; otherwise, normal stomach, first and second duodenum  ? EXCISION OF BREAST BIOPSY Left 11/01/2021  ? Procedure: EXCISION OF BREAST BIOPSY;  Surgeon: Rusty Aus, DO;  Location: [facility];  Service: General;  Laterality: Left;  ? HEMORRHOID SURGERY    ? KIDNEY STONE SURGERY    ? SHOULDER SURGERY    ? left  ? ? ?SOCIAL HISTORY:  ? ?Social History  ? ?Tobacco Use  ? Smoking status: Former  ?  Packs/day: 1.00  ?  Years: 15.00  ?  Pack years: 15.00  ?  Types: Cigarettes  ? Smokeless tobacco: Never  ?Substance Use Topics  ? Alcohol use: No  ? ? ?FAMILY HISTORY:  ? ?  Family History  ?Problem Relation Age of Onset  ? CAD Mother   ?     deceased age 42  ? Pneumonia Father   ?     deceased age 49/also with h/o CVA  ? Colon cancer Neg Hx   ? ? ?DRUG ALLERGIES:  ? ?Allergies  ?Allergen Reactions  ? Naproxen   ?  Swelling or itching?  ? Piroxicam   ?  Swelling or itching?  ? ? ?REVIEW OF SYSTEMS:  ? ?ROS ?As per history of present illness. All pertinent systems were reviewed above. Constitutional, HEENT,  cardiovascular, respiratory, GI, GU, musculoskeletal, neuro, psychiatric, endocrine, integumentary and hematologic systems were reviewed and are otherwise negative/unremarkable except for positive findings mentioned above in the HPI. ? ? ?MEDICATIONS AT HOME:  ? ?Prior to Admission medications   ?Medication Sig Start Date End Date Taking? Authorizing Provider  ?amLODipine (NORVASC) 5 MG tablet Take 5 mg by mouth daily.      [provider]  ?gabapentin (NEURONTIN) 300 MG capsule Take 300 mg by mouth 3 (three) times daily. 11/11/17   [provider]  ?hydrochlorothiazide (HYDRODIURIL) 25 MG tablet Take 25 mg by mouth daily.    [provider]  ?omeprazole (PRILOSEC) 20 MG capsule Take one capsule once to twice daily before meals for acid reflux/heartburn. ?Patient taking differently: Take 20 mg by mouth daily. 11/20/17   Mahala Menghini, PA-C  ?oxyCODONE (ROXICODONE) 5 MG immediate release tablet Take 1 tablet (5 mg total) by mouth every 6 (six) hours as needed. 11/01/21   Pappayliou, Barnetta Chapel A, DO  ?potassium chloride SA (KLOR-CON M) 20 MEQ tablet Take 1 tablet (20 mEq total) by mouth 2 (two) times daily for 4 days. 10/30/21 11/03/21  Pappayliou, Flint Melter, DO  ? ?  ? ?VITAL SIGNS:  ?Blood pressure 118/61, pulse 81, temperature 98.7 ?F (37.1 ?C), temperature source Oral, resp. rate (!) 29, height 5' 5.5" (1.664 m), weight 83 kg, SpO2 93 %. ? ?PHYSICAL EXAMINATION:  ?Physical Exam ? ?GENERAL:  81 y.o.-year-old Caucasian female patient lying in the bed with no acute distress.  ?EYES: Pupils equal, round, reactive to light and accommodation. No scleral icterus. Extraocular muscles intact.  ?HEENT: Head atraumatic, normocephalic. Oropharynx and nasopharynx clear.  ?NECK:  Supple, no jugular venous distention. No thyroid enlargement, no tenderness.  ?LUNGS: Normal breath sounds bilaterally, no wheezing, rales,rhonchi or crepitation. No use of accessory muscles of respiration.  ?CARDIOVASCULAR:  Regular rate and rhythm, S1, S2 normal. No murmurs, rubs, or gallops.  ?ABDOMEN: Soft, nondistended, with mild suprapubic tenderness without rebound tenderness guarding or rigidity. Bowel sounds present. No organomegaly or mass. ?She had left CVA tenderness. ?EXTREMITIES: No pedal edema, cyanosis, or clubbing.  ?NEUROLOGIC: Cranial nerves II through XII are intact. Muscle strength 5/5 in all extremities. Sensation intact. Gait not checked.  ?PSYCHIATRIC: The patient is alert and oriented x 3.  Normal affect and good eye contact. ?SKIN: No obvious rash, lesion, or ulcer.  ? ?LABORATORY PANEL:  ? ?CBC ?Recent Labs  ?Lab 12/05/21 ?0341  ?WBC 13.4*  ?HGB 9.9*  ?HCT 30.3*  ?PLT 120*  ? ?------------------------------------------------------------------------------------------------------------------ ? ?Chemistries  ?Recent Labs  ?Lab 12/04/21 ?1759  ?NA 134*  ?K 3.8  ?CL 96*  ?CO2 24  ?GLUCOSE 128*  ?BUN 39*  ?CREATININE 2.87*  ?CALCIUM 7.7*  ?AST 39  ?ALT 21  ?ALKPHOS 96  ?BILITOT 1.0  ? ?------------------------------------------------------------------------------------------------------------------ ? ?Cardiac Enzymes ?No results for input(s): TROPONINI in the last 168 hours. ?------------------------------------------------------------------------------------------------------------------ ? ?  RADIOLOGY:  ?CT Abdomen Pelvis Wo Contrast ? ?Result Date: 12/04/2021 ?CLINICAL DATA:  Nausea and vomiting, abdominal pain EXAM: CT ABDOMEN AND PELVIS WITHOUT CONTRAST TECHNIQUE: Multidetector CT imaging of the abdomen and pelvis was performed following the standard protocol without IV contrast. RADIATION DOSE REDUCTION: This exam was performed according to the departmental dose-optimization program which includes automated exposure control, adjustment of the mA and/or kV according to patient size and/or use of iterative reconstruction technique. COMPARISON:  None. FINDINGS: Lower chest: No acute pleural or parenchymal lung  disease. Scattered hypoventilatory changes at the lung bases. Postsurgical changes within the lower left breast. Hepatobiliary: Unremarkable unenhanced appearance of the liver and gallbladder. Pancreas: Unremarkable

## 2021-12-05 NOTE — Assessment & Plan Note (Signed)
-   We will continue Norvasc and hold off HCTZ given acute kidney injury. ? ?

## 2021-12-05 NOTE — ED Provider Notes (Signed)
?[place] ?Provider Note ? ? ?CSN: [id number] ?Arrival date & time: 12/04/21  1654 ? ?  ? ?History ? ?Chief Complaint  ?Patient presents with  ? Abdominal Pain  ? ? ?Tricia Jenkins is a 81 y.o. female. ? ?HPI ?Patient presents with her daughter who assists with the history.  She presents due to abdominal pain.  She has generalized discomfort, but focally complains of pain around her diffuse abdomen diffusely.  No urinary complaints, no fever, there is nausea, but no vomiting, no relief with anything. ?  ? ?Home Medications ?Prior to Admission medications   ?Medication Sig Start Date End Date Taking? Authorizing Provider  ?amLODipine (NORVASC) 5 MG tablet Take 5 mg by mouth daily.      [provider]  ?gabapentin (NEURONTIN) 300 MG capsule Take 300 mg by mouth 3 (three) times daily. 11/11/17   [provider]  ?hydrochlorothiazide (HYDRODIURIL) 25 MG tablet Take 25 mg by mouth daily.    [provider]  ?omeprazole (PRILOSEC) 20 MG capsule Take one capsule once to twice daily before meals for acid reflux/heartburn. ?Patient taking differently: Take 20 mg by mouth daily. 11/20/17   Mahala Menghini, PA-C  ?oxyCODONE (ROXICODONE) 5 MG immediate release tablet Take 1 tablet (5 mg total) by mouth every 6 (six) hours as needed. 11/01/21   Pappayliou, Barnetta Chapel A, DO  ?potassium chloride SA (KLOR-CON M) 20 MEQ tablet Take 1 tablet (20 mEq total) by mouth 2 (two) times daily for 4 days. 10/30/21 11/03/21  Pappayliou, Flint Melter, DO  ?   ? ?Allergies    ?Naproxen and Piroxicam   ? ?Review of Systems   ?Review of Systems  ?Constitutional:   ?     Per HPI, otherwise negative  ?HENT:    ?     Per HPI, otherwise negative  ?Respiratory:    ?     Per HPI, otherwise negative  ?Cardiovascular:   ?     Per HPI, otherwise negative  ?Gastrointestinal:  Negative for vomiting.  ?Endocrine:  ?     Negative aside from HPI  ?Genitourinary:   ?     Neg aside from HPI   ?Musculoskeletal:   ?     Per  HPI, otherwise negative  ?Skin: Negative.   ?Neurological:  Negative for syncope.  ? ?Physical Exam ?Updated Vital Signs ?BP (!) 120/57   Pulse 78   Temp 98.7 ?F (37.1 ?C) (Oral)   Resp 16   Ht 5' 5.5" (1.664 m)   Wt 83 kg   SpO2 95%   BMI 29.99 kg/m?  ?Physical Exam ?Vitals and nursing note reviewed.  ?Constitutional:   ?   Appearance: She is well-developed. She is obese. She is diaphoretic.  ?HENT:  ?   Head: Normocephalic and atraumatic.  ?Eyes:  ?   Conjunctiva/sclera: Conjunctivae normal.  ?Cardiovascular:  ?   Rate and Rhythm: Normal rate and regular rhythm.  ?Pulmonary:  ?   Effort: Pulmonary effort is normal. No respiratory distress.  ?   Breath sounds: Normal breath sounds. No stridor.  ?Abdominal:  ?   General: There is no distension.  ?   Tenderness: There is generalized abdominal tenderness. There is guarding.  ?Skin: ?   General: Skin is warm.  ?Neurological:  ?   Mental Status: She is alert and oriented to person, place, and time.  ?   Cranial Nerves: No cranial nerve deficit.  ?Psychiatric:     ?  Mood and Affect: Mood normal.  ? ? ?ED Results / Procedures / Treatments   ?Labs ?(all labs ordered are listed, but only abnormal results are displayed) ?Labs Reviewed  ?COMPREHENSIVE METABOLIC PANEL - Abnormal; Notable for the following components:  ?    Result Value  ? Sodium 134 (*)   ? Chloride 96 (*)   ? Glucose, Bld 128 (*)   ? BUN 39 (*)   ? Creatinine, Ser 2.87 (*)   ? Calcium 7.7 (*)   ? Albumin 3.2 (*)   ? GFR, Estimated 16 (*)   ? All other components within normal limits  ?CBC - Abnormal; Notable for the following components:  ? WBC 14.8 (*)   ? Platelets 135 (*)   ? All other components within normal limits  ?RESP PANEL BY RT-PCR (FLU A&B, COVID) ARPGX2  ?LIPASE, BLOOD  ?URINALYSIS, ROUTINE W REFLEX MICROSCOPIC  ? ? ?EKG ?None ? ?Radiology ?CT Abdomen Pelvis Wo Contrast ? ?Result Date: 12/04/2021 ?CLINICAL DATA:  Nausea and vomiting, abdominal pain EXAM: CT ABDOMEN AND PELVIS WITHOUT  CONTRAST TECHNIQUE: Multidetector CT imaging of the abdomen and pelvis was performed following the standard protocol without IV contrast. RADIATION DOSE REDUCTION: This exam was performed according to the departmental dose-optimization program which includes automated exposure control, adjustment of the mA and/or kV according to patient size and/or use of iterative reconstruction technique. COMPARISON:  None. FINDINGS: Lower chest: No acute pleural or parenchymal lung disease. Scattered hypoventilatory changes at the lung bases. Postsurgical changes within the lower left breast. Hepatobiliary: Unremarkable unenhanced appearance of the liver and gallbladder. Pancreas: Unremarkable unenhanced appearance. Spleen: Unremarkable unenhanced appearance. Adrenals/Urinary Tract: Mild right renal cortical atrophy. No right-sided calculi or obstructive uropathy. 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 Mild dilation of the left renal pelvis and portions of the left ureter. No radiopaque urinary tract calculi. Mild fat stranding surrounding the left kidney and left ureter, which could reflect infection or recent passage of a urinary tract calculus. The adrenals and bladder are unremarkable. Stomach/Bowel: No bowel obstruction or ileus. The appendix is surgically absent. No bowel wall thickening or inflammatory change. Vascular/Lymphatic: Aortic atherosclerosis. No enlarged abdominal or pelvic lymph nodes. Reproductive: Uterus and bilateral adnexa are unremarkable. Other: No free fluid or free gas.  No abdominal wall hernia. Musculoskeletal: No acute or destructive bony lesions. Reconstructed images demonstrate no additional findings. IMPRESSION: 1. Mild distension of the left renal pelvis and left ureter, with surrounding fat stranding. Findings could reflect recent passage of urinary tract calculus or underlying infection. Please correlate with urinalysis. 2. No radiopaque  urinary tract calculi. 3.  Aortic Atherosclerosis (ICD10-I70.0). Electronically Signed   By: Randa Ngo M.D.   On: 12/04/2021 23:53   ? ?Procedures ?Procedures  ? ? ?Medications Ordered in ED ?Medications  ?sodium chloride 0.9 % bolus 1,000 mL (0 mLs Intravenous Stopped 12/04/21 2349)  ? ? ?ED Course/ Medical Decision Making/ A&P ?This patient presents to the ED for concern of abdominal pain, nausea, this involves an extensive number of treatment options, and is a complaint that carries with it a high risk of complications and morbidity.  The differential diagnosis includes peritonitis, diverticulitis, colitis, urinary tract disease, stones ? ? ?Co morbidities that complicate the patient evaluation ? ?Obesity, age ? ?Social Determinants of Health: ? ?Obesity, age ? ?Additional history obtained: ? ?Additional history and/or information obtained from daughter at bedside ?External records from outside source obtained and  reviewed including history of present illness ? ? ?After the initial evaluation, orders, including: CT, labs, were initiated. ? ? ?Patient placed on Cardiac and Pulse-Oximetry Monitors. ?The patient was maintained on a cardiac monitor.  The cardiac monitored showed an rhythm of 80 sinus normal ?The patient was also maintained on pulse oximetry. The readings were typically 100% room air normal ? ? ?On repeat evaluation of the patient stayed the same ? ?Lab Tests: ? ?I personally interpreted labs.  The pertinent results include: Leukocytosis, acute kidney injury ? ?Imaging Studies ordered: ? ?I independently visualized and interpreted imaging which showed pattern consistent with possibly passed stone versus inflammation of the left renal pelvis, ureter ?I agree with the radiologist interpretation ? ?Dispostion / Final MDM: ? ?After consideration of the diagnostic results and the patient's response to treatment, she will likely require admission.  Fluid resuscitation started in the ED, on signout she  is awaiting urinalysis.  Initial findings concerning for acute kidney injury, possibly secondary to nausea, vomiting, with additional consideration of pyelonephritis. ? ?Final Clinical Impression(s) / ED Diagnoses

## 2021-12-06 DIAGNOSIS — E876 Hypokalemia: Secondary | ICD-10-CM | POA: Diagnosis not present

## 2021-12-06 DIAGNOSIS — N189 Chronic kidney disease, unspecified: Secondary | ICD-10-CM

## 2021-12-06 DIAGNOSIS — R1084 Generalized abdominal pain: Secondary | ICD-10-CM | POA: Diagnosis not present

## 2021-12-06 DIAGNOSIS — N179 Acute kidney failure, unspecified: Secondary | ICD-10-CM | POA: Diagnosis not present

## 2021-12-06 DIAGNOSIS — K219 Gastro-esophageal reflux disease without esophagitis: Secondary | ICD-10-CM | POA: Diagnosis not present

## 2021-12-06 DIAGNOSIS — N12 Tubulo-interstitial nephritis, not specified as acute or chronic: Secondary | ICD-10-CM | POA: Diagnosis not present

## 2021-12-06 LAB — BASIC METABOLIC PANEL
Anion gap: 11 (ref 5–15)
BUN: 44 mg/dL — ABNORMAL HIGH (ref 8–23)
CO2: 20 mmol/L — ABNORMAL LOW (ref 22–32)
Calcium: 6.7 mg/dL — ABNORMAL LOW (ref 8.9–10.3)
Chloride: 102 mmol/L (ref 98–111)
Creatinine, Ser: 2.73 mg/dL — ABNORMAL HIGH (ref 0.44–1.00)
GFR, Estimated: 17 mL/min — ABNORMAL LOW (ref >60)
Glucose, Bld: 100 mg/dL — ABNORMAL HIGH (ref 70–99)
Potassium: 3.2 mmol/L — ABNORMAL LOW (ref 3.5–5.1)
Sodium: 133 mmol/L — ABNORMAL LOW (ref 135–145)

## 2021-12-06 LAB — CBC WITH DIFFERENTIAL/PLATELET
Abs Immature Granulocytes: 0.11 10*3/uL — ABNORMAL HIGH (ref 0.00–0.07)
Basophils Absolute: 0 10*3/uL (ref 0.0–0.1)
Basophils Relative: 0 %
Eosinophils Absolute: 0 10*3/uL (ref 0.0–0.5)
Eosinophils Relative: 0 %
HCT: 29.6 % — ABNORMAL LOW (ref 36.0–46.0)
Hemoglobin: 9.7 g/dL — ABNORMAL LOW (ref 12.0–15.0)
Immature Granulocytes: 1 %
Lymphocytes Relative: 5 %
Lymphs Abs: 0.6 10*3/uL — ABNORMAL LOW (ref 0.7–4.0)
MCH: 29.2 pg (ref 26.0–34.0)
MCHC: 32.8 g/dL (ref 30.0–36.0)
MCV: 89.2 fL (ref 80.0–100.0)
Monocytes Absolute: 1.4 10*3/uL — ABNORMAL HIGH (ref 0.1–1.0)
Monocytes Relative: 11 %
Neutro Abs: 9.7 10*3/uL — ABNORMAL HIGH (ref 1.7–7.7)
Neutrophils Relative %: 83 %
Platelets: 151 10*3/uL (ref 150–400)
RBC: 3.32 MIL/uL — ABNORMAL LOW (ref 3.87–5.11)
RDW: 14.8 % (ref 11.5–15.5)
WBC: 11.9 10*3/uL — ABNORMAL HIGH (ref 4.0–10.5)
nRBC: 0 % (ref 0.0–0.2)

## 2021-12-06 LAB — PROCALCITONIN: Procalcitonin: 6.06 ng/mL

## 2021-12-06 LAB — MAGNESIUM: Magnesium: 1.2 mg/dL — ABNORMAL LOW (ref 1.7–2.4)

## 2021-12-06 MED ORDER — POTASSIUM CHLORIDE CRYS ER 20 MEQ PO TBCR
40.0000 meq | EXTENDED_RELEASE_TABLET | Freq: Once | ORAL | Status: AC
Start: 2021-12-06 — End: 2021-12-06
  Administered 2021-12-06: 40 meq via ORAL
  Filled 2021-12-06: qty 2

## 2021-12-06 NOTE — Care Management Important Message (Signed)
Important Message ? ?Patient Details  ?Name: Tricia Jenkins ?MRN: [id number] ?Date of Birth: 10-24-1940 ? ? ?Medicare Important Message Given:  Yes (late entry) ? ? ? ? ?Tommy Medal ?12/06/2021, 3:33 PM ?

## 2021-12-06 NOTE — Evaluation (Signed)
Physical Therapy Evaluation ?Patient Details ?Name: Tricia Jenkins ?MRN: [id number] ?DOB: 08-Nov-1940 ?Today's Date: 12/06/2021 ? ?History of Present Illness ? TYRONDA VIZCARRONDO is a 81 y.o. Caucasian female with medical history significant for GERD, hypertension and osteoarthritis with as CKD 3b, who presented to the emergency room with acute onset of lower abdominal pain as well as of left flank pain with diminished p.o. intake and associated nausea.  She has been having dysuria and difficulty urinating.  She admits to mild cough without wheezing or dyspnea.  She denies any fever or chills.  No chest pain or dyspnea or palpitations. ?  ?Clinical Impression ? Patient functioning near baseline for functional mobility and gait with slightly labored movement and occasional leaning on nearby objects for support.  Patient demonstrates good return for transferring to/from commode in bathroom and ambulating in hallway without loss of balance.  Patient tolerated sitting up in chair after therapy and encouraged to stay our of bed as much as tolerated.  Patient will benefit from continued skilled physical therapy in hospital and recommended venue below to increase strength, balance, endurance for safe ADLs and gait.  ? ?   ?   ? ?Recommendations for follow up therapy are one component of a multi-disciplinary discharge planning process, led by the attending physician.  Recommendations may be updated based on patient status, additional functional criteria and insurance authorization. ? ?Follow Up Recommendations Home health PT ? ?  ?Assistance Recommended at Discharge PRN  ?Patient can return home with the following ? A little help with walking and/or transfers;Help with stairs or ramp for entrance;A little help with bathing/dressing/bathroom;Assistance with cooking/housework ? ?  ?Equipment Recommendations Kasandra Knudsen  ?Recommendations for Other Services ?    ?  ?Functional Status Assessment Patient has had a recent decline in their  functional status and demonstrates the ability to make significant improvements in function in a reasonable and predictable amount of time.  ? ?  ?Precautions / Restrictions Precautions ?Precautions: Fall ?Restrictions ?Weight Bearing Restrictions: No  ? ?  ? ?Mobility ? Bed Mobility ?Overal bed mobility: Modified Independent ?  ?  ?  ?  ?  ?  ?  ?  ? ?Transfers ?Overall transfer level: Needs assistance ?Equipment used: None ?Transfers: Sit to/from Stand, Bed to chair/wheelchair/BSC ?Sit to Stand: Supervision, Modified independent (Device/Increase time) ?  ?Step pivot transfers: Supervision, Modified independent (Device/Increase time) ?  ?  ?  ?General transfer comment: slightly labored movement ?  ? ?Ambulation/Gait ?Ambulation/Gait assistance: Supervision, Min guard ?Gait Distance (Feet): 65 Feet ?Assistive device: None ?Gait Pattern/deviations: Decreased step length - right, Decreased step length - left, Decreased stride length, Scissoring ?Gait velocity: decreased ?  ?  ?General Gait Details: slightly labored cadence with occasional leaning on side rail for support,  had episode of scissoring legs without loss of balance when distracted, limited secondary to fatigue ? ?Stairs ?  ?  ?  ?  ?  ? ?Wheelchair Mobility ?  ? ?Modified Rankin (Stroke Patients Only) ?  ? ?  ? ?Balance Overall balance assessment: Mild deficits observed, not formally tested ?  ?  ?  ?  ?  ?  ?  ?  ?  ?  ?  ?  ?  ?  ?  ?  ?  ?  ?   ? ? ? ?Pertinent Vitals/Pain Pain Assessment ?Pain Assessment: 0-10 ?Pain Score: 5  ?Pain Location: left flank ?Pain Descriptors / Indicators: Sore, Discomfort ?Pain Intervention(s): Limited activity within patient's  tolerance, Monitored during session, Repositioned  ? ? ?Home Living Family/patient expects to be discharged to:: Private residence ?Living Arrangements: Alone ?Available Help at Discharge: Family;Available PRN/intermittently ?Type of Home: House ?Home Access: Ramped entrance ?  ?  ?  ?Home Layout:  One level ?Home Equipment: Shower seat;BSC/3in1 ?   ?  ?Prior Function Prior Level of Function : Independent/Modified Independent ?  ?  ?  ?  ?  ?  ?Mobility Comments: Hydrographic surveyor, drives ?ADLs Comments: Independent ?  ? ? ?Hand Dominance  ?   ? ?  ?Extremity/Trunk Assessment  ? Upper Extremity Assessment ?Upper Extremity Assessment: Overall WFL for tasks assessed ?  ? ?Lower Extremity Assessment ?Lower Extremity Assessment: Generalized weakness ?  ? ?Cervical / Trunk Assessment ?Cervical / Trunk Assessment: Normal  ?Communication  ? Communication: No difficulties  ?Cognition Arousal/Alertness: Awake/alert ?Behavior During Therapy: [facility] for tasks assessed/performed ?Overall Cognitive Status: Within Functional Limits for tasks assessed ?  ?  ?  ?  ?  ?  ?  ?  ?  ?  ?  ?  ?  ?  ?  ?  ?  ?  ?  ? ?  ?General Comments   ? ?  ?Exercises    ? ?Assessment/Plan  ?  ?PT Assessment Patient needs continued PT services  ?PT Problem List Decreased strength;Decreased activity tolerance;Decreased balance;Decreased mobility ? ?   ?  ?PT Treatment Interventions DME instruction;Gait training;Stair training;Functional mobility training;Therapeutic activities;Therapeutic exercise;Patient/family education;Balance training   ? ?PT Goals (Current goals can be found in the Care Plan section)  ?Acute Rehab PT Goals ?Patient Stated Goal: return home with family to assist ?Time For Goal Achievement: 12/10/21 ?Potential to Achieve Goals: Good ? ?  ?Frequency Min 3X/week ?  ? ? ?Co-evaluation   ?  ?  ?  ?  ? ? ?  ?AM-PAC PT "6 Clicks" Mobility  ?Outcome Measure Help needed turning from your back to your side while in a flat bed without using bedrails?: None ?Help needed moving from lying on your back to sitting on the side of a flat bed without using bedrails?: None ?Help needed moving to and from a bed to a chair (including a wheelchair)?: A Little ?Help needed standing up from a chair using your arms (e.g., wheelchair or bedside  chair)?: A Little ?Help needed to walk in hospital room?: A Little ?Help needed climbing 3-5 steps with a railing? : A Little ?6 Click Score: 20 ? ?  ?End of Session   ?Activity Tolerance: Patient tolerated treatment well ?Patient left: in chair;with call bell/phone within reach ?Nurse Communication: Mobility status ?PT Visit Diagnosis: Unsteadiness on feet (R26.81);Other abnormalities of gait and mobility (R26.89);Muscle weakness (generalized) (M62.81) ?  ? ?Time: 1610-9604 ?PT Time Calculation (min) (ACUTE ONLY): 25 min ? ? ?Charges:   PT Evaluation ?$PT Eval Moderate Complexity: 1 Mod ?PT Treatments ?$Therapeutic Activity: 23-37 mins ?  ?   ? ? ?4:00 PM, 12/06/21 ?Lonell Grandchild, MPT ?Physical Therapist with  ?[facility] ?[phone] office ?[phone] mobile phone ? ? ?

## 2021-12-06 NOTE — Plan of Care (Signed)
?  Problem: Acute Rehab PT Goals(only PT should resolve) ?Goal: Pt Will Go Supine/Side To Sit ?Outcome: Progressing ?Flowsheets (Taken 12/06/2021 1602) ?Pt will go Supine/Side to Sit: ? Independently ? with modified independence ?Goal: Patient Will Perform Sitting Balance ?Outcome: Progressing ?Flowsheets (Taken 12/06/2021 1602) ?Patient will perform sitting balance: Independently ?Goal: Pt Will Transfer Bed To Chair/Chair To Bed ?Outcome: Progressing ?Flowsheets (Taken 12/06/2021 1602) ?Pt will Transfer Bed to Chair/Chair to Bed: ? with modified independence ? Independently ?Goal: Pt Will Ambulate ?Outcome: Progressing ?Flowsheets (Taken 12/06/2021 1602) ?Pt will Ambulate: ? 100 feet ? with modified independence ? with least restrictive assistive device ?  ?Problem: Acute Rehab PT Goals(only PT should resolve) ?Goal: Patient Will Transfer Sit To/From Stand ?Outcome: Progressing ?  ?4:03 PM, 12/06/21 ?Lonell Grandchild, MPT ?Physical Therapist with [facility] ?[facility] ?[phone] office ?[phone] mobile phone ? ?

## 2021-12-06 NOTE — Assessment & Plan Note (Addendum)
Repleted. °

## 2021-12-06 NOTE — Progress Notes (Addendum)
?PROGRESS NOTE ? ? ?Tricia Jenkins  QQV:[id number] DOB: 09-17-1941 DOA: 12/04/2021 ?PCP: Asencion Noble, MD  ? ?Chief Complaint  ?Patient presents with  ? Abdominal Pain  ? ?Level of care: Telemetry ? ?Brief Admission History:  ? 81 y.o. Caucasian female with medical history significant for GERD, hypertension and osteoarthritis with as CKD 3b, who presented to the emergency room with acute onset of lower abdominal pain as well as of left flank pain with diminished p.o. intake and associated nausea.  She has been having dysuria and difficulty urinating.  She admits to mild cough without wheezing or dyspnea.  She denies any fever or chills.  No chest pain or dyspnea or palpitations. ?  ?ED Course: When the patient came to the ER, heart rate was 94 vital signs were within normal.  Labs revealed mild hyponatremia and hypochloremia with a BUN of 39 and creatinine of 2.87 above previous levels.  CBC showed leukocytosis of 14.8 and platelets of 135.  Influenza antigens and COVID-19 PCR came back negative.  UA was positive for UTI. ?  ?Imaging: Abdominal and pelvic CT scan showed mild distention of the left renal pelvis with left ureter and surrounding fat stranding, findings that could reflect recent passage of urinary tract calculus or underlying infection/pyelonephritis.  It showed aortic atherosclerosis and no radiopaque calculi. ? ?The patient was given a gram of IV Rocephin and 1 L bolus of IV normal saline.  She will be admitted to a telemetry bed for further evaluation and management. ? ?12/06/2021: Patient reports that she continues to feel weak.  She still has some left flank pain as well.  She denies fever and chills.  Blood cultures no growth to date. ? ?  ?Assessment and Plan: ?* Pyelonephritis of left kidney ?- The patient will be admitted to a medical telemetry bed. ?- We will continue hydration with IV normal saline. ?- We will continue antibiotic therapy with IV Rocephin. ?- We will follow blood and urine  cultures. ? ?Acute kidney injury superimposed on chronic kidney disease ([place]) ?- The patient will be hydrated with IV normal saline. ?- We will follow BMP. ?- We will avoid nephrotoxins. ? ?GERD without esophagitis ?- We will continue PPI therapy. ? ?Essential hypertension ?- We will continue Norvasc and hold off HCTZ given acute kidney injury. ? ? ?Generalized abdominal pain ?No abdominal pain today.  Continue to follow.  ? ?Sepsis RULED OUT ?- Continue supportive measures and treatments.  ? ?GERD ?Protonix ordered for GI protection.  ? ?Hypokalemia ?Replacement ordered.  Recheck in AM.  ? ? ?DVT prophylaxis: enoxaparin ?Code Status: Partial  ?Family Communication:  ?Disposition: Status is: Inpatient ?Remains inpatient appropriate because: IV antibiotics ?  ?Consultants:  ?PT  ?Procedures:  ?N/a ?Antimicrobials:  ?Ceftriaxone 3/16>>  ?Subjective: ?Pt reports she continues to feel weak.  ?Objective: ?Vitals:  ? 12/05/21 1320 12/05/21 1720 12/05/21 2205 12/06/21 0502  ?BP: (!) 127/54 125/72 (!) 122/57 (!) 129/51  ?Pulse: 91 90 79 83  ?Resp: '20 19 20 20  '$ ?Temp: 99.1 ?F (37.3 ?C) 98.9 ?F (37.2 ?C) 98.7 ?F (37.1 ?C) 97.9 ?F (36.6 ?C)  ?TempSrc: Oral Oral    ?SpO2: 95% 95% 90% 92%  ?Weight:      ?Height:      ? ? ?Intake/Output Summary (Last 24 hours) at 12/06/2021 1108 ?Last data filed at 12/06/2021 0500 ?Gross per 24 hour  ?Intake 1493.36 ml  ?Output --  ?Net 1493.36 ml  ? ?Filed Weights  ? 12/04/21 1740  12/05/21 0932  ?Weight: 83 kg 84.2 kg  ? ?Examination: ? ?General exam: Appears calm and comfortable  ?Respiratory system: Clear to auscultation. Respiratory effort normal. ?Cardiovascular system: normal S1 & S2 heard. No JVD, murmurs, rubs, gallops or clicks. No pedal edema. ?Gastrointestinal system: Abdomen is nondistended, soft and nontender. No organomegaly or masses felt. Normal bowel sounds heard. ?Central nervous system: Alert and oriented. No focal neurological deficits. ?Extremities: Symmetric 5 x 5  power. ?Skin: No rashes, lesions or ulcers. ?Psychiatry: Judgement and insight appear normal. Mood & affect appropriate.  ? ?Data Reviewed: I have personally reviewed following labs and imaging studies ? ?CBC: ?Recent Labs  ?Lab 12/04/21 ?1759 12/05/21 ?0341 12/06/21 ?3825  ?WBC 14.8* 13.4* 11.9*  ?NEUTROABS  --   --  9.7*  ?HGB 12.0 9.9* 9.7*  ?HCT 36.7 30.3* 29.6*  ?MCV 88.0 89.1 89.2  ?PLT 135* 120* 151  ? ? ?Basic Metabolic Panel: ?Recent Labs  ?Lab 12/04/21 ?1759 12/05/21 ?0341 12/06/21 ?0539  ?NA 134* 132* 133*  ?K 3.8 3.8 3.2*  ?CL 96* 99 102  ?CO2 24 22 20*  ?GLUCOSE 128* 94 100*  ?BUN 39* 39* 44*  ?CREATININE 2.87* 2.76* 2.73*  ?CALCIUM 7.7* 6.8* 6.7*  ?MG  --   --  1.2*  ? ? ?CBG: ?No results for input(s): GLUCAP in the last 168 hours. ? ?Recent Results (from the past 240 hour(s))  ?Resp Panel by RT-PCR (Flu A&B, Covid) Nasopharyngeal Swab     Status: None  ? Collection Time: 12/04/21  9:22 PM  ? Specimen: Nasopharyngeal Swab; Nasopharyngeal(NP) swabs in vial transport medium  ?Result Value Ref Range Status  ? SARS Coronavirus 2 by RT PCR NEGATIVE NEGATIVE Final  ?  Comment: (NOTE) ?SARS-CoV-2 target nucleic acids are NOT DETECTED. ? ?The SARS-CoV-2 RNA is generally detectable in upper respiratory ?specimens during the acute phase of infection. The lowest ?concentration of SARS-CoV-2 viral copies this assay can detect is ?138 copies/mL. A negative result does not preclude SARS-Cov-2 ?infection and should not be used as the sole basis for treatment or ?other patient management decisions. A negative result may occur with  ?improper specimen collection/handling, submission of specimen other ?than nasopharyngeal swab, presence of viral mutation(s) within the ?areas targeted by this assay, and inadequate number of viral ?copies(<138 copies/mL). A negative result must be combined with ?clinical observations, patient history, and epidemiological ?information. The expected result is Negative. ? ?Fact Sheet for  Patients:  ?EntrepreneurPulse.com.au ? ?Fact Sheet for Healthcare Providers:  ?IncredibleEmployment.be ? ?This test is no t yet approved or cleared by the [place] FDA and  ?has been authorized for detection and/or diagnosis of SARS-CoV-2 by ?FDA under an Emergency Use Authorization (EUA). This EUA will remain  ?in effect (meaning this test can be used) for the duration of the ?COVID-19 declaration under Section 564(b)(1) of the Act, 21 ?U.S.C.section 360bbb-3(b)(1), unless the authorization is terminated  ?or revoked sooner.  ? ? ?  ? Influenza A by PCR NEGATIVE NEGATIVE Final  ? Influenza B by PCR NEGATIVE NEGATIVE Final  ?  Comment: (NOTE) ?The Xpert Xpress SARS-CoV-2/FLU/RSV plus assay is intended as an aid ?in the diagnosis of influenza from Nasopharyngeal swab specimens and ?should not be used as a sole basis for treatment. Nasal washings and ?aspirates are unacceptable for Xpert Xpress SARS-CoV-2/FLU/RSV ?testing. ? ?Fact Sheet for Patients: ?EntrepreneurPulse.com.au ? ?Fact Sheet for Healthcare Providers: ?IncredibleEmployment.be ? ?This test is not yet approved or cleared by the [place] and ?has been  authorized for detection and/or diagnosis of SARS-CoV-2 by ?FDA under an Emergency Use Authorization (EUA). This EUA will remain ?in effect (meaning this test can be used) for the duration of the ?COVID-19 declaration under Section 564(b)(1) of the Act, 21 U.S.C. ?section 360bbb-3(b)(1), unless the authorization is terminated or ?revoked. ? ?Performed at [facility], [address], [place], [place] [address] ?  ?  ? ?Radiology Studies: ?CT Abdomen Pelvis Wo Contrast ? ?Result Date: 12/04/2021 ?CLINICAL DATA:  Nausea and vomiting, abdominal pain EXAM: CT ABDOMEN AND PELVIS WITHOUT CONTRAST TECHNIQUE: Multidetector CT imaging of the abdomen and pelvis was performed following the standard protocol without IV contrast. RADIATION  DOSE REDUCTION: This exam was performed according to the departmental dose-optimization program which includes automated exposure control, adjustment of the mA and/or kV according to patient size and/or use of iterative recons

## 2021-12-06 NOTE — Assessment & Plan Note (Addendum)
No abdominal pain today.   ?

## 2021-12-06 NOTE — Assessment & Plan Note (Signed)
-   Protonix ordered for GI protection 

## 2021-12-07 ENCOUNTER — Inpatient Hospital Stay (HOSPITAL_COMMUNITY): Payer: Medicare Other

## 2021-12-07 LAB — BASIC METABOLIC PANEL
Anion gap: 12 (ref 5–15)
BUN: 43 mg/dL — ABNORMAL HIGH (ref 8–23)
CO2: 20 mmol/L — ABNORMAL LOW (ref 22–32)
Calcium: 6.9 mg/dL — ABNORMAL LOW (ref 8.9–10.3)
Chloride: 102 mmol/L (ref 98–111)
Creatinine, Ser: 2.51 mg/dL — ABNORMAL HIGH (ref 0.44–1.00)
GFR, Estimated: 19 mL/min — ABNORMAL LOW (ref >60)
Glucose, Bld: 99 mg/dL (ref 70–99)
Potassium: 3.4 mmol/L — ABNORMAL LOW (ref 3.5–5.1)
Sodium: 134 mmol/L — ABNORMAL LOW (ref 135–145)

## 2021-12-07 LAB — PROCALCITONIN: Procalcitonin: 4.34 ng/mL

## 2021-12-07 MED ORDER — GABAPENTIN 100 MG PO CAPS
200.0000 mg | ORAL_CAPSULE | Freq: Three times a day (TID) | ORAL | Status: DC
  Administered 2021-12-07 – 2021-12-08 (×4): 200 mg via ORAL
  Filled 2021-12-07 (×4): qty 2

## 2021-12-07 MED ORDER — POTASSIUM CHLORIDE CRYS ER 20 MEQ PO TBCR
40.0000 meq | EXTENDED_RELEASE_TABLET | Freq: Once | ORAL | Status: AC
  Administered 2021-12-07: 40 meq via ORAL
  Filled 2021-12-07: qty 2

## 2021-12-07 NOTE — Progress Notes (Signed)
?PROGRESS NOTE ? ? ?Tricia Jenkins  GUR:[id number] DOB: 10/20/1940 DOA: 12/04/2021 ?PCP: Asencion Noble, MD  ? ?Chief Complaint  ?Patient presents with  ? Abdominal Pain  ? ?Level of care: Med-Surg ? ?Brief Admission History:  ? 81 y.o. Caucasian female with medical history significant for GERD, hypertension and osteoarthritis with as CKD 3b, who presented to the emergency room with acute onset of lower abdominal pain as well as of left flank pain with diminished p.o. intake and associated nausea.  She has been having dysuria and difficulty urinating.  She admits to mild cough without wheezing or dyspnea.  She denies any fever or chills.  No chest pain or dyspnea or palpitations. ?  ?ED Course: When the patient came to the ER, heart rate was 94 vital signs were within normal.  Labs revealed mild hyponatremia and hypochloremia with a BUN of 39 and creatinine of 2.87 above previous levels.  CBC showed leukocytosis of 14.8 and platelets of 135.  Influenza antigens and COVID-19 PCR came back negative.  UA was positive for UTI. ?  ?Imaging: Abdominal and pelvic CT scan showed mild distention of the left renal pelvis with left ureter and surrounding fat stranding, findings that could reflect recent passage of urinary tract calculus or underlying infection/pyelonephritis.  It showed aortic atherosclerosis and no radiopaque calculi. ? ?The patient was given a gram of IV Rocephin and 1 L bolus of IV normal saline.  She will be admitted to a telemetry bed for further evaluation and management. ? ?12/06/2021: Patient reports that she continues to feel weak.  She still has some left flank pain as well.  She denies fever and chills.  Blood cultures no growth to date. ? ?12/07/2021:  Pt slowly starting to ambulate more. Flank pain improving.  No fever.  Starting to eat again.   ?  ?Assessment and Plan: ?* Pyelonephritis of left kidney ?- We will continue hydration with IV normal saline. ?- We will continue antibiotic therapy with IV  Rocephin. ?- We will follow blood and urine cultures. No growth to date.  ? ?Acute kidney injury superimposed on chronic kidney disease ([place]) ?- The patient will be hydrated with IV normal saline. ?- We will follow BMP. ?- We will avoid nephrotoxins. ? ?GERD without esophagitis ?- We will continue PPI therapy. ? ?Essential hypertension ?- We will continue Norvasc and hold off HCTZ given acute kidney injury. ? ? ?Generalized abdominal pain ?No abdominal pain today.  Continue to follow.  ? ?Sepsis RULED OUT ?- Continue supportive measures and treatments.  ? ?GERD ?Protonix ordered for GI protection.  ? ?Hypokalemia ?Replacement ordered.  Recheck in AM.  ? ?DVT prophylaxis: enoxaparin ?Code Status: Partial  ?Family Communication:  ?Disposition: Status is: Inpatient ?Remains inpatient appropriate because: IV antibiotics ?  ?Consultants:  ?PT  ?Procedures:  ?N/a ?Antimicrobials:  ?Ceftriaxone 3/16>>  ?Subjective: ?Pt starting to eat again. No emesis so far.   ?Objective: ?Vitals:  ? 12/05/21 2205 12/06/21 0502 12/06/21 1445 12/06/21 2141  ?BP: (!) 122/57 (!) 129/51 132/65 (!) 131/57  ?Pulse: 79 83 82 73  ?Resp: '20 20 20 18  '$ ?Temp: 98.7 ?F (37.1 ?C) 97.9 ?F (36.6 ?C) 97.7 ?F (36.5 ?C) 98.6 ?F (37 ?C)  ?TempSrc:   Oral   ?SpO2: 90% 92% 98% 92%  ?Weight:      ?Height:      ? ? ?Intake/Output Summary (Last 24 hours) at 12/07/2021 1336 ?Last data filed at 12/07/2021 0500 ?Gross per 24 hour  ?Intake  580 ml  ?Output --  ?Net 580 ml  ? ?Filed Weights  ? 12/04/21 1740 12/05/21 0932  ?Weight: 83 kg 84.2 kg  ? ?Examination: ? ?General exam: Appears calm and comfortable  ?Respiratory system: Clear to auscultation. Respiratory effort normal. ?Cardiovascular system: normal S1 & S2 heard. No JVD, murmurs, rubs, gallops or clicks. No pedal edema. ?Gastrointestinal system: Abdomen is nondistended, soft and nontender. No organomegaly or masses felt. Normal bowel sounds heard. ?Central nervous system: Alert and oriented. No focal  neurological deficits. ?Extremities: Symmetric 5 x 5 power. ?Skin: No rashes, lesions or ulcers. ?Psychiatry: Judgement and insight appear normal. Mood & affect appropriate.  ? ?Data Reviewed: I have personally reviewed following labs and imaging studies ? ?CBC: ?Recent Labs  ?Lab 12/04/21 ?1759 12/05/21 ?0341 12/06/21 ?2947  ?WBC 14.8* 13.4* 11.9*  ?NEUTROABS  --   --  9.7*  ?HGB 12.0 9.9* 9.7*  ?HCT 36.7 30.3* 29.6*  ?MCV 88.0 89.1 89.2  ?PLT 135* 120* 151  ? ? ?Basic Metabolic Panel: ?Recent Labs  ?Lab 12/04/21 ?1759 12/05/21 ?0341 12/06/21 ?6546 12/07/21 ?5035  ?NA 134* 132* 133* 134*  ?K 3.8 3.8 3.2* 3.4*  ?CL 96* 99 102 102  ?CO2 24 22 20* 20*  ?GLUCOSE 128* 94 100* 99  ?BUN 39* 39* 44* 43*  ?CREATININE 2.87* 2.76* 2.73* 2.51*  ?CALCIUM 7.7* 6.8* 6.7* 6.9*  ?MG  --   --  1.2*  --   ? ? ?CBG: ?No results for input(s): GLUCAP in the last 168 hours. ? ?Recent Results (from the past 240 hour(s))  ?Resp Panel by RT-PCR (Flu A&B, Covid) Nasopharyngeal Swab     Status: None  ? Collection Time: 12/04/21  9:22 PM  ? Specimen: Nasopharyngeal Swab; Nasopharyngeal(NP) swabs in vial transport medium  ?Result Value Ref Range Status  ? SARS Coronavirus 2 by RT PCR NEGATIVE NEGATIVE Final  ?  Comment: (NOTE) ?SARS-CoV-2 target nucleic acids are NOT DETECTED. ? ?The SARS-CoV-2 RNA is generally detectable in upper respiratory ?specimens during the acute phase of infection. The lowest ?concentration of SARS-CoV-2 viral copies this assay can detect is ?138 copies/mL. A negative result does not preclude SARS-Cov-2 ?infection and should not be used as the sole basis for treatment or ?other patient management decisions. A negative result may occur with  ?improper specimen collection/handling, submission of specimen other ?than nasopharyngeal swab, presence of viral mutation(s) within the ?areas targeted by this assay, and inadequate number of viral ?copies(<138 copies/mL). A negative result must be combined with ?clinical  observations, patient history, and epidemiological ?information. The expected result is Negative. ? ?Fact Sheet for Patients:  ?EntrepreneurPulse.com.au ? ?Fact Sheet for Healthcare Providers:  ?IncredibleEmployment.be ? ?This test is no t yet approved or cleared by the [place] FDA and  ?has been authorized for detection and/or diagnosis of SARS-CoV-2 by ?FDA under an Emergency Use Authorization (EUA). This EUA will remain  ?in effect (meaning this test can be used) for the duration of the ?COVID-19 declaration under Section 564(b)(1) of the Act, 21 ?U.S.C.section 360bbb-3(b)(1), unless the authorization is terminated  ?or revoked sooner.  ? ? ?  ? Influenza A by PCR NEGATIVE NEGATIVE Final  ? Influenza B by PCR NEGATIVE NEGATIVE Final  ?  Comment: (NOTE) ?The Xpert Xpress SARS-CoV-2/FLU/RSV plus assay is intended as an aid ?in the diagnosis of influenza from Nasopharyngeal swab specimens and ?should not be used as a sole basis for treatment. Nasal washings and ?aspirates are unacceptable for Xpert Xpress SARS-CoV-2/FLU/RSV ?testing. ? ?  Fact Sheet for Patients: ?EntrepreneurPulse.com.au ? ?Fact Sheet for Healthcare Providers: ?IncredibleEmployment.be ? ?This test is not yet approved or cleared by the [place] FDA and ?has been authorized for detection and/or diagnosis of SARS-CoV-2 by ?FDA under an Emergency Use Authorization (EUA). This EUA will remain ?in effect (meaning this test can be used) for the duration of the ?COVID-19 declaration under Section 564(b)(1) of the Act, 21 U.S.C. ?section 360bbb-3(b)(1), unless the authorization is terminated or ?revoked. ? ?Performed at [facility], [address], [place], [place] [address] ?  ?  ? ?Radiology Studies: ?US RENAL ? ?Result Date: 12/07/2021 ?CLINICAL DATA:  Abdominal pain abnormality is seen in the left kidney in the previous CT EXAM: RENAL / URINARY TRACT ULTRASOUND COMPLETE  COMPARISON:  CT done on 12/04/2021 FINDINGS: Right Kidney: Renal measurements: 8.9 x 5.3 x 4.9 cm = volume: 119.9 mL. There is no hydronephrosis. There are foci of cortical thinning. Left Kidney: Renal measurements: 12 x 7.1 x 5.5 c

## 2021-12-08 DIAGNOSIS — E876 Hypokalemia: Secondary | ICD-10-CM

## 2021-12-08 LAB — CBC WITH DIFFERENTIAL/PLATELET
Basophils Absolute: 0 10*3/uL (ref 0.0–0.1)
Basophils Relative: 0 %
Eosinophils Absolute: 0.1 10*3/uL (ref 0.0–0.5)
Eosinophils Relative: 1 %
HCT: 30.8 % — ABNORMAL LOW (ref 36.0–46.0)
Hemoglobin: 10.2 g/dL — ABNORMAL LOW (ref 12.0–15.0)
Lymphocytes Relative: 7 %
Lymphs Abs: 0.7 10*3/uL (ref 0.7–4.0)
MCH: 30 pg (ref 26.0–34.0)
MCHC: 33.1 g/dL (ref 30.0–36.0)
MCV: 90.6 fL (ref 80.0–100.0)
Metamyelocytes Relative: 3 %
Monocytes Absolute: 1 10*3/uL (ref 0.1–1.0)
Monocytes Relative: 10 %
Neutro Abs: 8.1 10*3/uL — ABNORMAL HIGH (ref 1.7–7.7)
Neutrophils Relative %: 78 %
Platelets: 244 10*3/uL (ref 150–400)
Promyelocytes Relative: 1 %
RBC: 3.4 MIL/uL — ABNORMAL LOW (ref 3.87–5.11)
RDW: 15.2 % (ref 11.5–15.5)
WBC: 10.4 10*3/uL (ref 4.0–10.5)
nRBC: 0 % (ref 0.0–0.2)

## 2021-12-08 LAB — BASIC METABOLIC PANEL
Anion gap: 10 (ref 5–15)
BUN: 32 mg/dL — ABNORMAL HIGH (ref 8–23)
CO2: 20 mmol/L — ABNORMAL LOW (ref 22–32)
Calcium: 7 mg/dL — ABNORMAL LOW (ref 8.9–10.3)
Chloride: 107 mmol/L (ref 98–111)
Creatinine, Ser: 1.82 mg/dL — ABNORMAL HIGH (ref 0.44–1.00)
GFR, Estimated: 27 mL/min — ABNORMAL LOW (ref >60)
Glucose, Bld: 93 mg/dL (ref 70–99)
Potassium: 3.7 mmol/L (ref 3.5–5.1)
Sodium: 137 mmol/L (ref 135–145)

## 2021-12-08 LAB — MAGNESIUM: Magnesium: 1.2 mg/dL — ABNORMAL LOW (ref 1.7–2.4)

## 2021-12-08 MED ORDER — OMEPRAZOLE 20 MG PO CPDR: 20.0000 mg | DELAYED_RELEASE_CAPSULE | Freq: Every day | ORAL | Status: AC

## 2021-12-08 MED ORDER — POTASSIUM CHLORIDE CRYS ER 20 MEQ PO TBCR
40.0000 meq | EXTENDED_RELEASE_TABLET | Freq: Once | ORAL | Status: AC
  Administered 2021-12-08: 40 meq via ORAL
  Filled 2021-12-08: qty 2

## 2021-12-08 MED ORDER — GABAPENTIN 300 MG PO CAPS
300.0000 mg | ORAL_CAPSULE | Freq: Every day | ORAL | Status: AC
Start: 2021-12-08 — End: ?

## 2021-12-08 MED ORDER — CEFDINIR 300 MG PO CAPS: 300.0000 mg | ORAL_CAPSULE | Freq: Every day | ORAL | 0 refills | Status: AC

## 2021-12-08 MED ORDER — MAGNESIUM SULFATE 2 GM/50ML IV SOLN
2.0000 g | Freq: Once | INTRAVENOUS | Status: AC
  Administered 2021-12-08: 2 g via INTRAVENOUS
  Filled 2021-12-08: qty 50

## 2021-12-08 NOTE — Progress Notes (Signed)
Patient discharged to home, AVS reviewed. Prescriptions sent to her pharmacy. IV removed. Assisted to exit via wheelchair, family provided transportation.  ?

## 2021-12-08 NOTE — TOC Transition Note (Signed)
Transition of Care (TOC) - CM/SW Discharge Note ? ? ?Patient Details  ?Name: Tricia Jenkins ?MRN: [id number] ?Date of Birth: 02-26-1941 ? ?Transition [facility]) CM/SW Contact:  ?Joaquin Courts, RN ?Phone Number: ?12/08/2021, 1:00 PM ? ? ?Clinical Narrative:    ?CM noted HHPT orders and TOC consult.  CM spoke with patient who declines [place] services, reports she feels she is able to manage her needs in the home and does not want PT services at this time.  CM explained that should patient change her mind post discharge, she would need to work with her PCP office to set up services.  No further TOC needs identified.  ? ? ?Final next level of care: Home/Self Care ?Barriers to Discharge: No Barriers Identified ? ? ?Patient Goals and CMS Choice ?Patient states their goals for this hospitalization and ongoing recovery are:: to return home today ?CMS Medicare.gov Compare Post Acute Care list provided to:: Patient ?Choice offered to / list presented to : Patient ? ?Discharge Placement ?  ?           ?  ?  ?  ?  ? ?Discharge Plan and Services ?  ?Discharge Planning Services: CM Consult ?Post Acute Care Choice: Home Health          ?DME Arranged: N/A ?  ?  ?  ?  ?HH Arranged: Patient Refused HH ?  ?  ?  ?  ? ?Social Determinants of Health (SDOH) Interventions ?  ? ? ?Readmission Risk Interventions ?No flowsheet data found. ? ? ? ? ?

## 2021-12-08 NOTE — Discharge Summary (Signed)
Physician Discharge Summary  Tricia Jenkins ZOX:[id number] DOB: 1940/12/29 DOA: 12/04/2021  PCP: Carylon Perches, MD  Admit date: 12/04/2021 Discharge date: 12/08/2021  Admitted From:  HOME  Disposition: HOME WITH HH   Recommendations for Outpatient Follow-up:  Follow up with PCP in 1 weeks Please obtain BMP/CBC in one week Please follow up on the following pending results:  Home Health:  PT   Discharge Condition: STABLE   CODE STATUS: PARTIAL  DIET: RESUME HOME DIET HEART HEALTHY   Brief Hospitalization Summary: Please see all hospital notes, images, labs for full details of the hospitalization.  81 y.o. Caucasian female with medical history significant for GERD, hypertension and osteoarthritis with as CKD 3b, who presented to the emergency room with acute onset of lower abdominal pain as well as of left flank pain with diminished p.o. intake and associated nausea.  She has been having dysuria and difficulty urinating.  She admits to mild cough without wheezing or dyspnea.  She denies any fever or chills.  No chest pain or dyspnea or palpitations.   ED Course: When the patient came to the ER, heart rate was 94 vital signs were within normal.  Labs revealed mild hyponatremia and hypochloremia with a BUN of 39 and creatinine of 2.87 above previous levels.  CBC showed leukocytosis of 14.8 and platelets of 135.  Influenza antigens and COVID-19 PCR came back negative.  UA was positive for UTI.   Imaging: Abdominal and pelvic CT scan showed mild distention of the left renal pelvis with left ureter and surrounding fat stranding, findings that could reflect recent passage of urinary tract calculus or underlying infection/pyelonephritis.  It showed aortic atherosclerosis and no radiopaque calculi.  The patient was given a gram of IV Rocephin and 1 L bolus of IV normal saline.  She will be admitted to a telemetry bed for further evaluation and management.  12/06/2021: Patient reports that she continues to  feel weak.  She still has some left flank pain as well.  She denies fever and chills.  Blood cultures no growth to date.  12/07/2021:  Pt slowly starting to ambulate more. Flank pain improving.  No fever.  Starting to eat again.  12/08/2021:  Pt reports that she is feeling a lot better and wants to go home.  She has no further flank pain, no fever, no chills and tolerating diet well.      HOSPITAL COURSE BY PROBLEM LIST   Assessment and Plan: * Pyelonephritis of left kidney - Treated with IV normal saline. - Treated with IV Rocephin. - We will follow blood and urine cultures. No growth to date.   Acute kidney injury superimposed on chronic kidney disease (HCC) - The patient will be hydrated with IV normal saline with good results. - creatinine trending down nicely  Lab Results  Component Value Date   CREATININE 1.82 (H) 12/08/2021   CREATININE 2.51 (H) 12/07/2021   CREATININE 2.73 (H) 12/06/2021    Hypomagnesemia Additional IV replacement given prior to discharge.  Follow up with PCP.   GERD without esophagitis - treated with PPI therapy.  Essential hypertension - We will continue Norvasc and hold off HCTZ given acute kidney injury.   Generalized abdominal pain No abdominal pain today.    Sepsis RULED OUT - Treated with supportive measures and treatments.   GERD Protonix ordered for GI protection.   Hypokalemia Repleted.     Discharge Diagnoses:  Principal Problem:   Pyelonephritis of left kidney Active Problems:  Acute kidney injury superimposed on chronic kidney disease (HCC)   Essential hypertension   GERD without esophagitis   Hypomagnesemia   GERD   Sepsis RULED OUT   Generalized abdominal pain   Hypokalemia   Discharge Instructions:  Allergies as of 12/08/2021       Reactions   Naproxen    Swelling or itching?   Piroxicam    Swelling or itching?        Medication List     STOP taking these medications    hydrochlorothiazide 25 MG  tablet Commonly known as: HYDRODIURIL   oxyCODONE 5 MG immediate release tablet Commonly known as: Roxicodone   potassium chloride SA 20 MEQ tablet Commonly known as: KLOR-CON M       TAKE these medications    amLODipine 5 MG tablet Commonly known as: NORVASC Take 5 mg by mouth daily.   cefdinir 300 MG capsule Commonly known as: OMNICEF Take 1 capsule (300 mg total) by mouth daily for 10 days. Start taking on: December 09, 2021   gabapentin 300 MG capsule Commonly known as: NEURONTIN Take 1 capsule (300 mg total) by mouth at bedtime. What changed: when to take this   omeprazole 20 MG capsule Commonly known as: PRILOSEC Take 1 capsule (20 mg total) by mouth daily.        Follow-up Information     Carylon Perches, MD. Schedule an appointment as soon as possible for a visit in 1 week(s).   Specialty: Internal Medicine Why: Hospital Follow Up Contact information: [address] [place] [address] [phone]                Allergies  Allergen Reactions   Naproxen     Swelling or itching?   Piroxicam     Swelling or itching?   Allergies as of 12/08/2021       Reactions   Naproxen    Swelling or itching?   Piroxicam    Swelling or itching?        Medication List     STOP taking these medications    hydrochlorothiazide 25 MG tablet Commonly known as: HYDRODIURIL   oxyCODONE 5 MG immediate release tablet Commonly known as: Roxicodone   potassium chloride SA 20 MEQ tablet Commonly known as: KLOR-CON M       TAKE these medications    amLODipine 5 MG tablet Commonly known as: NORVASC Take 5 mg by mouth daily.   cefdinir 300 MG capsule Commonly known as: OMNICEF Take 1 capsule (300 mg total) by mouth daily for 10 days. Start taking on: December 09, 2021   gabapentin 300 MG capsule Commonly known as: NEURONTIN Take 1 capsule (300 mg total) by mouth at bedtime. What changed: when to take this   omeprazole 20 MG  capsule Commonly known as: PRILOSEC Take 1 capsule (20 mg total) by mouth daily.        Procedures/Studies: CT Abdomen Pelvis Wo Contrast  Result Date: 12/04/2021 CLINICAL DATA:  Nausea and vomiting, abdominal pain EXAM: CT ABDOMEN AND PELVIS WITHOUT CONTRAST TECHNIQUE: Multidetector CT imaging of the abdomen and pelvis was performed following the standard protocol without IV contrast. RADIATION DOSE REDUCTION: This exam was performed according to the departmental dose-optimization program which includes automated exposure control, adjustment of the mA and/or kV according to patient size and/or use of iterative reconstruction technique. COMPARISON:  None. FINDINGS: Lower chest: No acute pleural or parenchymal lung disease. Scattered hypoventilatory changes at the lung bases. Postsurgical  changes within the lower left breast. Hepatobiliary: Unremarkable unenhanced appearance of the liver and gallbladder. Pancreas: Unremarkable unenhanced appearance. Spleen: Unremarkable unenhanced appearance. Adrenals/Urinary Tract: Mild right renal cortical atrophy. No right-sided calculi or obstructive uropathy. 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 Mild dilation of the left renal pelvis and portions of the left ureter. No radiopaque urinary tract calculi. Mild fat stranding surrounding the left kidney and left ureter, which could reflect infection or recent passage of a urinary tract calculus. The adrenals and bladder are unremarkable. Stomach/Bowel: No bowel obstruction or ileus. The appendix is surgically absent. No bowel wall thickening or inflammatory change. Vascular/Lymphatic: Aortic atherosclerosis. No enlarged abdominal or pelvic lymph nodes. Reproductive: Uterus and bilateral adnexa are unremarkable. Other: No free fluid or free gas.  No abdominal wall hernia. Musculoskeletal: No acute or destructive bony lesions. Reconstructed images demonstrate no  additional findings. IMPRESSION: 1. Mild distension of the left renal pelvis and left ureter, with surrounding fat stranding. Findings could reflect recent passage of urinary tract calculus or underlying infection. Please correlate with urinalysis. 2. No radiopaque urinary tract calculi. 3.  Aortic Atherosclerosis (ICD10-I70.0). Electronically Signed   By: Sharlet Salina M.D.   On: 12/04/2021 23:53   US RENAL  Result Date: 12/07/2021 CLINICAL DATA:  Abdominal pain abnormality is seen in the left kidney in the previous CT EXAM: RENAL / URINARY TRACT ULTRASOUND COMPLETE COMPARISON:  CT done on 12/04/2021 FINDINGS: Right Kidney: Renal measurements: 8.9 x 5.3 x 4.9 cm = volume: 119.9 mL. There is no hydronephrosis. There are foci of cortical thinning. Left Kidney: Renal measurements: 12 x 7.1 x 5.5 cm = volume: 248.8 mL. Echogenicity within normal limits. No mass or hydronephrosis visualized. Bladder: Appears normal for degree of bladder distention. Other: None. IMPRESSION: There is no hydronephrosis. Electronically Signed   By: Ernie Avena M.D.   On: 12/07/2021 10:58     Subjective: Pt reports she feels much better and wants to go home.    Discharge Exam: Vitals:   12/07/21 2133 12/08/21 0536  BP: (!) 143/55 (!) 123/58  Pulse: 69 74  Resp: 16 16  Temp: 98.8 F (37.1 C) 98.6 F (37 C)  SpO2: 93% 96%   Vitals:   12/06/21 2141 12/07/21 1500 12/07/21 2133 12/08/21 0536  BP: (!) 131/57 131/61 (!) 143/55 (!) 123/58  Pulse: 73 74 69 74  Resp: 18 16 16 16   Temp: 98.6 F (37 C) 97.6 F (36.4 C) 98.8 F (37.1 C) 98.6 F (37 C)  TempSrc:  Oral Oral Oral  SpO2: 92% 96% 93% 96%  Weight:      Height:       General: Pt is alert, awake, not in acute distress Cardiovascular: RRR, S1/S2 +, no rubs, no gallops Respiratory: CTA bilaterally, no wheezing, no rhonchi Abdominal: Soft, NT, ND, bowel sounds + Extremities: no edema, no cyanosis   The results of significant diagnostics from this  hospitalization (including imaging, microbiology, ancillary and laboratory) are listed below for reference.     Microbiology: Recent Results (from the past 240 hour(s))  Resp Panel by RT-PCR (Flu A&B, Covid) Nasopharyngeal Swab     Status: None   Collection Time: 12/04/21  9:22 PM   Specimen: Nasopharyngeal Swab; Nasopharyngeal(NP) swabs in vial transport medium  Result Value Ref Range Status   SARS Coronavirus 2 by RT PCR NEGATIVE NEGATIVE Final    Comment: (NOTE) SARS-CoV-2 target nucleic acids are NOT DETECTED.  The SARS-CoV-2 RNA is generally detectable in upper respiratory specimens during the acute phase of infection. The lowest concentration of SARS-CoV-2 viral copies this assay can detect is 138 copies/mL. A negative result does not preclude SARS-Cov-2 infection and should not be used as the sole basis for treatment or other patient management decisions. A negative result may occur with  improper specimen collection/handling, submission of specimen other than nasopharyngeal swab, presence of viral mutation(s) within the areas targeted by this assay, and inadequate number of viral copies(<138 copies/mL). A negative result must be combined with clinical observations, patient history, and epidemiological information. The expected result is Negative.  Fact Sheet for Patients:  BloggerCourse.com  Fact Sheet for Healthcare Providers:  SeriousBroker.it  This test is no t yet approved or cleared by the [place] FDA and  has been authorized for detection and/or diagnosis of SARS-CoV-2 by FDA under an Emergency Use Authorization (EUA). This EUA will remain  in effect (meaning this test can be used) for the duration of the COVID-19 declaration under Section 564(b)(1) of the Act, 21 U.S.C.section 360bbb-3(b)(1), unless the authorization is terminated  or revoked sooner.       Influenza A by PCR NEGATIVE NEGATIVE Final    Influenza B by PCR NEGATIVE NEGATIVE Final    Comment: (NOTE) The Xpert Xpress SARS-CoV-2/FLU/RSV plus assay is intended as an aid in the diagnosis of influenza from Nasopharyngeal swab specimens and should not be used as a sole basis for treatment. Nasal washings and aspirates are unacceptable for Xpert Xpress SARS-CoV-2/FLU/RSV testing.  Fact Sheet for Patients: BloggerCourse.com  Fact Sheet for Healthcare Providers: SeriousBroker.it  This test is not yet approved or cleared by the [place] FDA and has been authorized for detection and/or diagnosis of SARS-CoV-2 by FDA under an Emergency Use Authorization (EUA). This EUA will remain in effect (meaning this test can be used) for the duration of the COVID-19 declaration under Section 564(b)(1) of the Act, 21 U.S.C. section 360bbb-3(b)(1), unless the authorization is terminated or revoked.  Performed at [facility], [address] [place], [place] [address]      Labs: BNP (last 3 results) No results for input(s): BNP in the last 8760 hours. Basic Metabolic Panel: Recent Labs  Lab 12/04/21 1759 12/05/21 0341 12/06/21 0549 12/07/21 0508 12/08/21 0804  NA 134* 132* 133* 134* 137  K 3.8 3.8 3.2* 3.4* 3.7  CL 96* 99 102 102 107  CO2 24 22 20* 20* 20*  GLUCOSE 128* 94 100* 99 93  BUN 39* 39* 44* 43* 32*  CREATININE 2.87* 2.76* 2.73* 2.51* 1.82*  CALCIUM 7.7* 6.8* 6.7* 6.9* 7.0*  MG  --   --  1.2*  --  1.2*   Liver Function Tests: Recent Labs  Lab 12/04/21 1759  AST 39  ALT 21  ALKPHOS 96  BILITOT 1.0  PROT 7.0  ALBUMIN 3.2*   Recent Labs  Lab 12/04/21 1759  LIPASE 23   No results for input(s): AMMONIA in the last 168 hours. CBC: Recent Labs  Lab 12/04/21 1759 12/05/21 0341 12/06/21 0549 12/08/21 0804  WBC 14.8* 13.4* 11.9* 10.4  NEUTROABS  --   --  9.7* 8.1*  HGB 12.0 9.9* 9.7* 10.2*  HCT 36.7 30.3* 29.6* 30.8*  MCV 88.0 89.1 89.2 90.6  PLT  135* 120* 151 244   Cardiac Enzymes: No results for input(s): CKTOTAL, CKMB, CKMBINDEX, TROPONINI in the last 168 hours. BNP: Invalid input(s): POCBNP CBG: No results for input(s): GLUCAP in the last  168 hours. D-Dimer No results for input(s): DDIMER in the last 72 hours. Hgb A1c No results for input(s): HGBA1C in the last 72 hours. Lipid Profile No results for input(s): CHOL, HDL, LDLCALC, TRIG, CHOLHDL, LDLDIRECT in the last 72 hours. Thyroid function studies No results for input(s): TSH, T4TOTAL, T3FREE, THYROIDAB in the last 72 hours.  Invalid input(s): FREET3 Anemia work up No results for input(s): VITAMINB12, FOLATE, FERRITIN, TIBC, IRON, RETICCTPCT in the last 72 hours. Urinalysis    Component Value Date/Time   COLORURINE YELLOW 12/05/2021 0120   APPEARANCEUR CLOUDY (A) 12/05/2021 0120   LABSPEC 1.015 12/05/2021 0120   PHURINE 5.0 12/05/2021 0120   GLUCOSEU NEGATIVE 12/05/2021 0120   HGBUR MODERATE (A) 12/05/2021 0120   BILIRUBINUR NEGATIVE 12/05/2021 0120   KETONESUR 5 (A) 12/05/2021 0120   PROTEINUR 100 (A) 12/05/2021 0120   NITRITE NEGATIVE 12/05/2021 0120   LEUKOCYTESUR LARGE (A) 12/05/2021 0120   Sepsis Labs Invalid input(s): PROCALCITONIN,  WBC,  LACTICIDVEN Microbiology Recent Results (from the past 240 hour(s))  Resp Panel by RT-PCR (Flu A&B, Covid) Nasopharyngeal Swab     Status: None   Collection Time: 12/04/21  9:22 PM   Specimen: Nasopharyngeal Swab; Nasopharyngeal(NP) swabs in vial transport medium  Result Value Ref Range Status   SARS Coronavirus 2 by RT PCR NEGATIVE NEGATIVE Final    Comment: (NOTE) SARS-CoV-2 target nucleic acids are NOT DETECTED.  The SARS-CoV-2 RNA is generally detectable in upper respiratory specimens during the acute phase of infection. The lowest concentration of SARS-CoV-2 viral copies this assay can detect is 138 copies/mL. A negative result does not preclude SARS-Cov-2 infection and should not be used as the sole basis  for treatment or other patient management decisions. A negative result may occur with  improper specimen collection/handling, submission of specimen other than nasopharyngeal swab, presence of viral mutation(s) within the areas targeted by this assay, and inadequate number of viral copies(<138 copies/mL). A negative result must be combined with clinical observations, patient history, and epidemiological information. The expected result is Negative.  Fact Sheet for Patients:  BloggerCourse.com  Fact Sheet for Healthcare Providers:  SeriousBroker.it  This test is no t yet approved or cleared by the [place] FDA and  has been authorized for detection and/or diagnosis of SARS-CoV-2 by FDA under an Emergency Use Authorization (EUA). This EUA will remain  in effect (meaning this test can be used) for the duration of the COVID-19 declaration under Section 564(b)(1) of the Act, 21 U.S.C.section 360bbb-3(b)(1), unless the authorization is terminated  or revoked sooner.       Influenza A by PCR NEGATIVE NEGATIVE Final   Influenza B by PCR NEGATIVE NEGATIVE Final    Comment: (NOTE) The Xpert Xpress SARS-CoV-2/FLU/RSV plus assay is intended as an aid in the diagnosis of influenza from Nasopharyngeal swab specimens and should not be used as a sole basis for treatment. Nasal washings and aspirates are unacceptable for Xpert Xpress SARS-CoV-2/FLU/RSV testing.  Fact Sheet for Patients: BloggerCourse.com  Fact Sheet for Healthcare Providers: SeriousBroker.it  This test is not yet approved or cleared by the [place] FDA and has been authorized for detection and/or diagnosis of SARS-CoV-2 by FDA under an Emergency Use Authorization (EUA). This EUA will remain in effect (meaning this test can be used) for the duration of the COVID-19 declaration under Section 564(b)(1) of the Act, 21  U.S.C. section 360bbb-3(b)(1), unless the authorization is terminated or revoked.  Performed at [facility], [address], [place], [place]  [address]    Time coordinating discharge: 34 mins   SIGNED:  Standley Dakins, MD  [facility] 12/08/2021, 11:19 AM How to contact the [facility] Attending or Consulting provider 7A - 7P or covering provider during after hours 7P -7A, for this patient?  Check the care team in [facility] and look for a) attending/consulting [facility] provider listed and b) the [facility] team listed Log into [email or web address] and use [facility] universal password to access. If you do not have the password, please contact the hospital operator. Locate the [facility] provider you are looking for under [facility] and page to a number that you can be directly reached. If you still have difficulty reaching the provider, please page the [facility] (Director on Call) for the Hospitalists listed on amion for assistance.

## 2021-12-08 NOTE — Discharge Instructions (Signed)
IMPORTANT INFORMATION: PAY CLOSE ATTENTION   PHYSICIAN DISCHARGE INSTRUCTIONS  Follow with Primary care provider  Fagan, Roy, MD  and other consultants as instructed by your Hospitalist Physician  SEEK MEDICAL CARE OR RETURN TO EMERGENCY ROOM IF SYMPTOMS COME BACK, WORSEN OR NEW PROBLEM DEVELOPS   Please note: You were cared for by a hospitalist during your hospital stay. Every effort will be made to forward records to your primary care provider.  You can request that your primary care provider send for your hospital records if they have not received them.  Once you are discharged, your primary care physician will handle any further medical issues. Please note that NO REFILLS for any discharge medications will be authorized once you are discharged, as it is imperative that you return to your primary care physician (or establish a relationship with a primary care physician if you do not have one) for your post hospital discharge needs so that they can reassess your need for medications and monitor your lab values.  Please get a complete blood count and chemistry panel checked by your Primary MD at your next visit, and again as instructed by your Primary MD.  Get Medicines reviewed and adjusted: Please take all your medications with you for your next visit with your Primary MD  Laboratory/radiological data: Please request your Primary MD to go over all hospital tests and procedure/radiological results at the follow up, please ask your primary care provider to get all Hospital records sent to his/her office.  In some cases, they will be blood work, cultures and biopsy results pending at the time of your discharge. Please request that your primary care provider follow up on these results.  If you are diabetic, please bring your blood sugar readings with you to your follow up appointment with primary care.    Please call and make your follow up appointments as soon as possible.    Also Note the  following: If you experience worsening of your admission symptoms, develop shortness of breath, life threatening emergency, suicidal or homicidal thoughts you must seek medical attention immediately by calling 911 or calling your MD immediately  if symptoms less severe.  You must read complete instructions/literature along with all the possible adverse reactions/side effects for all the Medicines you take and that have been prescribed to you. Take any new Medicines after you have completely understood and accpet all the possible adverse reactions/side effects.   Do not drive when taking Pain medications or sleeping medications (Benzodiazepines)  Do not take more than prescribed Pain, Sleep and Anxiety Medications. It is not advisable to combine anxiety,sleep and pain medications without talking with your primary care practitioner  Special Instructions: If you have smoked or chewed Tobacco  in the last 2 yrs please stop smoking, stop any regular Alcohol  and or any Recreational drug use.  Wear Seat belts while driving.  Do not drive if taking any narcotic, mind altering or controlled substances or recreational drugs or alcohol.       

## 2021-12-08 NOTE — Assessment & Plan Note (Signed)
Additional IV replacement given prior to discharge.  Follow up with PCP.  ?

## 2021-12-10 DIAGNOSIS — I700 Atherosclerosis of aorta: Secondary | ICD-10-CM | POA: Diagnosis not present

## 2021-12-10 DIAGNOSIS — N10 Acute tubulo-interstitial nephritis: Secondary | ICD-10-CM | POA: Diagnosis not present

## 2021-12-10 DIAGNOSIS — D696 Thrombocytopenia, unspecified: Secondary | ICD-10-CM | POA: Diagnosis not present

## 2021-12-10 DIAGNOSIS — N172 Acute kidney failure with medullary necrosis: Secondary | ICD-10-CM | POA: Diagnosis not present

## 2021-12-15 ENCOUNTER — Emergency Department (HOSPITAL_COMMUNITY)
Admission: EM | Admit: 2021-12-15 | Discharge: 2021-12-15 | Disposition: A | Discharge status: Home / Self Care | Payer: Medicare Other | Attending: Emergency Medicine | Admitting: Emergency Medicine

## 2021-12-15 ENCOUNTER — Encounter (HOSPITAL_COMMUNITY): Payer: Self-pay

## 2021-12-15 ENCOUNTER — Other Ambulatory Visit: Payer: Self-pay

## 2021-12-15 ENCOUNTER — Emergency Department (HOSPITAL_COMMUNITY): Payer: Medicare Other

## 2021-12-15 DIAGNOSIS — M25511 Pain in right shoulder: Secondary | ICD-10-CM | POA: Diagnosis not present

## 2021-12-15 DIAGNOSIS — M4316 Spondylolisthesis, lumbar region: Secondary | ICD-10-CM | POA: Diagnosis not present

## 2021-12-15 DIAGNOSIS — R1084 Generalized abdominal pain: Secondary | ICD-10-CM | POA: Diagnosis not present

## 2021-12-15 DIAGNOSIS — M546 Pain in thoracic spine: Secondary | ICD-10-CM | POA: Diagnosis not present

## 2021-12-15 DIAGNOSIS — M5136 Other intervertebral disc degeneration, lumbar region: Secondary | ICD-10-CM | POA: Diagnosis not present

## 2021-12-15 DIAGNOSIS — S32000A Wedge compression fracture of unspecified lumbar vertebra, initial encounter for closed fracture: Secondary | ICD-10-CM | POA: Diagnosis not present

## 2021-12-15 DIAGNOSIS — M545 Low back pain, unspecified: Secondary | ICD-10-CM | POA: Diagnosis not present

## 2021-12-15 DIAGNOSIS — M47812 Spondylosis without myelopathy or radiculopathy, cervical region: Secondary | ICD-10-CM | POA: Diagnosis not present

## 2021-12-15 DIAGNOSIS — M2578 Osteophyte, vertebrae: Secondary | ICD-10-CM | POA: Diagnosis not present

## 2021-12-15 DIAGNOSIS — M549 Dorsalgia, unspecified: Secondary | ICD-10-CM

## 2021-12-15 DIAGNOSIS — M5137 Other intervertebral disc degeneration, lumbosacral region: Secondary | ICD-10-CM | POA: Diagnosis not present

## 2021-12-15 MED ORDER — CYCLOBENZAPRINE HCL 10 MG PO TABS: 10.0000 mg | ORAL_TABLET | Freq: Two times a day (BID) | ORAL | 0 refills | Status: DC | PRN

## 2021-12-15 MED ORDER — CYCLOBENZAPRINE HCL 10 MG PO TABS
10.0000 mg | ORAL_TABLET | Freq: Two times a day (BID) | ORAL | 0 refills | Status: DC | PRN
Start: 2021-12-15 — End: 2021-12-15

## 2021-12-15 MED ORDER — CYCLOBENZAPRINE HCL 10 MG PO TABS
10.0000 mg | ORAL_TABLET | Freq: Once | ORAL | Status: AC
  Administered 2021-12-15: 10 mg via ORAL
  Filled 2021-12-15: qty 1

## 2021-12-15 NOTE — ED Provider Notes (Signed)
?[facility] ?Provider Note ? ? ?CSN: [id number] ?Arrival date & time: 12/15/21  1011 ? ?  ? ?History ? ?Chief Complaint  ?Patient presents with  ? Back Pain  ? ? ?Tricia Jenkins is a 81 y.o. female. ? ?HPI ? ?Patient without significant medical history presents with complaints of shoulder and back pain.  Patient states pain started suddenly yesterday, states that she was laying in her recliner and fell asleep upon waking up she went to reach for a class and then started to feel pain on her right shoulder.  Pain is worsened with movement improved with rest, she denies any paresthesia or weakness in her arms, she denies any recent falls, not on anticoag's, she has no associated headaches change in vision paresthesia in the upper or lower extremities.  She has not had anything for pain. ? ?I reviewed patient's note patient recent discharge from the hospital 10 days ago she was hospitalized for pyelonephritis. ? ?Home Medications ?Prior to Admission medications   ?Medication Sig Start Date End Date Taking? Authorizing Provider  ?cyclobenzaprine (FLEXERIL) 10 MG tablet Take 1 tablet (10 mg total) by mouth 2 (two) times daily as needed for muscle spasms. 12/15/21  Yes Marcello Fennel, PA-C  ?amLODipine (NORVASC) 5 MG tablet Take 5 mg by mouth daily.      [provider]  ?cefdinir (OMNICEF) 300 MG capsule Take 1 capsule (300 mg total) by mouth daily for 10 days. 12/09/21 12/19/21  Murlean Iba, MD  ?gabapentin (NEURONTIN) 300 MG capsule Take 1 capsule (300 mg total) by mouth at bedtime. 12/08/21   Johnson, Clanford L, MD  ?omeprazole (PRILOSEC) 20 MG capsule Take 1 capsule (20 mg total) by mouth daily. 12/08/21   Murlean Iba, MD  ?   ? ?Allergies    ?Naproxen and Piroxicam   ? ?Review of Systems   ?Review of Systems  ?Constitutional:  Negative for chills and fever.  ?Respiratory:  Negative for shortness of breath.   ?Cardiovascular:  Negative for chest pain.  ?Gastrointestinal:   Negative for abdominal pain.  ?Musculoskeletal:  Positive for back pain.  ?     Right shoulder pain  ?Neurological:  Negative for headaches.  ? ?Physical Exam ?Updated Vital Signs ?BP 132/68 (BP Location: Right Arm)   Pulse 76   Temp 98.4 ?F (36.9 ?C) (Oral)   Resp 18   Ht 5' 5.5" (1.664 m)   Wt 84 kg   SpO2 99%   BMI 30.35 kg/m?  ?Physical Exam ?Vitals and nursing note reviewed.  ?Constitutional:   ?   General: She is not in acute distress. ?   Appearance: She is not ill-appearing.  ?HENT:  ?   Head: Normocephalic and atraumatic.  ?   Comments: No deformity of the head present no raccoon eyes about sign noted. ?   Nose: No congestion.  ?   Mouth/Throat:  ?   Comments: No trismus no torticollis no oral trauma ?Eyes:  ?   Extraocular Movements: Extraocular movements intact.  ?   Conjunctiva/sclera: Conjunctivae normal.  ?   Pupils: Pupils are equal, round, and reactive to light.  ?Cardiovascular:  ?   Rate and Rhythm: Normal rate and regular rhythm.  ?   Pulses: Normal pulses.  ?   Heart sounds: No murmur heard. ?  No friction rub. No gallop.  ?Pulmonary:  ?   Effort: No respiratory distress.  ?   Breath sounds: No wheezing, rhonchi or rales.  ?  Abdominal:  ?   Palpations: Abdomen is soft.  ?   Tenderness: There is no abdominal tenderness. There is no right CVA tenderness or left CVA tenderness.  ?Musculoskeletal:  ?   Cervical back: No tenderness.  ?   Comments: Spine was palpated she had no tenderness along her cervical spine as well as her lumbar spine no step-off presents no overlying skin changes.  Patient had full range of motion in her upper or lower extremities neurovascular fully intact 2+ radial and dorsalis pedal pulses.  Humeral head clavicle and scapula are all nontender during my examination.  ?Skin: ?   General: Skin is warm and dry.  ?Neurological:  ?   Mental Status: She is alert.  ?   Comments: No facial asymmetry no difficulty word finding, following two-step commands, no weakness present.   ?Psychiatric:     ?   Mood and Affect: Mood normal.  ? ? ?ED Results / Procedures / Treatments   ?Labs ?(all labs ordered are listed, but only abnormal results are displayed) ?Labs Reviewed - No data to display ? ?EKG ?None ? ?Radiology ?CT Cervical Spine Wo Contrast ? ?Result Date: 12/15/2021 ?CLINICAL DATA:  Neck pain, fracture EXAM: CT CERVICAL SPINE WITHOUT CONTRAST TECHNIQUE: Multidetector CT imaging of the cervical spine was performed without intravenous contrast. Multiplanar CT image reconstructions were also generated. RADIATION DOSE REDUCTION: This exam was performed according to the departmental dose-optimization program which includes automated exposure control, adjustment of the mA and/or kV according to patient size and/or use of iterative reconstruction technique. COMPARISON:  Cervical spine x-ray 12/29/2016 FINDINGS: Alignment: No subluxation. Reversal of the normal lordotic curvature centered at C3. Skull base and vertebrae: No acute fracture. No primary bone lesion or focal pathologic process. Soft tissues and spinal canal: No acute abnormality identified. Chronic calcific densities noted in the soft tissues adjacent to the spinous processes at C6 and C7. Disc levels: Moderate intervertebral disc space narrowing from C3 through C6. Associated small uncovertebral spurring and dorsal endplate osteophytes. Upper chest: No acute process visualized. Other: 3.3 x 3 cm left thyroid nodule. IMPRESSION: 1. No acute fracture or malalignment identified in the cervical spine. Degenerative changes. 2. 3.3 cm left thyroid lobe nodule, recommend follow-up ultrasound. Electronically Signed   By: Ofilia Neas M.D.   On: 12/15/2021 12:40  ? ?CT Lumbar Spine Wo Contrast ? ?Result Date: 12/15/2021 ?CLINICAL DATA:  Compression fracture EXAM: CT LUMBAR SPINE WITHOUT CONTRAST TECHNIQUE: Multidetector CT imaging of the lumbar spine was performed without intravenous contrast administration. Multiplanar CT image  reconstructions were also generated. RADIATION DOSE REDUCTION: This exam was performed according to the departmental dose-optimization program which includes automated exposure control, adjustment of the mA and/or kV according to patient size and/or use of iterative reconstruction technique. COMPARISON:  Abdominal CT from 11 days ago FINDINGS: Segmentation: 5 lumbar type vertebrae. Alignment: Borderline L2-3 anterolisthesis Vertebrae: No acute fracture or focal pathologic process. Generalized osteopenia Paraspinal and other soft tissues: Negative. Disc levels: Mild degenerative facet spurring. Focal degenerative disc narrowing at L5-S1. no visible impingement IMPRESSION: No acute or focal finding. Electronically Signed   By: Jorje Guild M.D.   On: 12/15/2021 12:21   ? ?Procedures ?Procedures  ? ? ?Medications Ordered in ED ?Medications  ?cyclobenzaprine (FLEXERIL) tablet 10 mg (10 mg Oral Given 12/15/21 1147)  ? ? ?ED Course/ Medical Decision Making/ A&P ?  ?                        ?  Medical Decision Making ?Amount and/or Complexity of Data Reviewed ?Radiology: ordered. ? ?Risk ?Prescription drug management. ? ? ?This patient presents to the ED for concern of back pain, this involves an extensive number of treatment options, and is a complaint that carries with it a high risk of complications and morbidity.  The differential diagnosis includes fracture, dislocation, spine equina ? ? ? ?Additional history obtained: ? ?Additional history obtained from electronic medical record ?External records from outside source obtained and reviewed including please see HPI ? ? ?Co morbidities that complicate the patient evaluation ? ?N/A ? ?Social Determinants of Health: ? ?Geriatric ? ? ? ?Lab Tests: ? ?I Ordered, and personally interpreted labs.  The pertinent results include: N/A ? ? ?Imaging Studies ordered: ? ?I ordered imaging studies including CT C-spine and CT lumbar spine ?I independently visualized and interpreted  imaging which showed imaging negative for acute findings, does show a small nodule in the thyroid. ?I agree with the radiologist interpretation ? ? ?Cardiac Monitoring: ? ?The patient was maintained on a cardiac monitor.

## 2021-12-15 NOTE — ED Triage Notes (Signed)
"  Was hospitalized a few weeks ago, since then I have been sitting/ laying around a lot. Yesterday evening I went to reach for my drink and felt something pull in top of my back, and pain shoots up to neck and right shoulder. Since then it feels really stiff and hurts when I move around" per pt ?

## 2021-12-15 NOTE — Discharge Instructions (Addendum)
You have been seen here for back pain. I recommend taking over-the-counter pain medications like ibuprofen and/or Tylenol every 6 as needed.  Please follow dosage and on the back of bottle.  I also recommend applying heat to the area and stretching out the muscles as this will help decrease stiffness and pain.  I have given you information on exercises please follow. ? ?Given you a muscle relaxer please take as prescribed. ? ?Imaging show you have a nodule on your thyroid I would like you  to follow-up with your primary care provider for reevaluation. ? ?Come back to the emergency department if you develop chest pain, shortness of breath, severe abdominal pain, uncontrolled nausea, vomiting, diarrhea. ? ?

## 2021-12-19 ENCOUNTER — Other Ambulatory Visit (HOSPITAL_COMMUNITY): Payer: Self-pay | Admitting: Internal Medicine

## 2021-12-19 DIAGNOSIS — E041 Nontoxic single thyroid nodule: Secondary | ICD-10-CM | POA: Diagnosis not present

## 2021-12-26 ENCOUNTER — Other Ambulatory Visit (HOSPITAL_COMMUNITY): Payer: Self-pay | Admitting: Internal Medicine

## 2021-12-26 ENCOUNTER — Other Ambulatory Visit: Payer: Self-pay | Admitting: Internal Medicine

## 2021-12-26 ENCOUNTER — Ambulatory Visit (HOSPITAL_COMMUNITY)
Admission: RE | Admit: 2021-12-26 | Discharge: 2021-12-26 | Disposition: A | Discharge status: Home / Self Care | Payer: Medicare Other | Source: Ambulatory Visit | Attending: Internal Medicine | Admitting: Internal Medicine

## 2021-12-26 DIAGNOSIS — E041 Nontoxic single thyroid nodule: Secondary | ICD-10-CM | POA: Insufficient documentation

## 2021-12-31 DIAGNOSIS — I10 Essential (primary) hypertension: Secondary | ICD-10-CM | POA: Diagnosis not present

## 2021-12-31 DIAGNOSIS — Z79899 Other long term (current) drug therapy: Secondary | ICD-10-CM | POA: Diagnosis not present

## 2021-12-31 DIAGNOSIS — G629 Polyneuropathy, unspecified: Secondary | ICD-10-CM | POA: Diagnosis not present

## 2021-12-31 DIAGNOSIS — K219 Gastro-esophageal reflux disease without esophagitis: Secondary | ICD-10-CM | POA: Diagnosis not present

## 2021-12-31 DIAGNOSIS — R001 Bradycardia, unspecified: Secondary | ICD-10-CM | POA: Diagnosis not present

## 2021-12-31 DIAGNOSIS — N181 Chronic kidney disease, stage 1: Secondary | ICD-10-CM | POA: Diagnosis not present

## 2022-01-01 ENCOUNTER — Encounter (HOSPITAL_COMMUNITY): Payer: Self-pay

## 2022-01-01 ENCOUNTER — Emergency Department (HOSPITAL_COMMUNITY): Payer: Medicare Other

## 2022-01-01 ENCOUNTER — Emergency Department (HOSPITAL_COMMUNITY)
Admission: RE | Admit: 2022-01-01 | Discharge: 2022-01-01 | Disposition: A | Discharge status: Home / Self Care | Payer: Medicare Other | Source: Ambulatory Visit | Attending: Internal Medicine | Admitting: Internal Medicine

## 2022-01-01 ENCOUNTER — Emergency Department (HOSPITAL_COMMUNITY)
Admission: EM | Admit: 2022-01-01 | Discharge: 2022-01-01 | Disposition: A | Discharge status: Home / Self Care | Payer: Medicare Other | Attending: Emergency Medicine | Admitting: Emergency Medicine

## 2022-01-01 ENCOUNTER — Other Ambulatory Visit: Payer: Self-pay

## 2022-01-01 DIAGNOSIS — S8992XA Unspecified injury of left lower leg, initial encounter: Secondary | ICD-10-CM | POA: Diagnosis present

## 2022-01-01 DIAGNOSIS — S8001XA Contusion of right knee, initial encounter: Secondary | ICD-10-CM | POA: Insufficient documentation

## 2022-01-01 DIAGNOSIS — M25561 Pain in right knee: Secondary | ICD-10-CM | POA: Diagnosis not present

## 2022-01-01 DIAGNOSIS — I10 Essential (primary) hypertension: Secondary | ICD-10-CM | POA: Insufficient documentation

## 2022-01-01 DIAGNOSIS — R519 Headache, unspecified: Secondary | ICD-10-CM | POA: Diagnosis not present

## 2022-01-01 DIAGNOSIS — W1839XA Other fall on same level, initial encounter: Secondary | ICD-10-CM | POA: Diagnosis not present

## 2022-01-01 DIAGNOSIS — M542 Cervicalgia: Secondary | ICD-10-CM | POA: Diagnosis not present

## 2022-01-01 DIAGNOSIS — E041 Nontoxic single thyroid nodule: Secondary | ICD-10-CM | POA: Insufficient documentation

## 2022-01-01 DIAGNOSIS — S82045A Nondisplaced comminuted fracture of left patella, initial encounter for closed fracture: Secondary | ICD-10-CM | POA: Diagnosis not present

## 2022-01-01 DIAGNOSIS — S0033XA Contusion of nose, initial encounter: Secondary | ICD-10-CM | POA: Diagnosis not present

## 2022-01-01 DIAGNOSIS — M25562 Pain in left knee: Secondary | ICD-10-CM | POA: Diagnosis not present

## 2022-01-01 DIAGNOSIS — Z87891 Personal history of nicotine dependence: Secondary | ICD-10-CM | POA: Diagnosis not present

## 2022-01-01 DIAGNOSIS — S82042A Displaced comminuted fracture of left patella, initial encounter for closed fracture: Secondary | ICD-10-CM | POA: Diagnosis not present

## 2022-01-01 NOTE — ED Triage Notes (Signed)
Pt had a witnessed fall in ED hall way leaving with another pt. Pt complaining of bilat knee pain and nose. Nose and both knees are bruised. Denies taking blood thinners. No LOC ?

## 2022-01-01 NOTE — Discharge Instructions (Addendum)
You were evaluated in the Emergency Department and after careful evaluation, we did not find any emergent condition requiring admission or further testing in the hospital. ? ?Your exam/testing today was overall reassuring.  Your x-ray showed a broken kneecap bone.  Please use the knee immobilizer and follow-up with the orthopedic specialist.  Use Tylenol 1000 mg every 4-6 hours for pain.  You can take off the immobilizer for bathing purposes and small breaks. ? ?Please return to the Emergency Department if you experience any worsening of your condition.  Thank you for allowing [place] to be a part of your care. ? ?

## 2022-01-01 NOTE — Progress Notes (Signed)
Pt brought to [place] suite, gowned up. Draped and prepped in sterile manner. Lidocaine admin without complication at anterior neck. Samples obtained under US guidance. Tolerated well. Bandaged with gauze, no evidence of bleeding/hematoma.  ?

## 2022-01-01 NOTE — ED Provider Notes (Signed)
?[facility] DEPT ?[facility] Emergency Department ?Provider Note ?MRN:  [id number]  ?Arrival date & time: 01/01/22    ? ?Chief Complaint   ?Fall ?  ?History of Present Illness   ?Tricia Jenkins is a 81 y.o. year-old female with a history of hypertension presenting to the ED with chief complaint of fall. ? ?Patient was in the emergency department supporting her family member who had an injury.  She stumbled and fell in the hallway and landed hard on her face.  She is endorsing pain to the nose, headache, bilateral knee pain.  Denies chest pain or shortness of breath, no abdominal pain. ? ?Review of Systems  ?A thorough review of systems was obtained and all systems are negative except as noted in the HPI and PMH.  ? ?Patient's Health History   ? ?Past Medical History:  ?Diagnosis Date  ? Arthritis   ? Erosive esophagitis   ? GERD (gastroesophageal reflux disease)   ? HTN (hypertension)   ? Internal hemorrhoids 12/02/2002  ?  ?Past Surgical History:  ?Procedure Laterality Date  ? APPENDECTOMY    ? BREAST LUMPECTOMY WITH RADIOFREQUENCY TAG IDENTIFICATION Left 1/93/7902  ? Procedure: BREAST LUMPECTOMY WITH RADIOFREQUENCY TAG IDENTIFICATION;  Surgeon: Rusty Aus, DO;  Location: [facility];  Service: General;  Laterality: Left;  ? BUNIONECTOMY    ? bilateral x5  ? COLONOSCOPY  10/07/2012  ? RMR: Colorectal polyps-treated and/or removed as described above. Colonic diverticulosis.(serrated adenomas) next TCS 5 years  ? COLONOSCOPY W/ BIOPSIES  12/08/2002  ? RMR: Internal hemorrhoids, otherwise normal rectum Polyp at endocecum cold biopsied/removed Abnormal appearing ileocecal valve (of doubtful clinical significance)  biopsy showed chronic active colitis with significant lymphocytic infiltrate  ? ESOPHAGOGASTRODUODENOSCOPY  04/28/2007  ? RMR: Distal esophageal erosions consistent with mild to moderate  reflux esophagitis, otherwise, normal esophagus, small hiatal hernia; otherwise, normal stomach, first and  second duodenum  ? EXCISION OF BREAST BIOPSY Left 11/01/2021  ? Procedure: EXCISION OF BREAST BIOPSY;  Surgeon: Rusty Aus, DO;  Location: [facility];  Service: General;  Laterality: Left;  ? HEMORRHOID SURGERY    ? KIDNEY STONE SURGERY    ? SHOULDER SURGERY    ? left  ?  ?Family History  ?Problem Relation Age of Onset  ? CAD Mother   ?     deceased age 61  ? Pneumonia Father   ?     deceased age 67/also with h/o CVA  ? Colon cancer Neg Hx   ?  ?Social History  ? ?Socioeconomic History  ? Marital status: Married  ?  Spouse name: Not on file  ? Number of children: 1  ? Years of education: Not on file  ? Highest education level: Not on file  ?Occupational History  ? Occupation: Retired [employer]  ?Tobacco Use  ? Smoking status: Former  ?  Packs/day: 1.00  ?  Years: 15.00  ?  Pack years: 15.00  ?  Types: Cigarettes  ? Smokeless tobacco: Never  ?Substance and Sexual Activity  ? Alcohol use: No  ? Drug use: No  ? Sexual activity: Not on file  ?Other Topics Concern  ? Not on file  ?Social History Narrative  ? Not on file  ? ?Social Determinants of Health  ? ?Financial Resource Strain: Not on file  ?Food Insecurity: Not on file  ?Transportation Needs: Not on file  ?Physical Activity: Not on file  ?Stress: Not on file  ?Social Connections: Not on file  ?  Intimate Partner Violence: Not on file  ?  ? ?Physical Exam  ? ?Vitals:  ? 01/01/22 0440  ?BP: (!) 151/88  ?Pulse: 90  ?Resp: 19  ?SpO2: 100%  ?  ?CONSTITUTIONAL: Well-appearing, NAD ?NEURO/PSYCH:  Alert and oriented x 3, normal and symmetric strength and sensation, normal coordination, normal speech ?EYES:  eyes equal and reactive ?ENT/NECK:  no LAD, no JVD ?CARDIO: Regular rate, well-perfused, normal S1 and S2 ?PULM:  CTAB no wheezing or rhonchi ?GI/GU:  non-distended, non-tender ?MSK/SPINE:  No gross deformities, no edema ?SKIN: Bruising to the nose, bilateral knees ? ? ?*Additional and/or pertinent findings included in MDM below ? ?Diagnostic and  Interventional Summary  ? ? EKG Interpretation ? ?Date/Time:    ?Ventricular Rate:    ?PR Interval:    ?QRS Duration:   ?QT Interval:    ?QTC Calculation:   ?R Axis:     ?Text Interpretation:   ?  ? ?  ? ?Labs Reviewed - No data to display  ?DG Knee Complete 4 Views Left  ?Final Result  ?  ?DG Knee Complete 4 Views Right  ?Final Result  ?  ?CT HEAD WO CONTRAST (5MM)  ?Final Result  ?  ?CT CERVICAL SPINE WO CONTRAST  ?Final Result  ?  ?CT MAXILLOFACIAL WO CONTRAST  ?Final Result  ?  ?  ?Medications - No data to display  ? ?Procedures  /  Critical Care ?Procedures ? ?ED Course and Medical Decision Making  ?Initial Impression and Ddx ?CT imaging to exclude facial fracture, intracranial bleeding.  X-rays of the knees as well.  Nothing to suggest significant intra-abdominal or intrathoracic injury. ? ?Past medical/surgical history that increases complexity of ED encounter: None ? ?Interpretation of Diagnostics ?I personally reviewed the knee x-ray and my interpretation is as follows: Right knee without fracture, left knee with patella fracture ?   ?CT imaging is overall reassuring, no traumatic injuries. ? ?Patient Reassessment and Ultimate Disposition/Management ?Patient looks and feels well, will place in knee immobilizer and refer to orthopedics. ? ?Patient management required discussion with the following services or consulting groups:  None ? ?Complexity of Problems Addressed ?Acute illness or injury that poses threat of life of bodily function ? ?Additional Data Reviewed and Analyzed ?Further history obtained from: ?Further history from spouse/family member ? ?Additional Factors Impacting ED Encounter Risk ?Minor Procedures ? ?Barth Kirks. Sedonia Small, MD ?[facility] Emergency Medicine ?[email or web address] ? ?Final Clinical Impressions(s) / ED Diagnoses  ? ?  ICD-10-CM   ?1. Closed nondisplaced comminuted fracture of left patella, initial encounter  S82.045A   ?  ?  ?ED Discharge Orders   ? ?  None  ? ?  ?  ? ?Discharge Instructions Discussed with and Provided to Patient:  ? ? ? ?Discharge Instructions   ? ?  ?You were evaluated in the Emergency Department and after careful evaluation, we did not find any emergent condition requiring admission or further testing in the hospital. ? ?Your exam/testing today was overall reassuring.  Your x-ray showed a broken kneecap bone.  Please use the knee immobilizer and follow-up with the orthopedic specialist.  Use Tylenol 1000 mg every 4-6 hours for pain.  You can take off the immobilizer for bathing purposes and small breaks. ? ?Please return to the Emergency Department if you experience any worsening of your condition.  Thank you for allowing [place] to be a part of your care. ? ? ? ? ? ?  ?Maudie Flakes, MD ?  01/01/22 0558 ? ?

## 2022-01-02 ENCOUNTER — Encounter: Payer: Self-pay | Admitting: Orthopedic Surgery

## 2022-01-02 ENCOUNTER — Ambulatory Visit (INDEPENDENT_AMBULATORY_CARE_PROVIDER_SITE_OTHER): Payer: Medicare Other | Admitting: Orthopedic Surgery

## 2022-01-02 VITALS — BP 130/92 | HR 99

## 2022-01-02 DIAGNOSIS — S82035A Nondisplaced transverse fracture of left patella, initial encounter for closed fracture: Secondary | ICD-10-CM

## 2022-01-02 LAB — CYTOLOGY - NON PAP

## 2022-01-02 MED ORDER — HYDROCODONE-ACETAMINOPHEN 5-325 MG PO TABS: 1.0000 | ORAL_TABLET | Freq: Four times a day (QID) | ORAL | 0 refills | Status: AC | PRN

## 2022-01-02 MED ORDER — HYDROCODONE-ACETAMINOPHEN 5-325 MG PO TABS: 1.0000 | ORAL_TABLET | Freq: Four times a day (QID) | ORAL | 0 refills | Status: DC | PRN

## 2022-01-02 NOTE — Addendum Note (Signed)
Addended by: Carole Civil on: 01/02/2022 03:12 PM ? ? Modules accepted: Orders ? ?

## 2022-01-02 NOTE — Progress Notes (Signed)
Emergency room follow-up I reviewed the x-rays of the right and left knee ? ?I agree after reviewing the x-ray that there is a transverse patella fracture on the left no fracture on the right ? ? ?Chief Complaint  ?Patient presents with  ? Knee Injury  ?  LEFT patella fracture ?DOI 01/01/22  ? ? ?HPI: 81 year old female mechanical fall injured left knee patient says she cannot walk needs help ? ?She was sent home from the ER placed in the car and told to drive home ? ?Past Medical History:  ?Diagnosis Date  ? Arthritis   ? Erosive esophagitis   ? GERD (gastroesophageal reflux disease)   ? HTN (hypertension)   ? Internal hemorrhoids 12/02/2002  ? ? ?BP (!) 130/92   Pulse 99  ? ? ?General appearance: Well-developed well-nourished no gross deformities ? ?Cardiovascular normal pulse and perfusion normal color without edema ? ?Neurologically no sensation loss or deficits or pathologic reflexes ? ?Psychological: Awake alert and oriented x3 mood and affect normal ? ?Skin no lacerations or ulcerations no nodularity no palpable masses, no erythema or nodularity ? ?Musculoskeletal: Tenderness over the left patella slight extensor lag mild swelling no bruising ? ?Imaging independent image interpretation left knee transverse patella fracture nondisplaced, right knee no fracture ? ?A/P ? ?Knee immobilizer for 6 weeks ? ?X-ray in 3 weeks ? ?No range of motion until 6 weeks ? ?Meds ordered this encounter  ?Medications  ? HYDROcodone-acetaminophen (NORCO/VICODIN) 5-325 MG tablet  ?  Sig: Take 1 tablet by mouth every 6 (six) hours as needed for up to 5 days for moderate pain.  ?  Dispense:  20 tablet  ?  Refill:  0  ? ? ?The patient is unable to ambulate she does not have a cane she does not have a wheelchair ? ?She cannot stand by herself, she cannot cook or bathe her self secondary to the fracture of the left knee ? ?Were going to order both and we have asked her to call her insurance company to find out who can provide home  health and then let us know and we can put orders in ?

## 2022-01-02 NOTE — Patient Instructions (Signed)
Walker and wheelchair order will be faxed to [place] by the end of the day.  ?

## 2022-01-07 DIAGNOSIS — S82001A Unspecified fracture of right patella, initial encounter for closed fracture: Secondary | ICD-10-CM | POA: Diagnosis not present

## 2022-01-07 DIAGNOSIS — N1831 Chronic kidney disease, stage 3a: Secondary | ICD-10-CM | POA: Diagnosis not present

## 2022-01-19 DIAGNOSIS — K219 Gastro-esophageal reflux disease without esophagitis: Secondary | ICD-10-CM | POA: Diagnosis not present

## 2022-01-19 DIAGNOSIS — I10 Essential (primary) hypertension: Secondary | ICD-10-CM | POA: Diagnosis not present

## 2022-01-23 ENCOUNTER — Ambulatory Visit (INDEPENDENT_AMBULATORY_CARE_PROVIDER_SITE_OTHER): Payer: Medicare Other | Admitting: Orthopedic Surgery

## 2022-01-23 ENCOUNTER — Ambulatory Visit (INDEPENDENT_AMBULATORY_CARE_PROVIDER_SITE_OTHER): Payer: Medicare Other

## 2022-01-23 ENCOUNTER — Encounter: Payer: Self-pay | Admitting: Orthopedic Surgery

## 2022-01-23 VITALS — Ht 65.5 in | Wt 185.0 lb

## 2022-01-23 DIAGNOSIS — S82035D Nondisplaced transverse fracture of left patella, subsequent encounter for closed fracture with routine healing: Secondary | ICD-10-CM

## 2022-01-23 DIAGNOSIS — N3000 Acute cystitis without hematuria: Secondary | ICD-10-CM | POA: Diagnosis not present

## 2022-01-23 NOTE — Progress Notes (Signed)
FOLLOW UP  ? ?Encounter Diagnosis  ?Name Primary?  ? Closed nondisplaced transverse fracture of left patella with routine healing, subsequent encounter Yes  ? ? ? ?Chief Complaint  ?Patient presents with  ? Fracture  ?  Lt patella fx DOI 01/01/22  ? ? ? ?3-week fracture care follow-up left patella patient can fully extend her knee says she is doing well I agree x-ray shows fracture stable recommend economy hinged brace x-ray in 4 weeks ?

## 2022-01-23 NOTE — Patient Instructions (Signed)
Follow-up in 4 weeks

## 2022-02-18 ENCOUNTER — Emergency Department (HOSPITAL_COMMUNITY): Payer: Medicare Other

## 2022-02-18 ENCOUNTER — Encounter (HOSPITAL_COMMUNITY): Payer: Self-pay

## 2022-02-18 ENCOUNTER — Other Ambulatory Visit: Payer: Self-pay

## 2022-02-18 ENCOUNTER — Emergency Department (HOSPITAL_COMMUNITY)
Admission: EM | Admit: 2022-02-18 | Discharge: 2022-02-18 | Disposition: A | Discharge status: Home / Self Care | Payer: Medicare Other | Attending: Emergency Medicine | Admitting: Emergency Medicine

## 2022-02-18 DIAGNOSIS — E876 Hypokalemia: Secondary | ICD-10-CM | POA: Diagnosis not present

## 2022-02-18 DIAGNOSIS — Z79899 Other long term (current) drug therapy: Secondary | ICD-10-CM | POA: Diagnosis not present

## 2022-02-18 DIAGNOSIS — K5900 Constipation, unspecified: Secondary | ICD-10-CM | POA: Insufficient documentation

## 2022-02-18 DIAGNOSIS — M19012 Primary osteoarthritis, left shoulder: Secondary | ICD-10-CM | POA: Diagnosis not present

## 2022-02-18 DIAGNOSIS — R110 Nausea: Secondary | ICD-10-CM | POA: Diagnosis not present

## 2022-02-18 DIAGNOSIS — M791 Myalgia, unspecified site: Secondary | ICD-10-CM | POA: Insufficient documentation

## 2022-02-18 DIAGNOSIS — M25512 Pain in left shoulder: Secondary | ICD-10-CM | POA: Diagnosis not present

## 2022-02-18 DIAGNOSIS — E860 Dehydration: Secondary | ICD-10-CM | POA: Diagnosis not present

## 2022-02-18 DIAGNOSIS — I10 Essential (primary) hypertension: Secondary | ICD-10-CM | POA: Diagnosis not present

## 2022-02-18 DIAGNOSIS — R630 Anorexia: Secondary | ICD-10-CM | POA: Insufficient documentation

## 2022-02-18 DIAGNOSIS — R109 Unspecified abdominal pain: Secondary | ICD-10-CM | POA: Diagnosis not present

## 2022-02-18 LAB — COMPREHENSIVE METABOLIC PANEL
ALT: 12 U/L (ref 0–44)
AST: 17 U/L (ref 15–41)
Albumin: 3.1 g/dL — ABNORMAL LOW (ref 3.5–5.0)
Alkaline Phosphatase: 86 U/L (ref 38–126)
Anion gap: 13 (ref 5–15)
BUN: 18 mg/dL (ref 8–23)
CO2: 26 mmol/L (ref 22–32)
Calcium: 6.8 mg/dL — ABNORMAL LOW (ref 8.9–10.3)
Chloride: 100 mmol/L (ref 98–111)
Creatinine, Ser: 1.4 mg/dL — ABNORMAL HIGH (ref 0.44–1.00)
GFR, Estimated: 38 mL/min — ABNORMAL LOW (ref >60)
Glucose, Bld: 107 mg/dL — ABNORMAL HIGH (ref 70–99)
Potassium: 3.2 mmol/L — ABNORMAL LOW (ref 3.5–5.1)
Sodium: 139 mmol/L (ref 135–145)
Total Bilirubin: 0.7 mg/dL (ref 0.3–1.2)
Total Protein: 7 g/dL (ref 6.5–8.1)

## 2022-02-18 LAB — URINALYSIS, ROUTINE W REFLEX MICROSCOPIC
Bilirubin Urine: NEGATIVE
Glucose, UA: NEGATIVE mg/dL
Hgb urine dipstick: NEGATIVE
Ketones, ur: NEGATIVE mg/dL
Leukocytes,Ua: NEGATIVE
Nitrite: NEGATIVE
Protein, ur: NEGATIVE mg/dL
Specific Gravity, Urine: 1.01 (ref 1.005–1.030)
pH: 6 (ref 5.0–8.0)

## 2022-02-18 LAB — CBC WITH DIFFERENTIAL/PLATELET
Abs Immature Granulocytes: 0.1 10*3/uL — ABNORMAL HIGH (ref 0.00–0.07)
Basophils Absolute: 0 10*3/uL (ref 0.0–0.1)
Basophils Relative: 0 %
Eosinophils Absolute: 0 10*3/uL (ref 0.0–0.5)
Eosinophils Relative: 0 %
HCT: 29.9 % — ABNORMAL LOW (ref 36.0–46.0)
Hemoglobin: 9.9 g/dL — ABNORMAL LOW (ref 12.0–15.0)
Immature Granulocytes: 1 %
Lymphocytes Relative: 9 %
Lymphs Abs: 1.1 10*3/uL (ref 0.7–4.0)
MCH: 28.8 pg (ref 26.0–34.0)
MCHC: 33.1 g/dL (ref 30.0–36.0)
MCV: 86.9 fL (ref 80.0–100.0)
Monocytes Absolute: 1.2 10*3/uL — ABNORMAL HIGH (ref 0.1–1.0)
Monocytes Relative: 10 %
Neutro Abs: 9.5 10*3/uL — ABNORMAL HIGH (ref 1.7–7.7)
Neutrophils Relative %: 80 %
Platelets: 329 10*3/uL (ref 150–400)
RBC: 3.44 MIL/uL — ABNORMAL LOW (ref 3.87–5.11)
RDW: 15.1 % (ref 11.5–15.5)
WBC: 11.9 10*3/uL — ABNORMAL HIGH (ref 4.0–10.5)
nRBC: 0 % (ref 0.0–0.2)

## 2022-02-18 LAB — MAGNESIUM: Magnesium: 0.8 mg/dL — CL (ref 1.7–2.4)

## 2022-02-18 MED ORDER — SODIUM CHLORIDE 0.9 % IV SOLN: INTRAVENOUS | Status: DC

## 2022-02-18 MED ORDER — POTASSIUM CHLORIDE 10 MEQ/100ML IV SOLN
10.0000 meq | Freq: Once | INTRAVENOUS | Status: AC
  Administered 2022-02-18: 10 meq via INTRAVENOUS
  Filled 2022-02-18: qty 100

## 2022-02-18 MED ORDER — ONDANSETRON HCL 4 MG/2ML IJ SOLN
4.0000 mg | Freq: Once | INTRAMUSCULAR | Status: AC
  Administered 2022-02-18: 4 mg via INTRAVENOUS
  Filled 2022-02-18: qty 2

## 2022-02-18 MED ORDER — ACETAMINOPHEN 500 MG PO TABS
1000.0000 mg | ORAL_TABLET | Freq: Once | ORAL | Status: AC
  Administered 2022-02-18: 1000 mg via ORAL
  Filled 2022-02-18: qty 2

## 2022-02-18 MED ORDER — MAGNESIUM SULFATE 2 GM/50ML IV SOLN
2.0000 g | Freq: Once | INTRAVENOUS | Status: AC
Start: 2022-02-18 — End: 2022-02-18
  Administered 2022-02-18: 2 g via INTRAVENOUS
  Filled 2022-02-18: qty 50

## 2022-02-18 MED ORDER — POTASSIUM CHLORIDE ER 10 MEQ PO TBCR: 10.0000 meq | EXTENDED_RELEASE_TABLET | Freq: Every day | ORAL | 0 refills | Status: DC

## 2022-02-18 MED ORDER — MAGNESIUM OXIDE 400 MG PO CAPS: 400.0000 mg | ORAL_CAPSULE | Freq: Two times a day (BID) | ORAL | 0 refills | Status: AC

## 2022-02-18 NOTE — ED Notes (Signed)
Ambulated patient to the bathroom with walker.  Patient given meal

## 2022-02-18 NOTE — Discharge Instructions (Addendum)
Please have your family doctor recheck you within 1 week to make sure that your magnesium levels are coming back up.  Until then I would like for you to take the following medications  Go to the pharmacy and pick up 1 bottle of magnesium citrate, drink this tonight, this will help you have a bowel movement  At the pharmacy, he did pick up the magnesium oxide and take this twice a day for 7 days, the potassium supplement should be once a day for 7 days  At the end of that time your family doctor can recheck your levels of magnesium and potassium  Emergency department for severe or worsening symptoms

## 2022-02-18 NOTE — ED Provider Notes (Signed)
[facility] Provider Note   CSN: [id number] Arrival date & time: 02/18/22  5638     History  Chief Complaint  Patient presents with   Constipation    Tricia Jenkins is a 81 y.o. female.   Constipation  81 year old female on amlodipine for high blood pressure Prilosec for acid reflux and recently suffered a patellar fracture currently not taking opiate medications.  Presents after having 4 or 5 days of poor appetite, nausea and no bowel movement in about 6 days.  The patient states that she is an "daily pooper", and has not had a bowel movement in about 5 or 6 days.  She denies fevers or chills or myalgias, she has had increasing pain in her left shoulder because she has been using her arms to pull herself up and down because of her knee injury.  She usually walks with a walker and a knee brace but did not use that today.  She called her neighbor for help getting out of bed to come to the ER for an evaluation.  Home Medications Prior to Admission medications   Medication Sig Start Date End Date Taking? Authorizing Provider  amLODipine (NORVASC) 5 MG tablet Take 5 mg by mouth daily.     Yes [provider]  gabapentin (NEURONTIN) 300 MG capsule Take 1 capsule (300 mg total) by mouth at bedtime. 12/08/21  Yes Johnson, Clanford L, MD  hydrochlorothiazide (HYDRODIURIL) 25 MG tablet Take 25 mg by mouth every morning. 02/05/22  Yes [provider]  Magnesium Oxide 400 MG CAPS Take 1 capsule (400 mg total) by mouth 2 (two) times daily for 14 days. 02/18/22 03/04/22 Yes Noemi Chapel, MD  omeprazole (PRILOSEC) 20 MG capsule Take 1 capsule (20 mg total) by mouth daily. 12/08/21  Yes Johnson, Clanford L, MD  potassium chloride (KLOR-CON) 10 MEQ tablet Take 1 tablet (10 mEq total) by mouth daily for 7 days. 02/18/22 02/25/22 Yes Noemi Chapel, MD  cyclobenzaprine (FLEXERIL) 10 MG tablet Take 1 tablet (10 mg total) by mouth 2 (two) times daily as needed for muscle  spasms. Patient not taking: Reported on 02/18/2022 12/15/21   Marcello Fennel, PA-C      Allergies    Naproxen and Piroxicam    Review of Systems   Review of Systems  Gastrointestinal:  Positive for constipation.  All other systems reviewed and are negative.  Physical Exam Updated Vital Signs BP (!) 128/57   Pulse 77   Temp 98.3 F (36.8 C) (Oral)   Resp 16   Ht 1.664 m (5' 5.5")   Wt 81.6 kg   SpO2 94%   BMI 29.50 kg/m  Physical Exam Vitals and nursing note reviewed.  Constitutional:      General: She is not in acute distress.    Appearance: She is well-developed.  HENT:     Head: Normocephalic and atraumatic.     Mouth/Throat:     Mouth: Mucous membranes are dry.     Pharynx: No oropharyngeal exudate.  Eyes:     General: No scleral icterus.       Right eye: No discharge.        Left eye: No discharge.     Conjunctiva/sclera: Conjunctivae normal.     Pupils: Pupils are equal, round, and reactive to light.  Neck:     Thyroid: No thyromegaly.     Vascular: No JVD.  Cardiovascular:     Rate and Rhythm: Normal rate and regular rhythm.  Heart sounds: Normal heart sounds. No murmur heard.   No friction rub. No gallop.  Pulmonary:     Effort: Pulmonary effort is normal. No respiratory distress.     Breath sounds: Normal breath sounds. No wheezing or rales.  Abdominal:     General: Bowel sounds are normal. There is no distension.     Palpations: Abdomen is soft. There is no mass.     Tenderness: There is no abdominal tenderness.  Musculoskeletal:        General: Tenderness present. Normal range of motion.     Cervical back: Normal range of motion and neck supple.     Comments: Mild tenderness over the bilateral knees, no obvious swelling or effusions, left shoulder with tenderness in the posterior aspect around the rhomboids and trapezius, supple joints otherwise, soft compartment  Lymphadenopathy:     Cervical: No cervical adenopathy.  Skin:    General:  Skin is warm and dry.     Findings: No erythema or rash.  Neurological:     Mental Status: She is alert.     Coordination: Coordination normal.  Psychiatric:        Behavior: Behavior normal.    ED Results / Procedures / Treatments   Labs (all labs ordered are listed, but only abnormal results are displayed) Labs Reviewed  COMPREHENSIVE METABOLIC PANEL - Abnormal; Notable for the following components:      Result Value   Potassium 3.2 (*)    Glucose, Bld 107 (*)    Creatinine, Ser 1.40 (*)    Calcium 6.8 (*)    Albumin 3.1 (*)    GFR, Estimated 38 (*)    All other components within normal limits  CBC WITH DIFFERENTIAL/PLATELET - Abnormal; Notable for the following components:   WBC 11.9 (*)    RBC 3.44 (*)    Hemoglobin 9.9 (*)    HCT 29.9 (*)    Neutro Abs 9.5 (*)    Monocytes Absolute 1.2 (*)    Abs Immature Granulocytes 0.10 (*)    All other components within normal limits  MAGNESIUM - Abnormal; Notable for the following components:   Magnesium 0.8 (*)    All other components within normal limits  URINE CULTURE  URINALYSIS, ROUTINE W REFLEX MICROSCOPIC    EKG EKG Interpretation  Date/Time:  Tuesday Feb 18 2022 07:44:22 EDT Ventricular Rate:  85 PR Interval:  206 QRS Duration: 74 QT Interval:  368 QTC Calculation: 435 R Axis:   -26 Text Interpretation: Sinus rhythm Borderline left axis deviation Low voltage, precordial leads Abnormal R-wave progression, early transition Confirmed by Noemi Chapel [id number]) on 02/18/2022 9:22:47 AM  Radiology DG Shoulder Left  Result Date: 02/18/2022 CLINICAL DATA:  Left shoulder pain after injury 6 weeks ago. EXAM: LEFT SHOULDER - 2+ VIEW COMPARISON:  None Available. FINDINGS: There is no evidence of fracture or dislocation. Moderate degenerative changes seen involving the left acromioclavicular joint. Soft tissues are unremarkable. IMPRESSION: Moderate degenerative joint disease of the left acromioclavicular joint. No acute  abnormality seen. Electronically Signed   By: Marijo Conception M.D.   On: 02/18/2022 08:31   DG ABD ACUTE 2+V W 1V CHEST  Result Date: 02/18/2022 CLINICAL DATA:  Constipation.  Abdominal pain. EXAM: DG ABDOMEN ACUTE WITH 1 VIEW CHEST COMPARISON:  August 28, 2020. FINDINGS: There is no evidence of dilated bowel loops or free intraperitoneal air. Mild amount of stool seen throughout the colon. No radiopaque calculi or other significant radiographic abnormality is seen.  Heart size and mediastinal contours are within normal limits. Both lungs are clear. IMPRESSION: No abnormal bowel dilatation. Mild stool burden is noted. No acute cardiopulmonary disease. Electronically Signed   By: Marijo Conception M.D.   On: 02/18/2022 08:29    Procedures Procedures    Medications Ordered in ED Medications  0.9 %  sodium chloride infusion ( Intravenous New Bag/Given 02/18/22 1246)  ondansetron (ZOFRAN) injection 4 mg (4 mg Intravenous Given 02/18/22 0851)  magnesium sulfate IVPB 2 g 50 mL (0 g Intravenous Stopped 02/18/22 1116)  potassium chloride 10 mEq in 100 mL IVPB (0 mEq Intravenous Stopped 02/18/22 1116)  acetaminophen (TYLENOL) tablet 1,000 mg (1,000 mg Oral Given 02/18/22 1209)    ED Course/ Medical Decision Making/ A&P                           Medical Decision Making Amount and/or Complexity of Data Reviewed Labs: ordered. Radiology: ordered.  Risk OTC drugs. Prescription drug management.   This patient presents to the ED for concern of weakness dehydration nausea and constipation, this involves an extensive number of treatment options, and is a complaint that carries with it a high risk of complications and morbidity.  The differential diagnosis includes potentially medication related constipation given the opiate use recently, would also consider urinary tract infection as she has had this in the past and is having poor appetite and generalized weakness.  Vital signs thankfully unremarkable at  this time   Co morbidities that complicate the patient evaluation  Hypertension, recent opiate use, fracture of the patella, lives by herself   Additional history obtained:  Additional history obtained from family members friends at the bedside and electronic medical record External records from outside source obtained and reviewed including prior x-rays including patellar fracture x-rays as well as orthopedic office visits, being treated nonsurgically   Lab Tests:  I Ordered, and personally interpreted labs.  The pertinent results include: Hypomagnesemia with a level of 0.8, potassium was slightly low as well, creatinine was slightly elevated at 1.4, this is better than her previous creatinines, in March it was 1.8 and had been up as high as almost 3 earlier in that month   Imaging Studies ordered:  I ordered imaging studies including x-ray of the shoulder was unremarkable and the x-ray of the abdomen showed likely some stool burden but no other significant findings including obstruction or free air I independently visualized and interpreted imaging which showed as above I agree with the radiologist interpretation   Cardiac Monitoring: / EKG:  The patient was maintained on a cardiac monitor.  I personally viewed and interpreted the cardiac monitored which showed an underlying rhythm of: Normal sinus rhythm   Problem List / ED Course / Critical interventions / Medication management  The patient was given supplemental magnesium and potassium and significantly improved, she finally asked to eat and was able to tolerate a meal feeling much better and wishing to go home.  Given her normal vital signs I think this is reasonable I ordered medication including magnesium sulfate and potassium supplements as well as IV fluids for electrolyte deficiencies Reevaluation of the patient after these medicines showed that the patient improved I have reviewed the patients home medicines and have  made adjustments as needed   Social Determinants of Health:  None   Test / Admission - Considered:  Considered admission but the patient had a significant improvement in her symptoms, her kidney  function was much better, her electrolytes were repleted and she felt better at the time of discharge willing to take medicines at home   I have discussed with the patient at the bedside the results, and the meaning of these results.  They have expressed her understanding to the need for follow-up with primary care physician         Final Clinical Impression(s) / ED Diagnoses Final diagnoses:  Constipation, unspecified constipation type  Hypomagnesemia  Hypokalemia    Rx / DC Orders ED Discharge Orders          Ordered    Magnesium Oxide 400 MG CAPS  2 times daily        02/18/22 1431    potassium chloride (KLOR-CON) 10 MEQ tablet  Daily        02/18/22 1431              Noemi Chapel, MD 02/18/22 1434

## 2022-02-18 NOTE — ED Triage Notes (Signed)
Pt presents to ED with constipation x 1 week. Pt states she hasn't tried anything. Pt denies abdominal pain. Pt also c/o left shoulder pain states she thinks it is where she has been pulling herself up at the house where she broke her knee 6 weeks ago, pt states shoulder pain got worse Sunday.

## 2022-02-19 DIAGNOSIS — I10 Essential (primary) hypertension: Secondary | ICD-10-CM | POA: Diagnosis not present

## 2022-02-19 DIAGNOSIS — K219 Gastro-esophageal reflux disease without esophagitis: Secondary | ICD-10-CM | POA: Diagnosis not present

## 2022-02-19 DIAGNOSIS — S82036D Nondisplaced transverse fracture of unspecified patella, subsequent encounter for closed fracture with routine healing: Secondary | ICD-10-CM | POA: Insufficient documentation

## 2022-02-19 LAB — URINE CULTURE: Culture: NO GROWTH

## 2022-02-20 ENCOUNTER — Ambulatory Visit (INDEPENDENT_AMBULATORY_CARE_PROVIDER_SITE_OTHER): Payer: Medicare Other

## 2022-02-20 ENCOUNTER — Ambulatory Visit (INDEPENDENT_AMBULATORY_CARE_PROVIDER_SITE_OTHER): Payer: Medicare Other | Admitting: Orthopedic Surgery

## 2022-02-20 DIAGNOSIS — S82035D Nondisplaced transverse fracture of left patella, subsequent encounter for closed fracture with routine healing: Secondary | ICD-10-CM

## 2022-02-20 DIAGNOSIS — M25461 Effusion, right knee: Secondary | ICD-10-CM

## 2022-02-20 NOTE — Progress Notes (Signed)
FOLLOW UP   Encounter Diagnosis  Name Primary?   Closed nondisplaced transverse fracture of left patella with routine healing, subsequent encounter 01/01/22 Yes     Chief Complaint  Patient presents with   Knee Injury    LT// patella fracture DOI 01/01/22 Also states that now her right knee is starting to hurt     Follow-up for left patella fracture  Patient complaining of some right knee pain at her age we expect this to be nothing significant there is been no new injury.    RIGHT KNEE  EFFUSION  LOSS OF FULL ROM   A/I   Procedure note injection and aspiration right knee joint  Verbal consent was obtained to aspirate and inject the right knee joint   Timeout was completed to confirm the site of aspiration and injection  An 18-gauge needle was used to aspirate the knee joint from a suprapatellar lateral approach.  The medications used were 40 mg of Depo-Medrol and 1% lidocaine 3 cc  Anesthesia was provided by ethyl chloride and the skin was prepped with alcohol.  After cleaning the skin with alcohol an 18-gauge needle was used to aspirate the right knee joint.  We obtained 752  cc of fluid CLOUDY YELLOW NON PURULENT   We follow this by injection of 40 mg of Depo-Medrol and 3 cc 1% lidocaine.  There were no complications. A sterile bandage was applied.  She can take some Tylenol and Advil for that  As far as the left patella fracture goes she is here for x-ray she was having some difficulty fully extending her knee we put her in economy hinged brace   BRACE RIGHT KNEE ICE IBUPROFEN RIGHT KNEE  FU DR Christena Flake Dennis Bast W RESULTS   FLUID SENT

## 2022-02-20 NOTE — Addendum Note (Signed)
Addended byCandice Camp on: 02/20/2022 11:02 AM   Modules accepted: Orders

## 2022-02-20 NOTE — Patient Instructions (Addendum)
WEAR BRACE RIGHT KNEE USE WALKER  ICE RIGHT KNEE 3 X A DAY FOR 30 MIN   DR Graysyn Bache WILL CALL YOU WITH THE RESULTS

## 2022-02-21 LAB — SYNOVIAL FLUID ANALYSIS, COMPLETE
Basophils, %: 0 %
Eosinophils-Synovial: 0 % (ref 0–2)
Lymphocytes-Synovial Fld: 12 % (ref 0–74)
Monocyte/Macrophage: 6 % (ref 0–69)
Neutrophil, Synovial: 82 % — ABNORMAL HIGH (ref 0–24)
Synoviocytes, %: 0 % (ref 0–15)
WBC, Synovial: 2762 cells/uL — ABNORMAL HIGH (ref <150)

## 2022-02-28 ENCOUNTER — Telehealth: Payer: Self-pay | Admitting: Orthopedic Surgery

## 2022-02-28 NOTE — Telephone Encounter (Signed)
Result report to patient  Patient has pseudogout  Patient is now asymptomatic

## 2022-03-05 DIAGNOSIS — M11261 Other chondrocalcinosis, right knee: Secondary | ICD-10-CM | POA: Diagnosis not present

## 2022-03-05 DIAGNOSIS — K5909 Other constipation: Secondary | ICD-10-CM | POA: Diagnosis not present

## 2022-03-05 DIAGNOSIS — Z79899 Other long term (current) drug therapy: Secondary | ICD-10-CM | POA: Diagnosis not present

## 2022-03-05 DIAGNOSIS — E876 Hypokalemia: Secondary | ICD-10-CM | POA: Diagnosis not present

## 2022-03-21 DIAGNOSIS — K219 Gastro-esophageal reflux disease without esophagitis: Secondary | ICD-10-CM | POA: Diagnosis not present

## 2022-03-21 DIAGNOSIS — I10 Essential (primary) hypertension: Secondary | ICD-10-CM | POA: Diagnosis not present

## 2022-03-26 DIAGNOSIS — Z1283 Encounter for screening for malignant neoplasm of skin: Secondary | ICD-10-CM | POA: Diagnosis not present

## 2022-03-26 DIAGNOSIS — Z85828 Personal history of other malignant neoplasm of skin: Secondary | ICD-10-CM | POA: Diagnosis not present

## 2022-03-26 DIAGNOSIS — L570 Actinic keratosis: Secondary | ICD-10-CM | POA: Diagnosis not present

## 2022-04-01 DIAGNOSIS — Z79899 Other long term (current) drug therapy: Secondary | ICD-10-CM | POA: Diagnosis not present

## 2022-04-01 DIAGNOSIS — K219 Gastro-esophageal reflux disease without esophagitis: Secondary | ICD-10-CM | POA: Diagnosis not present

## 2022-04-01 DIAGNOSIS — I10 Essential (primary) hypertension: Secondary | ICD-10-CM | POA: Diagnosis not present

## 2022-04-01 DIAGNOSIS — N1831 Chronic kidney disease, stage 3a: Secondary | ICD-10-CM | POA: Diagnosis not present

## 2022-04-01 DIAGNOSIS — G510 Bell's palsy: Secondary | ICD-10-CM | POA: Diagnosis not present

## 2022-04-08 DIAGNOSIS — I10 Essential (primary) hypertension: Secondary | ICD-10-CM | POA: Diagnosis not present

## 2022-04-08 DIAGNOSIS — N1832 Chronic kidney disease, stage 3b: Secondary | ICD-10-CM | POA: Diagnosis not present

## 2022-04-08 DIAGNOSIS — K219 Gastro-esophageal reflux disease without esophagitis: Secondary | ICD-10-CM | POA: Diagnosis not present

## 2022-04-08 DIAGNOSIS — G629 Polyneuropathy, unspecified: Secondary | ICD-10-CM | POA: Diagnosis not present

## 2022-04-08 DIAGNOSIS — Z6825 Body mass index (BMI) 25.0-25.9, adult: Secondary | ICD-10-CM | POA: Diagnosis not present

## 2022-04-21 DIAGNOSIS — K219 Gastro-esophageal reflux disease without esophagitis: Secondary | ICD-10-CM | POA: Diagnosis not present

## 2022-04-21 DIAGNOSIS — I10 Essential (primary) hypertension: Secondary | ICD-10-CM | POA: Diagnosis not present

## 2022-05-13 DIAGNOSIS — B880 Other acariasis: Secondary | ICD-10-CM | POA: Diagnosis not present

## 2022-05-22 DIAGNOSIS — I10 Essential (primary) hypertension: Secondary | ICD-10-CM | POA: Diagnosis not present

## 2022-05-22 DIAGNOSIS — K219 Gastro-esophageal reflux disease without esophagitis: Secondary | ICD-10-CM | POA: Diagnosis not present

## 2022-05-30 DIAGNOSIS — K219 Gastro-esophageal reflux disease without esophagitis: Secondary | ICD-10-CM | POA: Diagnosis not present

## 2022-05-30 DIAGNOSIS — N1832 Chronic kidney disease, stage 3b: Secondary | ICD-10-CM | POA: Diagnosis not present

## 2022-05-30 DIAGNOSIS — Z79899 Other long term (current) drug therapy: Secondary | ICD-10-CM | POA: Diagnosis not present

## 2022-05-30 DIAGNOSIS — I10 Essential (primary) hypertension: Secondary | ICD-10-CM | POA: Diagnosis not present

## 2022-06-06 DIAGNOSIS — N1831 Chronic kidney disease, stage 3a: Secondary | ICD-10-CM | POA: Diagnosis not present

## 2022-06-06 DIAGNOSIS — Z23 Encounter for immunization: Secondary | ICD-10-CM | POA: Diagnosis not present

## 2022-06-06 DIAGNOSIS — K219 Gastro-esophageal reflux disease without esophagitis: Secondary | ICD-10-CM | POA: Diagnosis not present

## 2022-06-06 DIAGNOSIS — I10 Essential (primary) hypertension: Secondary | ICD-10-CM | POA: Diagnosis not present

## 2022-07-06 IMAGING — DX DG KNEE COMPLETE 4+V*R*
4 series · 4 of 4 positions shown · non-contrast
Comparison: Right knee series 03/19/2016.

CLINICAL DATA: 81-year-old female status post fall in the emergency
department hallway. Pain.

EXAM:
RIGHT KNEE - COMPLETE 4+ VIEW

[knee ap]
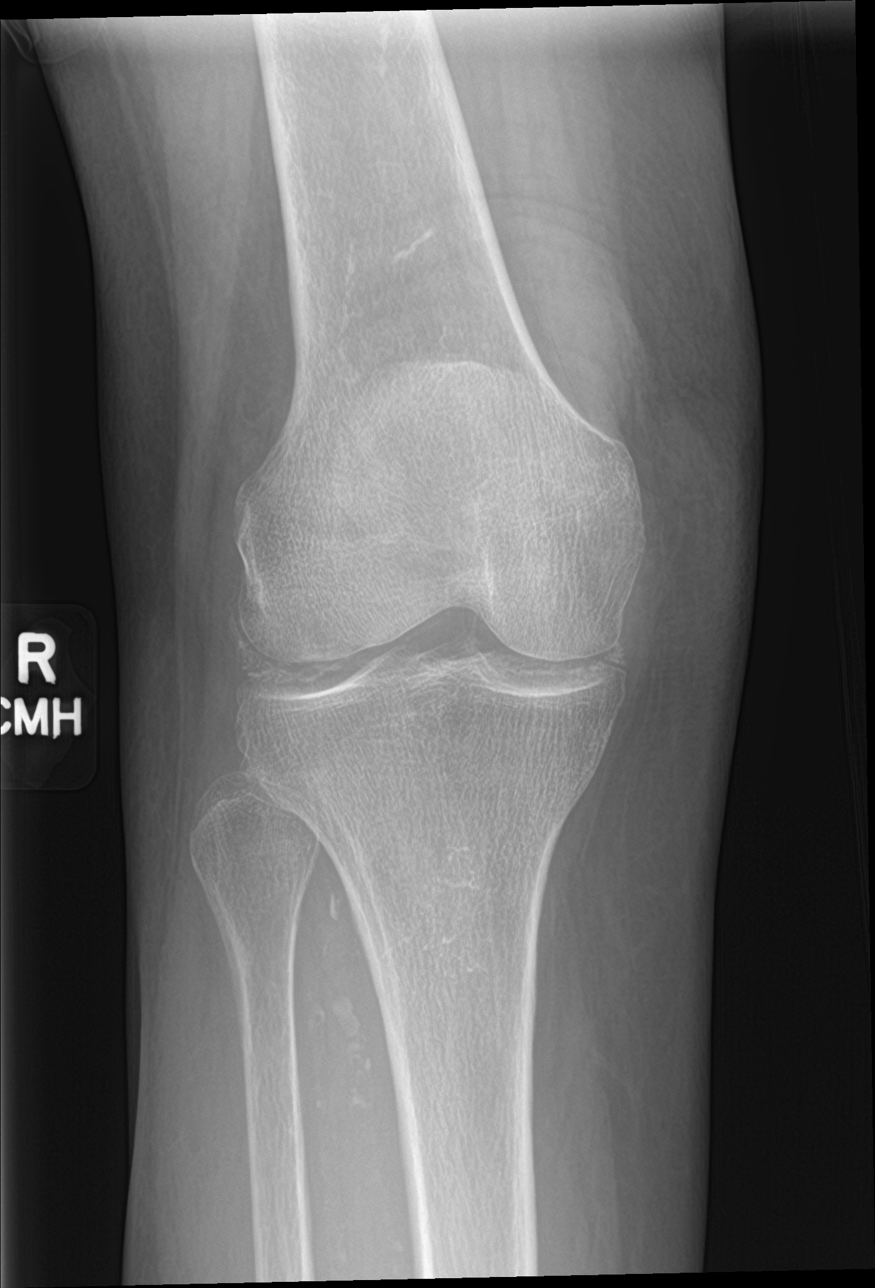

[knee obl (1 of 2)]
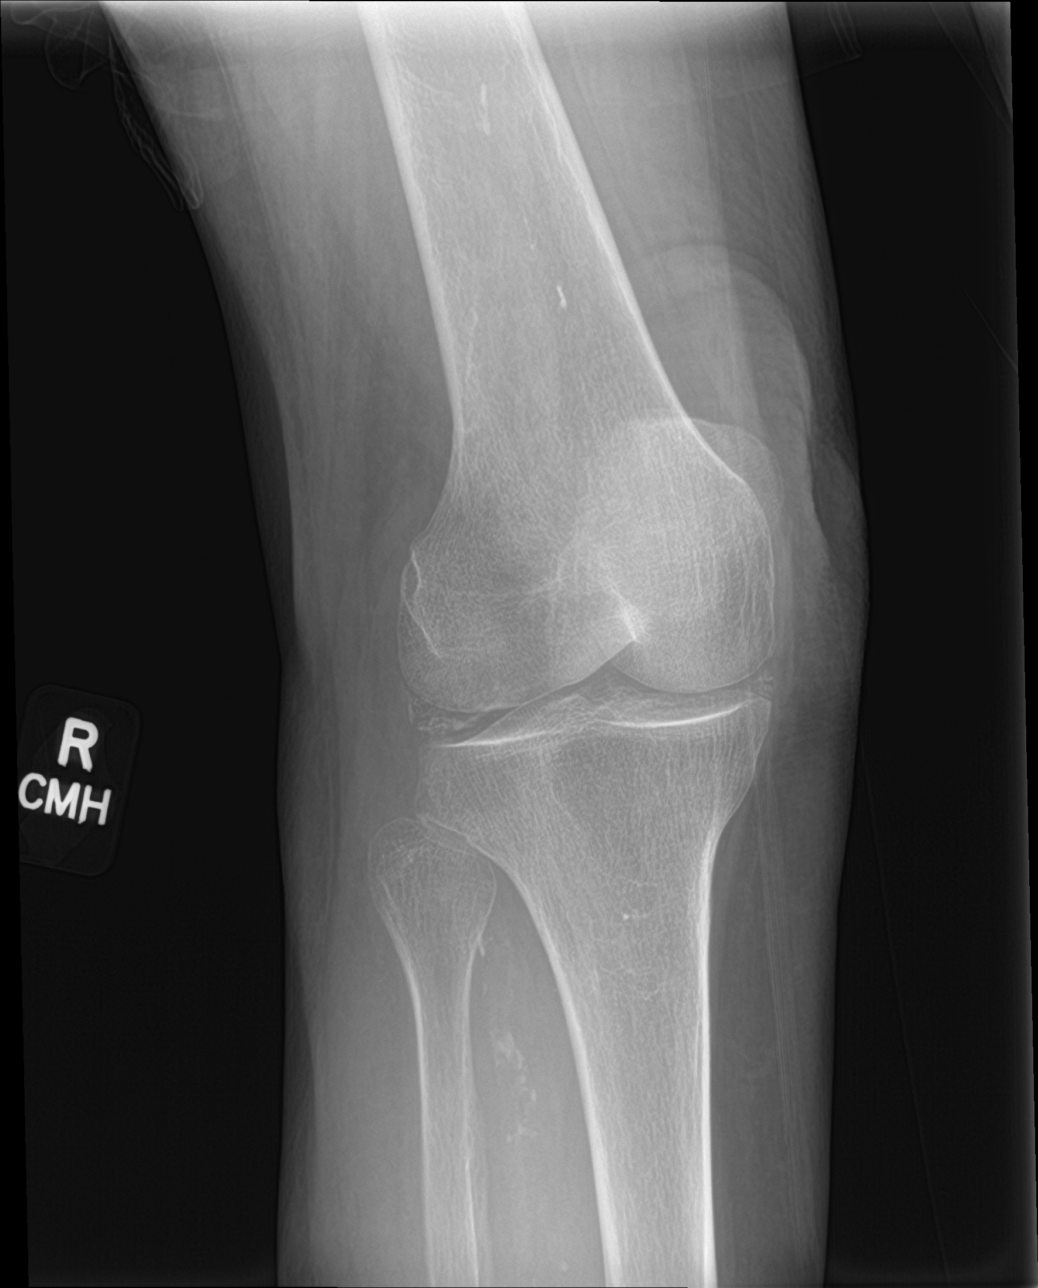

[knee obl (2 of 2)]
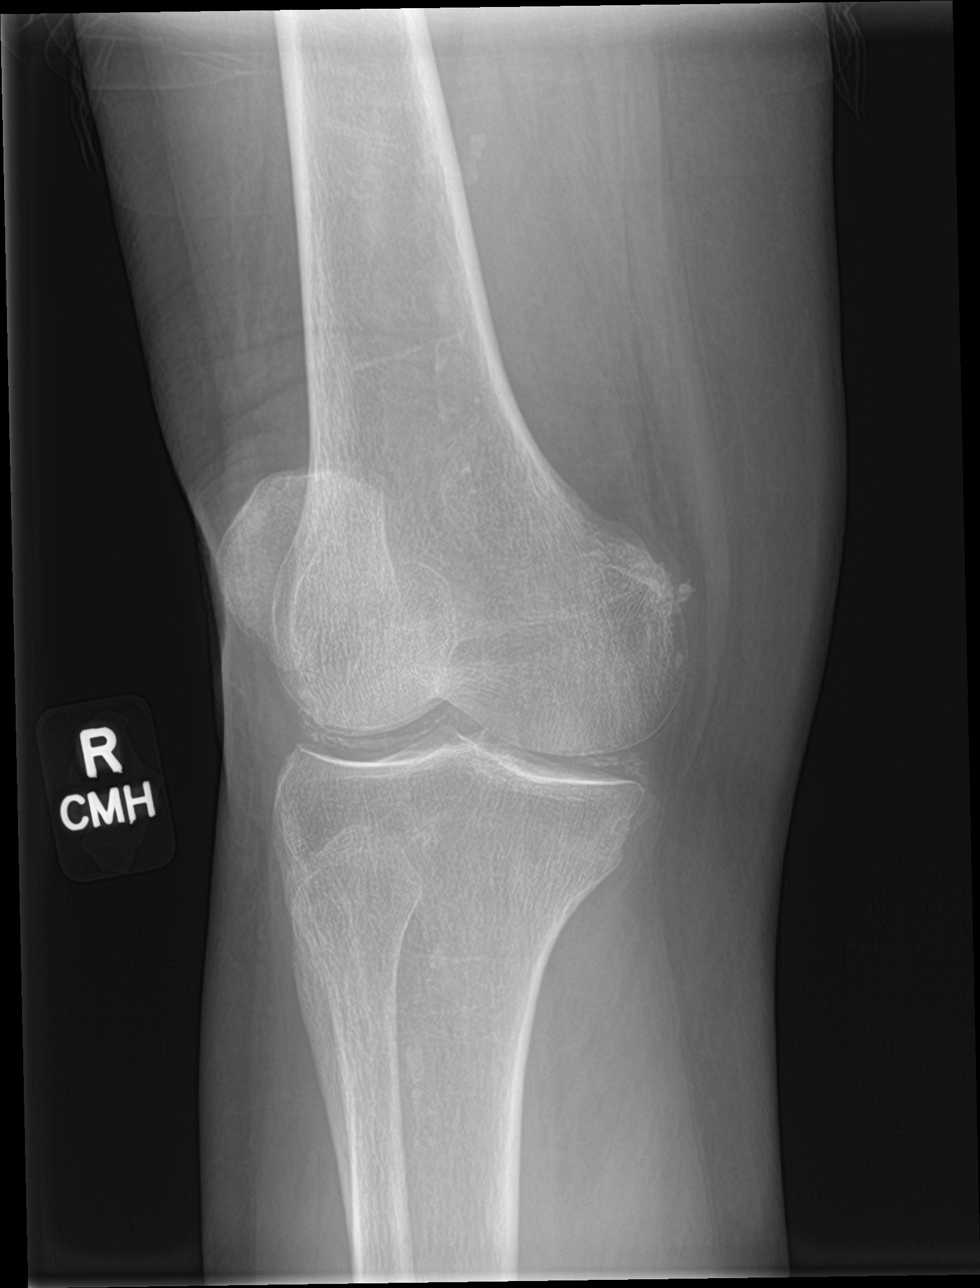

[knee lat]
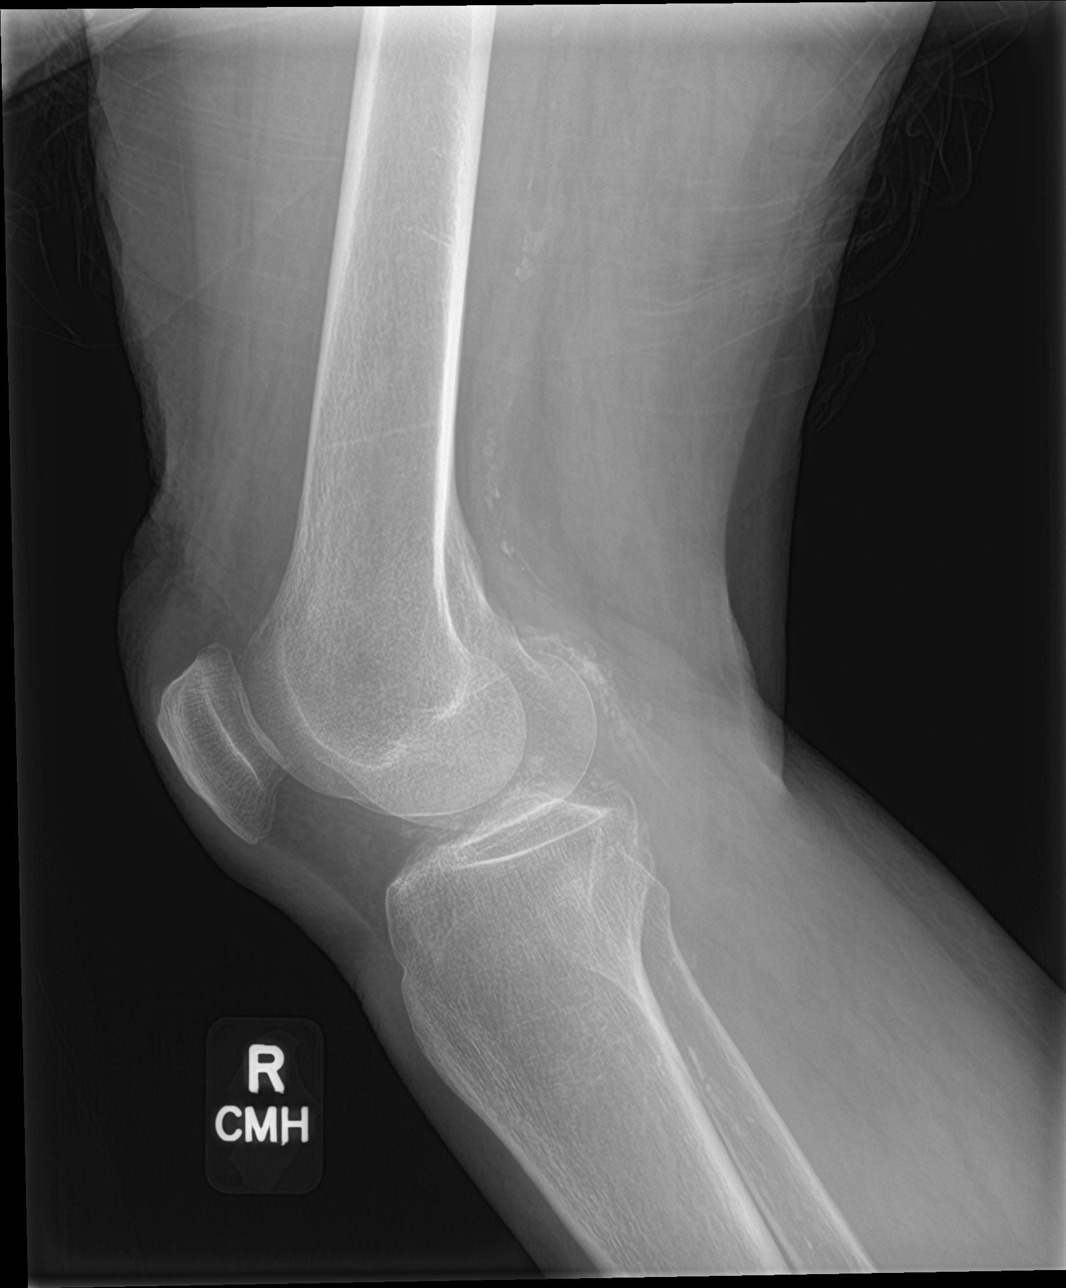

[4 of 4 positions shown; findings below may reference images not displayed]

FINDINGS: Chronic Chondrocalcinosis which can be seen in the setting of
calcium pyrophosphate deposition disease. Stable joint spaces and
alignment since 0811. Patella intact. No joint effusion identified.
No acute osseous abnormality identified. Calcified peripheral
vascular disease.
IMPRESSION: No acute fracture or dislocation identified about the right knee.

## 2022-07-06 IMAGING — US US FNA BIOPSY THYROID 1ST LESION
1 series · 13 of 18 positions shown · non-contrast
Comparison: US Thyroid 12/26/21

MEDICATIONS:
10 cc 1% lidocaine

COMPLICATIONS:
None immediate.

INDICATION: Left mid lobe thyroid nodule

4.2 cm
EXAM:
ULTRASOUND GUIDED FINE NEEDLE ASPIRATION OF INDETERMINATE THYROID
NODULE
TECHNIQUE: Informed written consent was obtained from the patient after a
discussion of the risks, benefits and alternatives to treatment.
Questions regarding the procedure were encouraged and answered. A
timeout was performed prior to the initiation of the procedure.

[Series 1: us fna bx thyroid 1st lesion afirma · 18 acquisitions, 13 frames shown]
[im 1/18]
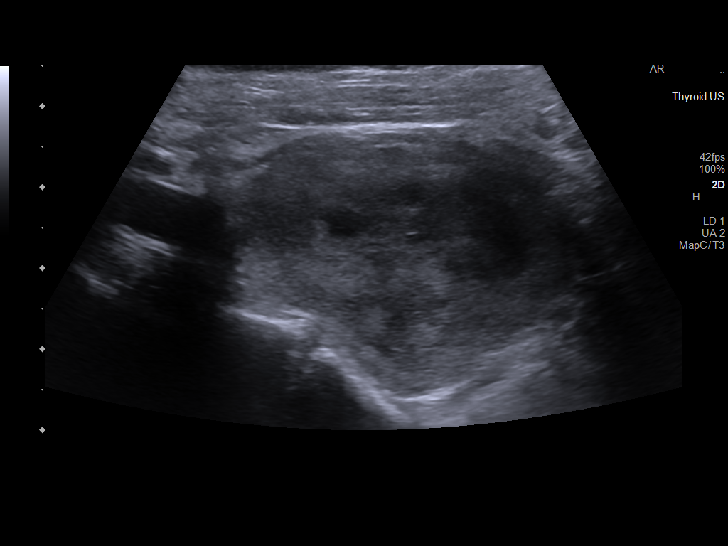
[im 3/18]
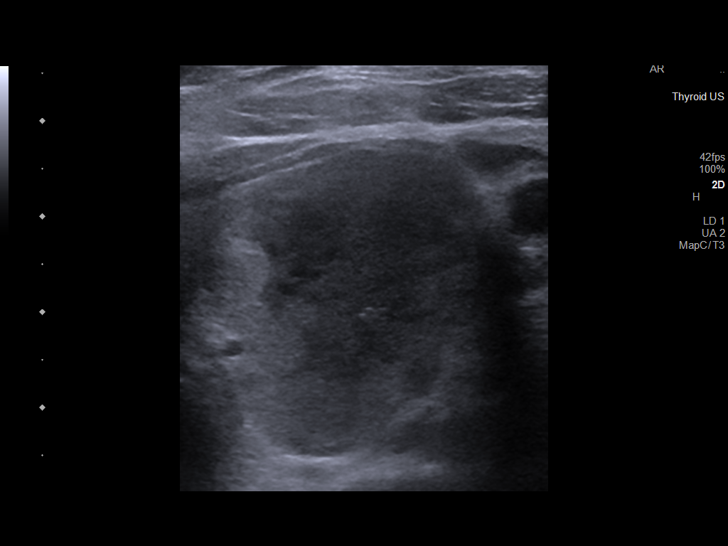
[im 4/18]
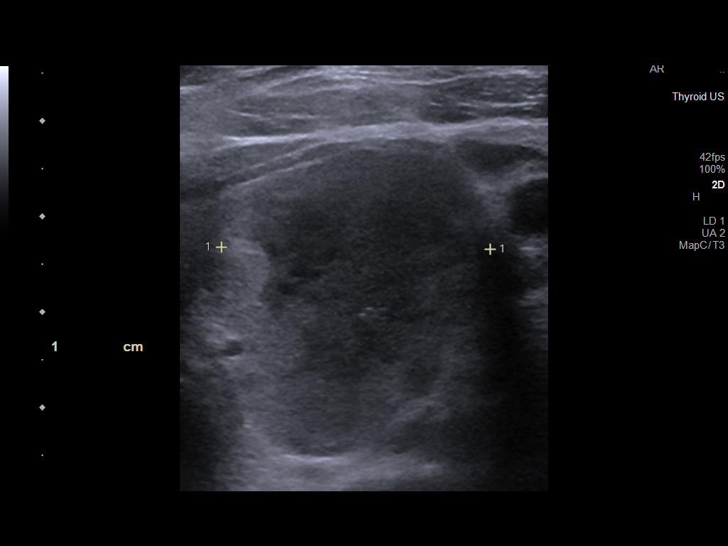
[im 5/18]
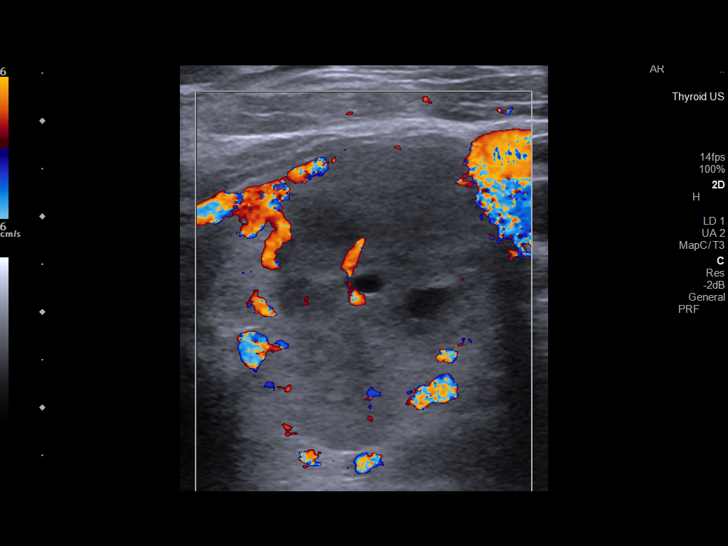
[im 7/18]
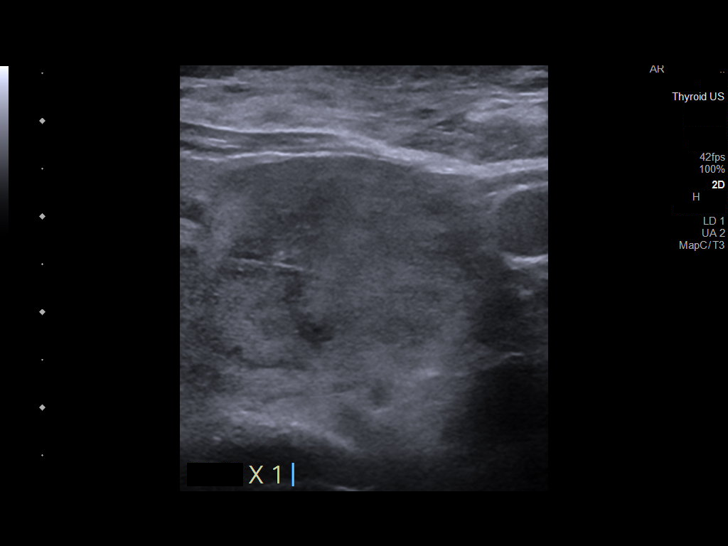
[im 8/18]
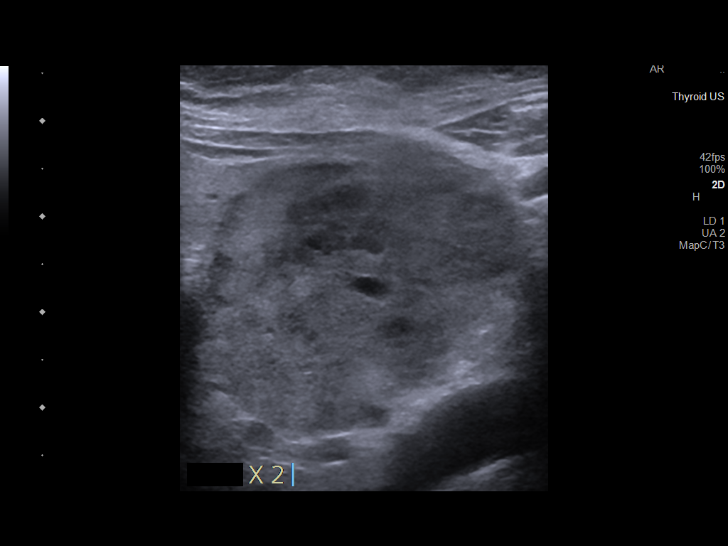
[im 10/18]
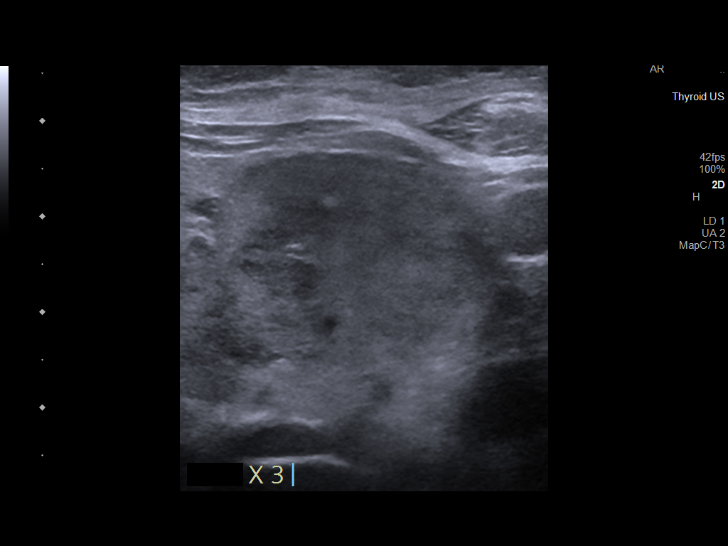
[im 11/18]
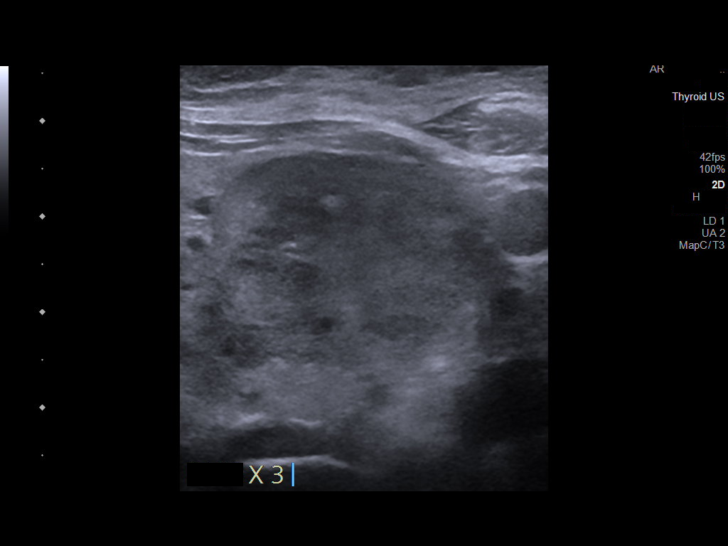
[im 12/18]
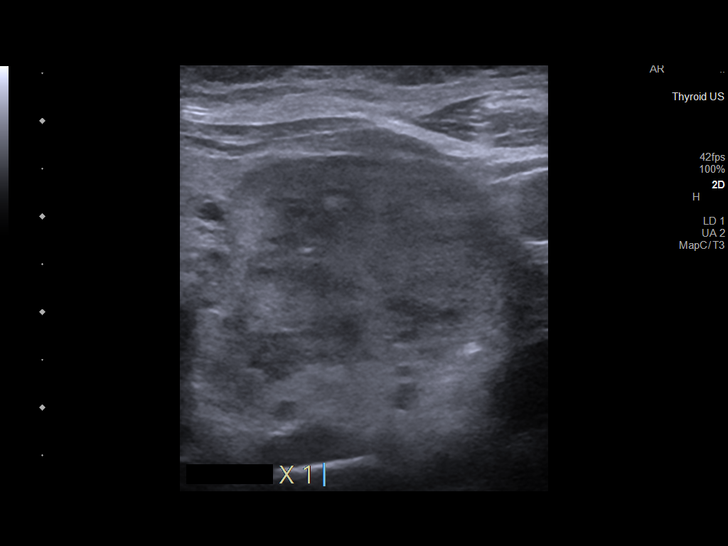
[im 14/18]
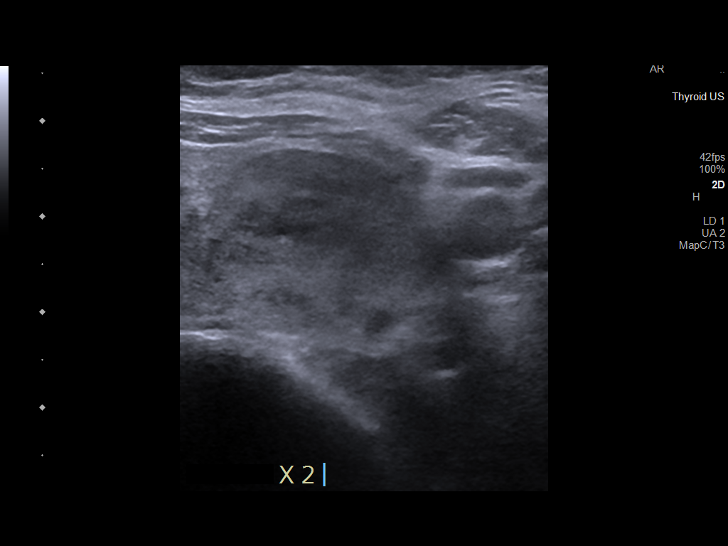
[im 15/18]
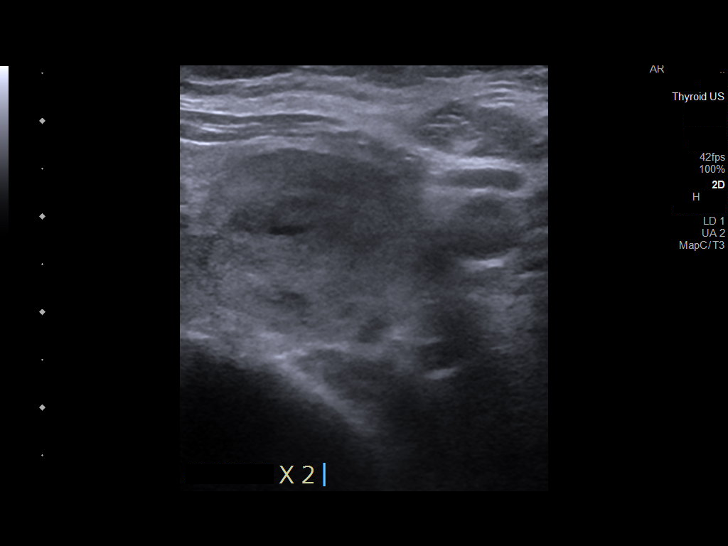
[im 16/18]
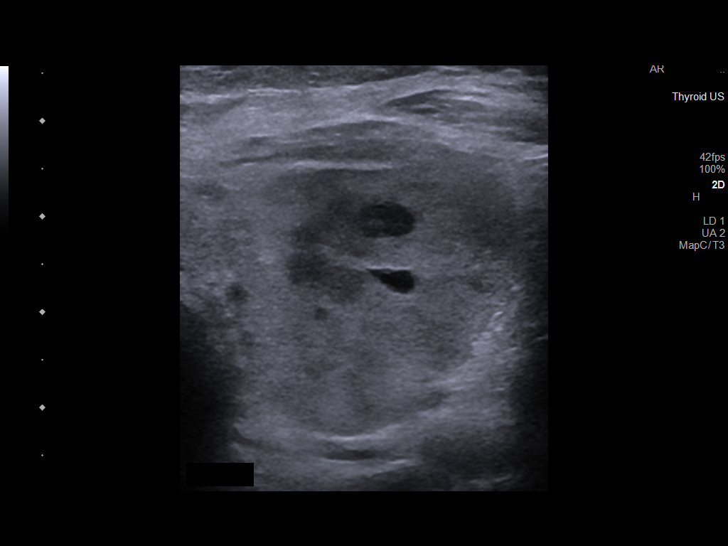
[im 18/18]
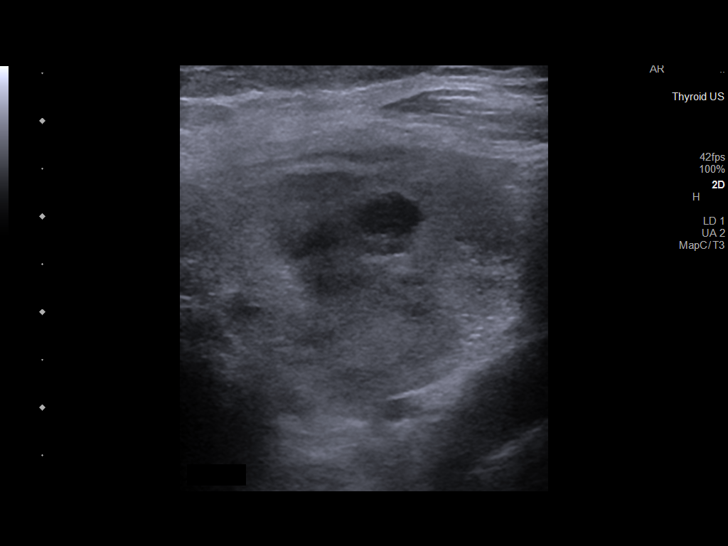

[13 of 18 positions shown; findings below may reference images not displayed]

Pre-procedural ultrasound scanning demonstrated unchanged size and
appearance of the indeterminate nodule within the left thyroid

The procedure was planned. The neck was prepped in the usual sterile
fashion, and a sterile drape was applied covering the operative
field. A timeout was performed prior to the initiation of the
procedure. Local anesthesia was provided with 1% lidocaine.

Under direct ultrasound guidance, 5 FNA biopsies were performed of
the left mid lobe thyroid nodule with a 25 gauge needle.

2 of these samples were obtained for AFIRMA

Multiple ultrasound images were saved for procedural documentation
purposes. The samples were prepared and submitted to pathology.

Limited post procedural scanning was negative for hematoma or
additional complication. Dressings were placed. The patient
tolerated the above procedures procedure well without immediate
postprocedural complication.
FINDINGS: Nodule reference number based on prior diagnostic ultrasound: 1

Maximum size: 4.2 cm

Location: Left; Mid

ACR TI-RADS risk category: TR4 (4-6 points)

Reason for biopsy: meets ACR TI-RADS criteria

Ultrasound imaging confirms appropriate placement of the needles
within the thyroid nodule.
IMPRESSION: Technically successful ultrasound guided fine needle aspiration of
left mid lobe thyroid nodule

Read by

Mirzaga Eran

## 2022-09-02 DIAGNOSIS — Z79899 Other long term (current) drug therapy: Secondary | ICD-10-CM | POA: Diagnosis not present

## 2022-09-02 DIAGNOSIS — N1832 Chronic kidney disease, stage 3b: Secondary | ICD-10-CM | POA: Diagnosis not present

## 2022-09-02 DIAGNOSIS — I10 Essential (primary) hypertension: Secondary | ICD-10-CM | POA: Diagnosis not present

## 2022-09-02 DIAGNOSIS — K219 Gastro-esophageal reflux disease without esophagitis: Secondary | ICD-10-CM | POA: Diagnosis not present

## 2022-09-09 DIAGNOSIS — N1832 Chronic kidney disease, stage 3b: Secondary | ICD-10-CM | POA: Diagnosis not present

## 2022-09-09 DIAGNOSIS — I10 Essential (primary) hypertension: Secondary | ICD-10-CM | POA: Diagnosis not present

## 2022-12-02 DIAGNOSIS — I10 Essential (primary) hypertension: Secondary | ICD-10-CM | POA: Diagnosis not present

## 2022-12-02 DIAGNOSIS — Z79899 Other long term (current) drug therapy: Secondary | ICD-10-CM | POA: Diagnosis not present

## 2022-12-09 DIAGNOSIS — I10 Essential (primary) hypertension: Secondary | ICD-10-CM | POA: Diagnosis not present

## 2022-12-09 DIAGNOSIS — N1832 Chronic kidney disease, stage 3b: Secondary | ICD-10-CM | POA: Diagnosis not present

## 2022-12-09 DIAGNOSIS — G629 Polyneuropathy, unspecified: Secondary | ICD-10-CM | POA: Diagnosis not present

## 2022-12-24 DIAGNOSIS — I739 Peripheral vascular disease, unspecified: Secondary | ICD-10-CM | POA: Diagnosis not present

## 2022-12-24 DIAGNOSIS — M79671 Pain in right foot: Secondary | ICD-10-CM | POA: Diagnosis not present

## 2022-12-24 DIAGNOSIS — M79672 Pain in left foot: Secondary | ICD-10-CM | POA: Diagnosis not present

## 2022-12-24 DIAGNOSIS — G600 Hereditary motor and sensory neuropathy: Secondary | ICD-10-CM | POA: Diagnosis not present

## 2022-12-26 ENCOUNTER — Encounter: Payer: Self-pay | Admitting: Orthopedic Surgery

## 2022-12-26 ENCOUNTER — Ambulatory Visit (INDEPENDENT_AMBULATORY_CARE_PROVIDER_SITE_OTHER): Payer: HMO | Admitting: Orthopedic Surgery

## 2022-12-26 ENCOUNTER — Other Ambulatory Visit (INDEPENDENT_AMBULATORY_CARE_PROVIDER_SITE_OTHER): Payer: HMO

## 2022-12-26 VITALS — Ht 65.5 in | Wt 180.0 lb

## 2022-12-26 DIAGNOSIS — M25511 Pain in right shoulder: Secondary | ICD-10-CM | POA: Diagnosis not present

## 2022-12-26 DIAGNOSIS — M25811 Other specified joint disorders, right shoulder: Secondary | ICD-10-CM | POA: Diagnosis not present

## 2022-12-26 MED ORDER — METHYLPREDNISOLONE ACETATE 40 MG/ML IJ SUSP
40.0000 mg | Freq: Once | INTRAMUSCULAR | Status: AC
  Administered 2022-12-26: 40 mg via INTRA_ARTICULAR

## 2022-12-26 NOTE — Addendum Note (Signed)
Addended byCaffie Damme on: 12/26/2022 09:47 AM   Modules accepted: Orders

## 2022-12-26 NOTE — Progress Notes (Signed)
Office Visit Note   Patient: Tricia Jenkins           Date of Birth: 01/19/1941           MRN: [id number] Visit Date: 12/26/2022 Requested by: Carylon PerchesFagan, Roy, MD [address] [place] [id number] PCP: Carylon PerchesFagan, Roy, MD  Subjective: Chief Complaint  Patient presents with   Shoulder Pain    2 months, decreased ROM, stings and hurts at shld joint, tylenol no relief.    HPI: 82 year old female presents with atraumatic onset of right shoulder pain unrelieved by Tylenol.  She reports difficulty raising her arm and painful forward elevation              ROS: No numbness tingling weakness trauma or neck pain  Assessment & Plan:  Images personally read and my interpretation : Images were done internally.  2 views of the shoulder the glenohumeral joint was normal the [facility] was typically arthritic  Visit Diagnoses:  1. Shoulder impingement, right   2. Acute pain of right shoulder     Plan: Codman exercises and subacromial injection   Procedure note the subacromial injection shoulder RIGHT    Verbal consent was obtained to inject the  RIGHT   Shoulder  Timeout was completed to confirm the injection site is a subacromial space of the  RIGHT  shoulder   Medication used Depo-Medrol 40 mg and lidocaine 1% 3 cc  Anesthesia was provided by ethyl chloride  The injection was performed in the RIGHT  posterior subacromial space. After pinning the skin with alcohol and anesthetized the skin with ethyl chloride the subacromial space was injected using a 20-gauge needle. There were no complications  Sterile dressing was applied.    Follow-Up Instructions: Return if symptoms worsen or fail to improve.   Orders:  No orders of the defined types were placed in this encounter.     Objective: Vital Signs: Ht 5' 5.5" (1.664 m)   Wt 180 lb (81.6 kg)   BMI 29.50 kg/m   Physical Exam Vitals and nursing note reviewed.  Constitutional:      Appearance: Normal appearance.   HENT:     Head: Normocephalic and atraumatic.  Eyes:     General: No scleral icterus.       Right eye: No discharge.        Left eye: No discharge.     Extraocular Movements: Extraocular movements intact.     Conjunctiva/sclera: Conjunctivae normal.     Pupils: Pupils are equal, round, and reactive to light.  Cardiovascular:     Rate and Rhythm: Normal rate.     Pulses: Normal pulses.  Skin:    General: Skin is warm and dry.     Capillary Refill: Capillary refill takes less than 2 seconds.  Neurological:     General: No focal deficit present.     Mental Status: She is alert and oriented to person, place, and time.  Psychiatric:        Mood and Affect: Mood normal.        Behavior: Behavior normal.        Thought Content: Thought content normal.        Judgment: Judgment normal.      Right Shoulder Exam   Tenderness  The patient is experiencing no tenderness.  Range of Motion  Active abduction:  abnormal  Passive abduction:  abnormal  Extension:  normal  External rotation:  normal  Forward flexion:  abnormal  Internal rotation 0 degrees:  normal   Muscle Strength  The patient has normal right shoulder strength.  Tests  Apprehension: negative Hawkins test: negative Cross arm: negative Impingement: positive Drop arm: negative Sulcus: absent  Other  Sensation: normal Pulse: present       Specialty Comments:  No specialty comments available.  Imaging: DG Shoulder Right  Result Date: 12/26/2022 X-ray report Chief complaint right shoulder pain no trauma Images AP lateral Reading: Normal glenohumeral joint, arthritis in the [facility] joint, no evidence of fracture or subluxation Impression: Normal right shoulder with AC joint arthritis    PMFS History: Patient Active Problem List   Diagnosis Date Noted   Closed nondisplaced transverse fracture of patella with routine healing 02/19/2022   Hypomagnesemia 12/08/2021   Hypokalemia 12/06/2021   Pyelonephritis  of left kidney 12/05/2021   Sepsis RULED OUT 12/05/2021   Essential hypertension 12/05/2021   GERD without esophagitis 12/05/2021   Acute kidney injury superimposed on chronic kidney disease 12/05/2021   Generalized abdominal pain    Complex sclerosing lesion of left breast    Encounter for screening colonoscopy 09/08/2012   Focal lymphocytic colitis 09/08/2012   COLONIC POLYPS, HX OF 08/20/2010   GERD 08/14/2010   Past Medical History:  Diagnosis Date   Arthritis    Erosive esophagitis    GERD (gastroesophageal reflux disease)    HTN (hypertension)    Internal hemorrhoids 12/02/2002    Family History  Problem Relation Age of Onset   CAD Mother        deceased age 30   Pneumonia Father        deceased age 26/also with h/o CVA   Colon cancer Neg Hx     Past Surgical History:  Procedure Laterality Date   APPENDECTOMY     BREAST LUMPECTOMY WITH RADIOFREQUENCY TAG IDENTIFICATION Left 11/01/2021   Procedure: BREAST LUMPECTOMY WITH RADIOFREQUENCY TAG IDENTIFICATION;  Surgeon: Lewie Chamber, DO;  Location: [facility];  Service: General;  Laterality: Left;   BUNIONECTOMY     bilateral x5   COLONOSCOPY  10/07/2012   RMR: Colorectal polyps-treated and/or removed as described above. Colonic diverticulosis.(serrated adenomas) next TCS 5 years   COLONOSCOPY W/ BIOPSIES  12/08/2002   RMR: Internal hemorrhoids, otherwise normal rectum Polyp at endocecum cold biopsied/removed Abnormal appearing ileocecal valve (of doubtful clinical significance)  biopsy showed chronic active colitis with significant lymphocytic infiltrate   ESOPHAGOGASTRODUODENOSCOPY  04/28/2007   RMR: Distal esophageal erosions consistent with mild to moderate  reflux esophagitis, otherwise, normal esophagus, small hiatal hernia; otherwise, normal stomach, first and second duodenum   EXCISION OF BREAST BIOPSY Left 11/01/2021   Procedure: EXCISION OF BREAST BIOPSY;  Surgeon: Lewie Chamber, DO;  Location: [facility];   Service: General;  Laterality: Left;   HEMORRHOID SURGERY     KIDNEY STONE SURGERY     SHOULDER SURGERY     left   Social History   Occupational History   Occupation: Retired [employer]  Tobacco Use   Smoking status: Former    Packs/day: 1.00    Years: 15.00    Additional pack years: 0.00    Total pack years: 15.00    Types: Cigarettes   Smokeless tobacco: Never  Substance and Sexual Activity   Alcohol use: No   Drug use: No   Sexual activity: Not on file

## 2023-01-21 DIAGNOSIS — I10 Essential (primary) hypertension: Secondary | ICD-10-CM | POA: Diagnosis not present

## 2023-01-21 DIAGNOSIS — H9193 Unspecified hearing loss, bilateral: Secondary | ICD-10-CM | POA: Diagnosis not present

## 2023-01-21 DIAGNOSIS — G8929 Other chronic pain: Secondary | ICD-10-CM | POA: Diagnosis not present

## 2023-01-21 DIAGNOSIS — Z87891 Personal history of nicotine dependence: Secondary | ICD-10-CM | POA: Diagnosis not present

## 2023-01-21 DIAGNOSIS — K219 Gastro-esophageal reflux disease without esophagitis: Secondary | ICD-10-CM | POA: Diagnosis not present

## 2023-01-21 DIAGNOSIS — E663 Overweight: Secondary | ICD-10-CM | POA: Diagnosis not present

## 2023-03-24 DIAGNOSIS — D485 Neoplasm of uncertain behavior of skin: Secondary | ICD-10-CM | POA: Diagnosis not present

## 2023-03-24 DIAGNOSIS — Z1283 Encounter for screening for malignant neoplasm of skin: Secondary | ICD-10-CM | POA: Diagnosis not present

## 2023-03-24 DIAGNOSIS — Z85828 Personal history of other malignant neoplasm of skin: Secondary | ICD-10-CM | POA: Diagnosis not present

## 2023-03-26 ENCOUNTER — Other Ambulatory Visit: Payer: Self-pay

## 2023-03-26 ENCOUNTER — Encounter (HOSPITAL_COMMUNITY): Payer: Self-pay | Admitting: *Deleted

## 2023-03-26 ENCOUNTER — Emergency Department (HOSPITAL_COMMUNITY)
Admission: EM | Admit: 2023-03-26 | Discharge: 2023-03-26 | Disposition: A | Discharge status: Home / Self Care | Payer: HMO | Attending: Emergency Medicine | Admitting: Emergency Medicine

## 2023-03-26 DIAGNOSIS — M5431 Sciatica, right side: Secondary | ICD-10-CM | POA: Insufficient documentation

## 2023-03-26 DIAGNOSIS — Z87891 Personal history of nicotine dependence: Secondary | ICD-10-CM | POA: Insufficient documentation

## 2023-03-26 DIAGNOSIS — Z79899 Other long term (current) drug therapy: Secondary | ICD-10-CM | POA: Insufficient documentation

## 2023-03-26 DIAGNOSIS — M5441 Lumbago with sciatica, right side: Secondary | ICD-10-CM | POA: Diagnosis not present

## 2023-03-26 DIAGNOSIS — M79604 Pain in right leg: Secondary | ICD-10-CM | POA: Diagnosis present

## 2023-03-26 DIAGNOSIS — I10 Essential (primary) hypertension: Secondary | ICD-10-CM | POA: Insufficient documentation

## 2023-03-26 MED ORDER — ACETAMINOPHEN 325 MG PO TABS: 650.0000 mg | ORAL_TABLET | Freq: Four times a day (QID) | ORAL | 0 refills | Status: AC | PRN

## 2023-03-26 MED ORDER — OXYCODONE HCL 5 MG PO TABS: 5.0000 mg | ORAL_TABLET | Freq: Four times a day (QID) | ORAL | 0 refills | Status: AC | PRN

## 2023-03-26 MED ORDER — CYCLOBENZAPRINE HCL 5 MG PO TABS: 5.0000 mg | ORAL_TABLET | Freq: Every day | ORAL | 0 refills | Status: DC | PRN

## 2023-03-26 NOTE — ED Provider Notes (Signed)
[place] EMERGENCY DEPARTMENT AT [facility] Provider Note  CSN: [id number] Arrival date & time: 03/26/23 9562  Chief Complaint(s) Hip Pain  HPI Tricia Jenkins is a 82 y.o. female with past medical history as below, significant for arthritis, hypertension, GERD who presents to the ED with complaint of right leg pain.  Patient reports 2 days of right leg pain, began at her right hip has progressed down the length of her right leg.  No recent falls or injuries, no history of spinal injections, no back pain.  No fevers.  No IV drug use.  She is ambulatory.  She took Tylenol at home without much improvement.  No numbness or tingling to her feet.  No rashes or swelling to her legs.  No abdominal pain, change in bowel or bladder function.   Past Medical History Past Medical History:  Diagnosis Date   Arthritis    Erosive esophagitis    GERD (gastroesophageal reflux disease)    HTN (hypertension)    Internal hemorrhoids 12/02/2002   Patient Active Problem List   Diagnosis Date Noted   Closed nondisplaced transverse fracture of patella with routine healing 02/19/2022   Hypomagnesemia 12/08/2021   Hypokalemia 12/06/2021   Pyelonephritis of left kidney 12/05/2021   Sepsis RULED OUT 12/05/2021   Essential hypertension 12/05/2021   GERD without esophagitis 12/05/2021   Acute kidney injury superimposed on chronic kidney disease (HCC) 12/05/2021   Generalized abdominal pain    Complex sclerosing lesion of left breast    Encounter for screening colonoscopy 09/08/2012   Focal lymphocytic colitis 09/08/2012   COLONIC POLYPS, HX OF 08/20/2010   GERD 08/14/2010   Home Medication(s) Prior to Admission medications   Medication Sig Start Date End Date Taking? Authorizing Provider  acetaminophen (TYLENOL) 325 MG tablet Take 2 tablets (650 mg total) by mouth every 6 (six) hours as needed. 03/26/23  Yes Tanda Rockers A, DO  oxyCODONE (ROXICODONE) 5 MG immediate release tablet Take 1 tablet (5  mg total) by mouth every 6 (six) hours as needed for severe pain. 03/26/23  Yes Tanda Rockers A, DO  amLODipine (NORVASC) 5 MG tablet Take 5 mg by mouth daily.      [provider]  cyclobenzaprine (FLEXERIL) 10 MG tablet Take 1 tablet (10 mg total) by mouth 2 (two) times daily as needed for muscle spasms. Patient not taking: Reported on 02/18/2022 12/15/21   Carroll Sage, PA-C  gabapentin (NEURONTIN) 300 MG capsule Take 1 capsule (300 mg total) by mouth at bedtime. 12/08/21   Johnson, Clanford L, MD  hydrochlorothiazide (HYDRODIURIL) 25 MG tablet Take 25 mg by mouth every morning. 02/05/22   [provider]  omeprazole (PRILOSEC) 20 MG capsule Take 1 capsule (20 mg total) by mouth daily. 12/08/21   Cleora Fleet, MD  Past Surgical History Past Surgical History:  Procedure Laterality Date   APPENDECTOMY     BREAST LUMPECTOMY WITH RADIOFREQUENCY TAG IDENTIFICATION Left 11/01/2021   Procedure: BREAST LUMPECTOMY WITH RADIOFREQUENCY TAG IDENTIFICATION;  Surgeon: Lewie Chamber, DO;  Location: [facility];  Service: General;  Laterality: Left;   BUNIONECTOMY     bilateral x5   COLONOSCOPY  10/07/2012   RMR: Colorectal polyps-treated and/or removed as described above. Colonic diverticulosis.(serrated adenomas) next TCS 5 years   COLONOSCOPY W/ BIOPSIES  12/08/2002   RMR: Internal hemorrhoids, otherwise normal rectum Polyp at endocecum cold biopsied/removed Abnormal appearing ileocecal valve (of doubtful clinical significance)  biopsy showed chronic active colitis with significant lymphocytic infiltrate   ESOPHAGOGASTRODUODENOSCOPY  04/28/2007   RMR: Distal esophageal erosions consistent with mild to moderate  reflux esophagitis, otherwise, normal esophagus, small hiatal hernia; otherwise, normal stomach, Jenkins and second duodenum   EXCISION OF  BREAST BIOPSY Left 11/01/2021   Procedure: EXCISION OF BREAST BIOPSY;  Surgeon: Lewie Chamber, DO;  Location: [facility];  Service: General;  Laterality: Left;   HEMORRHOID SURGERY     KIDNEY STONE SURGERY     SHOULDER SURGERY     left   Family History Family History  Problem Relation Age of Onset   CAD Mother        deceased age 77   Pneumonia Father        deceased age 18/also with h/o CVA   Colon cancer Neg Hx     Social History Social History   Tobacco Use   Smoking status: Former    Packs/day: 1.00    Years: 15.00    Additional pack years: 0.00    Total pack years: 15.00    Types: Cigarettes   Smokeless tobacco: Never  Vaping Use   Vaping Use: Never used  Substance Use Topics   Alcohol use: No   Drug use: No   Allergies Naproxen and Piroxicam  Review of Systems Review of Systems  Constitutional:  Negative for chills and fever.  HENT:  Negative for facial swelling and trouble swallowing.   Eyes:  Negative for photophobia and visual disturbance.  Respiratory:  Negative for cough and shortness of breath.   Cardiovascular:  Negative for chest pain and palpitations.  Gastrointestinal:  Negative for abdominal pain, nausea and vomiting.  Endocrine: Negative for polydipsia and polyuria.  Genitourinary:  Negative for difficulty urinating and hematuria.  Musculoskeletal:  Positive for arthralgias. Negative for gait problem and joint swelling.  Skin:  Negative for pallor and rash.  Neurological:  Negative for syncope and headaches.  Psychiatric/Behavioral:  Negative for agitation and confusion.     Physical Exam Vital Signs  I have reviewed the triage vital signs BP (!) 159/69 (BP Location: Left Arm)   Pulse 67   Temp 97.8 F (36.6 C) (Oral)   Resp 16   Ht 5' 5.5" (1.664 m)   Wt 82.6 kg   SpO2 97%   BMI 29.83 kg/m  Physical Exam Vitals and nursing note reviewed.  Constitutional:      General: She is not in acute distress.    Appearance: Normal  appearance. She is not ill-appearing.  HENT:     Head: Normocephalic and atraumatic.     Right Ear: External ear normal.     Left Ear: External ear normal.     Nose: Nose normal.     Mouth/Throat:     Mouth: Mucous membranes are moist.  Eyes:  General: No scleral icterus.       Right eye: No discharge.        Left eye: No discharge.  Cardiovascular:     Rate and Rhythm: Normal rate.     Pulses: Normal pulses.  Pulmonary:     Effort: Pulmonary effort is normal. No respiratory distress.     Breath sounds: No stridor.  Abdominal:     General: Abdomen is flat. There is no distension.  Musculoskeletal:        General: No swelling.     Right lower leg: No edema.     Left lower leg: No edema.       Legs:     Comments: No midline spinous process tenderness palpation or percussion, no crepitance or step-off to midline spine.  Achilles tendon intact bilateral.  No pain to either knee with provocative testing.  No lower extremity edema or erythema.  LE NVI bilateral.  Strength 5/5 bilateral lower extremities.  Compartments are soft length of bilateral lower extremities     Skin:    General: Skin is warm and dry.     Capillary Refill: Capillary refill takes less than 2 seconds.  Neurological:     Mental Status: She is alert.  Psychiatric:        Mood and Affect: Mood normal.        Behavior: Behavior normal.     ED Results and Treatments Labs (all labs ordered are listed, but only abnormal results are displayed) Labs Reviewed - No data to display                                                                                                                        Radiology No results found.  Pertinent labs & imaging results that were available during my care of the patient were reviewed by me and considered in my medical decision making (see MDM for details).  Medications Ordered in ED Medications - No data to display                                                                                                                                    Procedures Procedures  (including critical care time)  Medical Decision Making / ED Course    Medical Decision Making:    Tricia Jenkins is a 82 y.o. female with past medical history as below, significant for arthritis,  hypertension, GERD who presents to the ED with complaint of right leg pain.. The complaint involves an extensive differential diagnosis and also carries with it a high risk of complications and morbidity.  Serious etiology was considered. Ddx includes but is not limited to: Sprain, strain, soft tissue injury, fracture, MSK syndrome, sciatica, IT band syndrome, unlikely compartment syndrome or fracture, etc.  Complete initial physical exam performed, notably the patient  was no acute distress, sitting on edge of bed.    Reviewed and confirmed nursing documentation for past medical history, family history, social history.  Vital signs reviewed.      She denies back pain.  No fevers or rashes.  No trauma.  She is overall well-appearing, ambulatory with steady gait.  Neuro intact.  LE NVI.  Strength symmetric bilateral lower extremities.  Concern for possible sciatica versus IT band syndrome.  Discussed supportive care at home, given analgesics.  Activity restrictions encouraged.  Follow-up orthopedics  The patient improved significantly and was discharged in stable condition. Detailed discussions were had with the patient regarding current findings, and need for close f/u with PCP or on call doctor. The patient has been instructed to return immediately if the symptoms worsen in any way for re-evaluation. Patient verbalized understanding and is in agreement with current care plan. All questions answered prior to discharge.         Additional history obtained: -Additional history obtained from na -External records from outside source obtained and reviewed including: Chart review including previous  notes, labs, imaging, consultation notes including primary care recommendation, prior labs and imaging medications   Lab Tests: na  EKG   EKG Interpretation Date/Time:    Ventricular Rate:    PR Interval:    QRS Duration:    QT Interval:    QTC Calculation:   R Axis:      Text Interpretation:           Imaging Studies ordered: na   Medicines ordered and prescription drug management: Meds ordered this encounter  Medications   oxyCODONE (ROXICODONE) 5 MG immediate release tablet    Sig: Take 1 tablet (5 mg total) by mouth every 6 (six) hours as needed for severe pain.    Dispense:  5 tablet    Refill:  0   acetaminophen (TYLENOL) 325 MG tablet    Sig: Take 2 tablets (650 mg total) by mouth every 6 (six) hours as needed.    Dispense:  36 tablet    Refill:  0    -I have reviewed the patients home medicines and have made adjustments as needed   Consultations Obtained: na   Cardiac Monitoring: na  Social Determinants of Health:  Diagnosis or treatment significantly limited by social determinants of health: former smoker   Reevaluation: After the interventions noted above, I reevaluated the patient and found that they have stayed the same  Co morbidities that complicate the patient evaluation  Past Medical History:  Diagnosis Date   Arthritis    Erosive esophagitis    GERD (gastroesophageal reflux disease)    HTN (hypertension)    Internal hemorrhoids 12/02/2002      Dispostion: Disposition decision including need for hospitalization was considered, and patient discharged from emergency department.    Final Clinical Impression(s) / ED Diagnoses Final diagnoses:  Sciatica of right side  Pain of right lower extremity     This chart was dictated using voice recognition software.  Despite best efforts to proofread,  errors can occur which  can change the documentation meaning.    Tanda Rockers A, DO 03/26/23 1054

## 2023-03-26 NOTE — ED Notes (Signed)
See triage notes. Nad.  

## 2023-03-26 NOTE — ED Triage Notes (Signed)
Pt c/o right hip pain x 3 days with pain radiating down to bottom of leg that started today. Denies injury. Pt used Tylenol when it first started and was helping then, but since last night it hasn't been helping.

## 2023-03-26 NOTE — Discharge Instructions (Addendum)
It was a pleasure caring for you today in the emergency department. ° °Please return to the emergency department for any worsening or worrisome symptoms. ° ° °

## 2023-03-28 DIAGNOSIS — M5431 Sciatica, right side: Secondary | ICD-10-CM | POA: Diagnosis not present

## 2023-03-28 DIAGNOSIS — E669 Obesity, unspecified: Secondary | ICD-10-CM | POA: Diagnosis not present

## 2023-03-28 DIAGNOSIS — Z6830 Body mass index (BMI) 30.0-30.9, adult: Secondary | ICD-10-CM | POA: Diagnosis not present

## 2023-03-31 ENCOUNTER — Encounter: Payer: Self-pay | Admitting: Orthopedic Surgery

## 2023-03-31 ENCOUNTER — Ambulatory Visit (INDEPENDENT_AMBULATORY_CARE_PROVIDER_SITE_OTHER): Payer: HMO | Admitting: Orthopedic Surgery

## 2023-03-31 ENCOUNTER — Other Ambulatory Visit (INDEPENDENT_AMBULATORY_CARE_PROVIDER_SITE_OTHER): Payer: HMO

## 2023-03-31 VITALS — BP 140/87 | HR 89 | Ht 65.5 in | Wt 183.0 lb

## 2023-03-31 DIAGNOSIS — M545 Low back pain, unspecified: Secondary | ICD-10-CM

## 2023-03-31 DIAGNOSIS — M79604 Pain in right leg: Secondary | ICD-10-CM | POA: Diagnosis not present

## 2023-03-31 DIAGNOSIS — M7061 Trochanteric bursitis, right hip: Secondary | ICD-10-CM | POA: Diagnosis not present

## 2023-03-31 MED ORDER — CYCLOBENZAPRINE HCL 10 MG PO TABS: 10.0000 mg | ORAL_TABLET | Freq: Two times a day (BID) | ORAL | 0 refills | Status: AC | PRN

## 2023-03-31 MED ORDER — PREDNISONE 10 MG (21) PO TBPK: ORAL_TABLET | ORAL | 0 refills | Status: AC

## 2023-03-31 NOTE — Patient Instructions (Signed)
I have prescribed medications for your right hip.  If you continue to have issues, and would like to consider an injection, please contact the clinic to schedule follow-up appointment.

## 2023-03-31 NOTE — Progress Notes (Signed)
New Patient Visit  Assessment: Tricia Jenkins is a 82 y.o. female with the following: 1. Lumbar pain with radiation down right leg 2. Greater trochanteric bursitis of right hip  Plan: Tricia Jenkins has some pain in the right buttock, as well as the right lateral hip.  We obtained x-rays of the lumbar spine in clinic today, which demonstrates diffuse degenerative changes.  No anterolisthesis.  She may have some low back pain, contributing to pain into the buttock.  However, the majority of her pain is over the lateral hip, in the area of the greater trochanteric bursa.  I believe this is causing her pain, which can also cause the radiating pain down the lateral aspect of her right leg.  We discussed multiple treatment options, and she would like to proceed with prednisone, as well as some more Flexeril.  If she continues to have issues, I would recommend a steroid injection.  This was discussed with the patient.  She states her understanding.  She will follow-up as needed.  Follow-up: Return if symptoms worsen or fail to improve.  Subjective:  Chief Complaint  Patient presents with   Back Pain    LBP w pain radiating down the R leg for 1 wk     History of Present Illness: Tricia Jenkins is a 82 y.o. female who presents for evaluation of right-sided hip pain.  She does have a history of low back pain.  Currently, she has pain in the right buttock, as well as the right lateral thigh.  It is radiating distally.  This is been ongoing for a little over a week.  She has been to the emergency department, as well as an urgent care center.  She has been taking Flexeril, as well as oxycodone.  Her symptoms overall are improving, but she still has pain.  She describes the pain is over the lateral thigh, extending distal to the right knee.  No prior injury.  This has not happened before.  she states she does have some neuropathy in the lateral right foot.   Review of Systems: No fevers or chills No  numbness or tingling No chest pain No shortness of breath No bowel or bladder dysfunction No GI distress No headaches   Medical History:  Past Medical History:  Diagnosis Date   Arthritis    Erosive esophagitis    GERD (gastroesophageal reflux disease)    HTN (hypertension)    Internal hemorrhoids 12/02/2002    Past Surgical History:  Procedure Laterality Date   APPENDECTOMY     BREAST LUMPECTOMY WITH RADIOFREQUENCY TAG IDENTIFICATION Left 11/01/2021   Procedure: BREAST LUMPECTOMY WITH RADIOFREQUENCY TAG IDENTIFICATION;  Surgeon: Lewie Chamber, DO;  Location: [facility];  Service: General;  Laterality: Left;   BUNIONECTOMY     bilateral x5   COLONOSCOPY  10/07/2012   RMR: Colorectal polyps-treated and/or removed as described above. Colonic diverticulosis.(serrated adenomas) next TCS 5 years   COLONOSCOPY W/ BIOPSIES  12/08/2002   RMR: Internal hemorrhoids, otherwise normal rectum Polyp at endocecum cold biopsied/removed Abnormal appearing ileocecal valve (of doubtful clinical significance)  biopsy showed chronic active colitis with significant lymphocytic infiltrate   ESOPHAGOGASTRODUODENOSCOPY  04/28/2007   RMR: Distal esophageal erosions consistent with mild to moderate  reflux esophagitis, otherwise, normal esophagus, small hiatal hernia; otherwise, normal stomach, first and second duodenum   EXCISION OF BREAST BIOPSY Left 11/01/2021   Procedure: EXCISION OF BREAST BIOPSY;  Surgeon: Lewie Chamber, DO;  Location: [facility];  Service: General;  Laterality: Left;   HEMORRHOID SURGERY     KIDNEY STONE SURGERY     SHOULDER SURGERY     left    Family History  Problem Relation Age of Onset   CAD Mother        deceased age 30   Pneumonia Father        deceased age 64/also with h/o CVA   Colon cancer Neg Hx    Social History   Tobacco Use   Smoking status: Former    Packs/day: 1.00    Years: 15.00    Additional pack years: 0.00    Total pack years: 15.00     Types: Cigarettes   Smokeless tobacco: Never  Vaping Use   Vaping Use: Never used  Substance Use Topics   Alcohol use: No   Drug use: No    Allergies  Allergen Reactions   Naproxen     Swelling or itching?   Piroxicam     Swelling or itching?    Current Meds  Medication Sig   cyclobenzaprine (FLEXERIL) 10 MG tablet Take 1 tablet (10 mg total) by mouth 2 (two) times daily as needed.   predniSONE (STERAPRED UNI-PAK 21 TAB) 10 MG (21) TBPK tablet 10 mg DS 12 as directed    Objective: BP (!) 140/87   Pulse 89   Ht 5' 5.5" (1.664 m)   Wt 183 lb (83 kg)   BMI 29.99 kg/m   Physical Exam:  General: Elderly female., Alert and oriented., and No acute distress. Gait: Right sided antalgic gait.  Evaluation of lower back demonstrates no deformity.  No bruising.  She does have some mild tenderness within the right buttock.  Negative straight leg raise.  Decree sensation to the lesser toes.  Sensation is normal over the lower leg.  She has tenderness palpation over the greater trochanter on the right.  She has smooth and painless range of motion with internal rotation and external rotation of the right hip.  IMAGING: I personally ordered and reviewed the following images  Standing lumbar spine x-rays were obtained in clinic today.  No acute injuries are noted.  No anterolisthesis.  She has diffuse degenerative changes, including osteophytes throughout.  No bony lesions.  Impression: Lumbar spine x-rays with diffuse degenerative changes.   New Medications:  Meds ordered this encounter  Medications   predniSONE (STERAPRED UNI-PAK 21 TAB) 10 MG (21) TBPK tablet    Sig: 10 mg DS 12 as directed    Dispense:  48 tablet    Refill:  0   cyclobenzaprine (FLEXERIL) 10 MG tablet    Sig: Take 1 tablet (10 mg total) by mouth 2 (two) times daily as needed.    Dispense:  20 tablet    Refill:  0      Oliver Barre, MD  03/31/2023 11:33 AM

## 2023-04-02 DIAGNOSIS — K219 Gastro-esophageal reflux disease without esophagitis: Secondary | ICD-10-CM | POA: Diagnosis not present

## 2023-04-02 DIAGNOSIS — G510 Bell's palsy: Secondary | ICD-10-CM | POA: Diagnosis not present

## 2023-04-02 DIAGNOSIS — Z79899 Other long term (current) drug therapy: Secondary | ICD-10-CM | POA: Diagnosis not present

## 2023-04-02 DIAGNOSIS — N1832 Chronic kidney disease, stage 3b: Secondary | ICD-10-CM | POA: Diagnosis not present

## 2023-04-02 DIAGNOSIS — I10 Essential (primary) hypertension: Secondary | ICD-10-CM | POA: Diagnosis not present

## 2023-04-09 DIAGNOSIS — Z0001 Encounter for general adult medical examination with abnormal findings: Secondary | ICD-10-CM | POA: Diagnosis not present

## 2023-04-09 DIAGNOSIS — I10 Essential (primary) hypertension: Secondary | ICD-10-CM | POA: Diagnosis not present

## 2023-04-09 DIAGNOSIS — K219 Gastro-esophageal reflux disease without esophagitis: Secondary | ICD-10-CM | POA: Diagnosis not present

## 2023-04-09 DIAGNOSIS — M5416 Radiculopathy, lumbar region: Secondary | ICD-10-CM | POA: Diagnosis not present

## 2023-04-09 DIAGNOSIS — N184 Chronic kidney disease, stage 4 (severe): Secondary | ICD-10-CM | POA: Diagnosis not present

## 2023-04-15 ENCOUNTER — Other Ambulatory Visit (HOSPITAL_COMMUNITY): Payer: Self-pay | Admitting: Internal Medicine

## 2023-04-15 DIAGNOSIS — N644 Mastodynia: Secondary | ICD-10-CM

## 2023-04-23 ENCOUNTER — Ambulatory Visit (HOSPITAL_COMMUNITY)
Admission: RE | Admit: 2023-04-23 | Discharge: 2023-04-23 | Disposition: A | Discharge status: Home / Self Care | Payer: HMO | Source: Ambulatory Visit | Attending: Internal Medicine | Admitting: Internal Medicine

## 2023-04-23 ENCOUNTER — Encounter (HOSPITAL_COMMUNITY): Payer: Self-pay

## 2023-04-23 DIAGNOSIS — N644 Mastodynia: Secondary | ICD-10-CM | POA: Diagnosis not present

## 2023-04-23 DIAGNOSIS — R92323 Mammographic fibroglandular density, bilateral breasts: Secondary | ICD-10-CM | POA: Diagnosis not present

## 2023-07-07 DIAGNOSIS — I10 Essential (primary) hypertension: Secondary | ICD-10-CM | POA: Diagnosis not present

## 2023-07-07 DIAGNOSIS — K219 Gastro-esophageal reflux disease without esophagitis: Secondary | ICD-10-CM | POA: Diagnosis not present

## 2023-07-07 DIAGNOSIS — Z79899 Other long term (current) drug therapy: Secondary | ICD-10-CM | POA: Diagnosis not present

## 2023-07-07 DIAGNOSIS — H524 Presbyopia: Secondary | ICD-10-CM | POA: Diagnosis not present

## 2023-07-07 DIAGNOSIS — N200 Calculus of kidney: Secondary | ICD-10-CM | POA: Diagnosis not present

## 2023-07-07 DIAGNOSIS — N184 Chronic kidney disease, stage 4 (severe): Secondary | ICD-10-CM | POA: Diagnosis not present

## 2023-07-07 DIAGNOSIS — H43813 Vitreous degeneration, bilateral: Secondary | ICD-10-CM | POA: Diagnosis not present

## 2023-07-14 DIAGNOSIS — N184 Chronic kidney disease, stage 4 (severe): Secondary | ICD-10-CM | POA: Diagnosis not present

## 2023-07-14 DIAGNOSIS — N390 Urinary tract infection, site not specified: Secondary | ICD-10-CM | POA: Diagnosis not present

## 2023-07-14 DIAGNOSIS — I10 Essential (primary) hypertension: Secondary | ICD-10-CM | POA: Diagnosis not present

## 2023-07-15 ENCOUNTER — Other Ambulatory Visit (HOSPITAL_COMMUNITY): Payer: Self-pay | Admitting: Internal Medicine

## 2023-07-15 ENCOUNTER — Encounter (HOSPITAL_COMMUNITY): Payer: Self-pay | Admitting: Internal Medicine

## 2023-07-15 DIAGNOSIS — Z23 Encounter for immunization: Secondary | ICD-10-CM | POA: Diagnosis not present

## 2023-07-15 DIAGNOSIS — R7989 Other specified abnormal findings of blood chemistry: Secondary | ICD-10-CM

## 2023-07-29 DIAGNOSIS — M79671 Pain in right foot: Secondary | ICD-10-CM | POA: Diagnosis not present

## 2023-07-29 DIAGNOSIS — M79672 Pain in left foot: Secondary | ICD-10-CM | POA: Diagnosis not present

## 2023-07-29 DIAGNOSIS — G600 Hereditary motor and sensory neuropathy: Secondary | ICD-10-CM | POA: Diagnosis not present

## 2023-07-29 DIAGNOSIS — I739 Peripheral vascular disease, unspecified: Secondary | ICD-10-CM | POA: Diagnosis not present

## 2023-08-07 ENCOUNTER — Ambulatory Visit (HOSPITAL_COMMUNITY)
Admission: RE | Admit: 2023-08-07 | Discharge: 2023-08-07 | Disposition: A | Discharge status: Home / Self Care | Payer: HMO | Source: Ambulatory Visit | Attending: Internal Medicine | Admitting: Internal Medicine

## 2023-08-07 DIAGNOSIS — I878 Other specified disorders of veins: Secondary | ICD-10-CM | POA: Diagnosis not present

## 2023-08-07 DIAGNOSIS — R7989 Other specified abnormal findings of blood chemistry: Secondary | ICD-10-CM | POA: Insufficient documentation

## 2023-08-26 ENCOUNTER — Other Ambulatory Visit (HOSPITAL_COMMUNITY): Payer: Self-pay | Admitting: Internal Medicine

## 2023-08-26 DIAGNOSIS — R7989 Other specified abnormal findings of blood chemistry: Secondary | ICD-10-CM

## 2023-09-03 ENCOUNTER — Ambulatory Visit (HOSPITAL_COMMUNITY): Payer: HMO

## 2023-09-10 ENCOUNTER — Ambulatory Visit (HOSPITAL_COMMUNITY)
Admission: RE | Admit: 2023-09-10 | Discharge: 2023-09-10 | Disposition: A | Discharge status: Home / Self Care | Payer: HMO | Source: Ambulatory Visit | Attending: Internal Medicine | Admitting: Internal Medicine

## 2023-09-10 DIAGNOSIS — R7989 Other specified abnormal findings of blood chemistry: Secondary | ICD-10-CM | POA: Insufficient documentation

## 2023-09-10 DIAGNOSIS — N2889 Other specified disorders of kidney and ureter: Secondary | ICD-10-CM | POA: Insufficient documentation

## 2023-09-24 ENCOUNTER — Ambulatory Visit (HOSPITAL_COMMUNITY)
Admission: RE | Admit: 2023-09-24 | Discharge: 2023-09-24 | Disposition: A | Discharge status: Home / Self Care | Payer: HMO | Source: Ambulatory Visit | Attending: Internal Medicine | Admitting: Internal Medicine

## 2023-09-24 ENCOUNTER — Other Ambulatory Visit (HOSPITAL_COMMUNITY): Payer: Self-pay | Admitting: Internal Medicine

## 2023-09-24 DIAGNOSIS — M79661 Pain in right lower leg: Secondary | ICD-10-CM | POA: Diagnosis not present

## 2023-09-24 DIAGNOSIS — M1711 Unilateral primary osteoarthritis, right knee: Secondary | ICD-10-CM | POA: Diagnosis not present

## 2023-09-24 DIAGNOSIS — M25861 Other specified joint disorders, right knee: Secondary | ICD-10-CM | POA: Diagnosis not present

## 2023-09-24 DIAGNOSIS — R52 Pain, unspecified: Secondary | ICD-10-CM

## 2023-09-24 DIAGNOSIS — M2580 Other specified joint disorders, unspecified joint: Secondary | ICD-10-CM | POA: Diagnosis not present

## 2023-09-25 DIAGNOSIS — M7060 Trochanteric bursitis, unspecified hip: Secondary | ICD-10-CM | POA: Diagnosis not present

## 2023-10-05 ENCOUNTER — Encounter: Payer: HMO | Admitting: Orthopedic Surgery

## 2023-10-22 DIAGNOSIS — I10 Essential (primary) hypertension: Secondary | ICD-10-CM | POA: Diagnosis not present

## 2023-10-22 DIAGNOSIS — Z79899 Other long term (current) drug therapy: Secondary | ICD-10-CM | POA: Diagnosis not present

## 2023-10-22 DIAGNOSIS — N184 Chronic kidney disease, stage 4 (severe): Secondary | ICD-10-CM | POA: Diagnosis not present

## 2023-10-22 DIAGNOSIS — R7301 Impaired fasting glucose: Secondary | ICD-10-CM | POA: Diagnosis not present

## 2023-10-22 DIAGNOSIS — E785 Hyperlipidemia, unspecified: Secondary | ICD-10-CM | POA: Diagnosis not present

## 2023-10-29 DIAGNOSIS — J189 Pneumonia, unspecified organism: Secondary | ICD-10-CM | POA: Diagnosis not present

## 2023-10-29 DIAGNOSIS — N183 Chronic kidney disease, stage 3 unspecified: Secondary | ICD-10-CM | POA: Diagnosis not present

## 2023-10-29 DIAGNOSIS — I10 Essential (primary) hypertension: Secondary | ICD-10-CM | POA: Diagnosis not present

## 2023-12-16 DIAGNOSIS — I739 Peripheral vascular disease, unspecified: Secondary | ICD-10-CM | POA: Diagnosis not present

## 2023-12-16 DIAGNOSIS — M79671 Pain in right foot: Secondary | ICD-10-CM | POA: Diagnosis not present

## 2023-12-16 DIAGNOSIS — G600 Hereditary motor and sensory neuropathy: Secondary | ICD-10-CM | POA: Diagnosis not present

## 2023-12-16 DIAGNOSIS — M79672 Pain in left foot: Secondary | ICD-10-CM | POA: Diagnosis not present

## 2024-01-21 DIAGNOSIS — H40013 Open angle with borderline findings, low risk, bilateral: Secondary | ICD-10-CM | POA: Diagnosis not present

## 2024-03-22 DIAGNOSIS — Z85828 Personal history of other malignant neoplasm of skin: Secondary | ICD-10-CM | POA: Diagnosis not present

## 2024-03-22 DIAGNOSIS — D1801 Hemangioma of skin and subcutaneous tissue: Secondary | ICD-10-CM | POA: Diagnosis not present

## 2024-03-22 DIAGNOSIS — L570 Actinic keratosis: Secondary | ICD-10-CM | POA: Diagnosis not present

## 2024-03-22 DIAGNOSIS — I781 Nevus, non-neoplastic: Secondary | ICD-10-CM | POA: Diagnosis not present

## 2024-04-04 DIAGNOSIS — N184 Chronic kidney disease, stage 4 (severe): Secondary | ICD-10-CM | POA: Diagnosis not present

## 2024-04-04 DIAGNOSIS — I10 Essential (primary) hypertension: Secondary | ICD-10-CM | POA: Diagnosis not present

## 2024-04-04 DIAGNOSIS — G629 Polyneuropathy, unspecified: Secondary | ICD-10-CM | POA: Diagnosis not present

## 2024-04-04 DIAGNOSIS — Z79899 Other long term (current) drug therapy: Secondary | ICD-10-CM | POA: Diagnosis not present

## 2024-04-08 ENCOUNTER — Other Ambulatory Visit (HOSPITAL_COMMUNITY): Payer: Self-pay | Admitting: Internal Medicine

## 2024-04-08 DIAGNOSIS — Z1231 Encounter for screening mammogram for malignant neoplasm of breast: Secondary | ICD-10-CM

## 2024-04-11 DIAGNOSIS — N1832 Chronic kidney disease, stage 3b: Secondary | ICD-10-CM | POA: Diagnosis not present

## 2024-04-11 DIAGNOSIS — K219 Gastro-esophageal reflux disease without esophagitis: Secondary | ICD-10-CM | POA: Diagnosis not present

## 2024-04-11 DIAGNOSIS — I10 Essential (primary) hypertension: Secondary | ICD-10-CM | POA: Diagnosis not present

## 2024-04-11 DIAGNOSIS — G629 Polyneuropathy, unspecified: Secondary | ICD-10-CM | POA: Diagnosis not present

## 2024-04-11 DIAGNOSIS — Z0001 Encounter for general adult medical examination with abnormal findings: Secondary | ICD-10-CM | POA: Diagnosis not present

## 2024-04-11 DIAGNOSIS — E785 Hyperlipidemia, unspecified: Secondary | ICD-10-CM | POA: Diagnosis not present

## 2024-04-27 ENCOUNTER — Ambulatory Visit (HOSPITAL_COMMUNITY)
Admission: RE | Admit: 2024-04-27 | Discharge: 2024-04-27 | Disposition: A | Discharge status: Home / Self Care | Source: Ambulatory Visit | Attending: Internal Medicine | Admitting: Internal Medicine

## 2024-04-27 DIAGNOSIS — Z1231 Encounter for screening mammogram for malignant neoplasm of breast: Secondary | ICD-10-CM | POA: Insufficient documentation

## 2024-05-11 DIAGNOSIS — Z79899 Other long term (current) drug therapy: Secondary | ICD-10-CM | POA: Diagnosis not present

## 2024-05-11 DIAGNOSIS — I10 Essential (primary) hypertension: Secondary | ICD-10-CM | POA: Diagnosis not present

## 2024-06-01 DIAGNOSIS — M722 Plantar fascial fibromatosis: Secondary | ICD-10-CM | POA: Diagnosis not present

## 2024-06-16 DIAGNOSIS — M722 Plantar fascial fibromatosis: Secondary | ICD-10-CM | POA: Diagnosis not present

## 2024-06-20 DIAGNOSIS — M722 Plantar fascial fibromatosis: Secondary | ICD-10-CM | POA: Diagnosis not present

## 2024-06-23 DIAGNOSIS — M722 Plantar fascial fibromatosis: Secondary | ICD-10-CM | POA: Diagnosis not present

## 2024-06-27 DIAGNOSIS — M722 Plantar fascial fibromatosis: Secondary | ICD-10-CM | POA: Diagnosis not present

## 2024-06-29 DIAGNOSIS — M722 Plantar fascial fibromatosis: Secondary | ICD-10-CM | POA: Diagnosis not present

## 2024-07-04 DIAGNOSIS — M722 Plantar fascial fibromatosis: Secondary | ICD-10-CM | POA: Diagnosis not present

## 2024-07-06 DIAGNOSIS — M722 Plantar fascial fibromatosis: Secondary | ICD-10-CM | POA: Diagnosis not present

## 2024-07-12 DIAGNOSIS — M722 Plantar fascial fibromatosis: Secondary | ICD-10-CM | POA: Diagnosis not present

## 2024-07-13 DIAGNOSIS — M7661 Achilles tendinitis, right leg: Secondary | ICD-10-CM | POA: Diagnosis not present

## 2024-07-13 DIAGNOSIS — M722 Plantar fascial fibromatosis: Secondary | ICD-10-CM | POA: Diagnosis not present

## 2024-07-13 DIAGNOSIS — M79671 Pain in right foot: Secondary | ICD-10-CM | POA: Diagnosis not present

## 2024-07-19 DIAGNOSIS — H524 Presbyopia: Secondary | ICD-10-CM | POA: Diagnosis not present

## 2024-07-19 DIAGNOSIS — H35362 Drusen (degenerative) of macula, left eye: Secondary | ICD-10-CM | POA: Diagnosis not present

## 2024-08-03 DIAGNOSIS — I10 Essential (primary) hypertension: Secondary | ICD-10-CM | POA: Diagnosis not present

## 2024-08-03 DIAGNOSIS — R7301 Impaired fasting glucose: Secondary | ICD-10-CM | POA: Diagnosis not present

## 2024-08-03 DIAGNOSIS — N184 Chronic kidney disease, stage 4 (severe): Secondary | ICD-10-CM | POA: Diagnosis not present

## 2024-08-03 DIAGNOSIS — Z79899 Other long term (current) drug therapy: Secondary | ICD-10-CM | POA: Diagnosis not present

## 2024-08-10 DIAGNOSIS — Z23 Encounter for immunization: Secondary | ICD-10-CM | POA: Diagnosis not present

## 2024-08-10 DIAGNOSIS — G629 Polyneuropathy, unspecified: Secondary | ICD-10-CM | POA: Diagnosis not present

## 2024-08-10 DIAGNOSIS — N183 Chronic kidney disease, stage 3 unspecified: Secondary | ICD-10-CM | POA: Diagnosis not present

## 2024-08-10 DIAGNOSIS — I10 Essential (primary) hypertension: Secondary | ICD-10-CM | POA: Diagnosis not present

## 2024-08-23 DIAGNOSIS — M79675 Pain in left toe(s): Secondary | ICD-10-CM | POA: Diagnosis not present

## 2024-08-23 DIAGNOSIS — I739 Peripheral vascular disease, unspecified: Secondary | ICD-10-CM | POA: Diagnosis not present

## 2024-08-23 DIAGNOSIS — M25775 Osteophyte, left foot: Secondary | ICD-10-CM | POA: Diagnosis not present

## 2024-08-23 DIAGNOSIS — M79674 Pain in right toe(s): Secondary | ICD-10-CM | POA: Diagnosis not present

## 2024-08-23 DIAGNOSIS — M79671 Pain in right foot: Secondary | ICD-10-CM | POA: Diagnosis not present

## 2024-08-23 DIAGNOSIS — M79672 Pain in left foot: Secondary | ICD-10-CM | POA: Diagnosis not present

## 2024-08-23 DIAGNOSIS — L110 Acquired keratosis follicularis: Secondary | ICD-10-CM | POA: Diagnosis not present
